# Patient Record
Sex: Female | Born: 1940 | Race: White | Hispanic: No | Marital: Married | Smoking: Former smoker
Health system: Southern US, Community
[De-identification: ages and names within clinical notes are randomized; demographics above are authoritative.]

## PROBLEM LIST (undated history)

## (undated) DIAGNOSIS — E7800 Pure hypercholesterolemia, unspecified: Secondary | ICD-10-CM

## (undated) DIAGNOSIS — N390 Urinary tract infection, site not specified: Secondary | ICD-10-CM

## (undated) DIAGNOSIS — R519 Headache, unspecified: Secondary | ICD-10-CM

## (undated) DIAGNOSIS — I447 Left bundle-branch block, unspecified: Secondary | ICD-10-CM

## (undated) DIAGNOSIS — E119 Type 2 diabetes mellitus without complications: Secondary | ICD-10-CM

## (undated) DIAGNOSIS — Z950 Presence of cardiac pacemaker: Secondary | ICD-10-CM

## (undated) DIAGNOSIS — E785 Hyperlipidemia, unspecified: Secondary | ICD-10-CM

## (undated) DIAGNOSIS — N189 Chronic kidney disease, unspecified: Secondary | ICD-10-CM

## (undated) DIAGNOSIS — K219 Gastro-esophageal reflux disease without esophagitis: Secondary | ICD-10-CM

## (undated) DIAGNOSIS — I480 Paroxysmal atrial fibrillation: Secondary | ICD-10-CM

## (undated) DIAGNOSIS — I10 Essential (primary) hypertension: Secondary | ICD-10-CM

## (undated) DIAGNOSIS — D0512 Intraductal carcinoma in situ of left breast: Secondary | ICD-10-CM

## (undated) HISTORY — DX: Intraductal carcinoma in situ of left breast: D05.12

## (undated) HISTORY — DX: Type 2 diabetes mellitus without complications: E11.9

## (undated) HISTORY — DX: Urinary tract infection, site not specified: N39.0

## (undated) HISTORY — DX: Left bundle-branch block, unspecified: I44.7

## (undated) HISTORY — DX: Pure hypercholesterolemia, unspecified: E78.00

## (undated) HISTORY — PX: CATARACT EXTRACTION: SUR2

## (undated) HISTORY — DX: Presence of cardiac pacemaker: Z95.0

## (undated) HISTORY — PX: BREAST EXCISIONAL BIOPSY: SUR124

## (undated) HISTORY — DX: Hyperlipidemia, unspecified: E78.5

## (undated) HISTORY — DX: Headache, unspecified: R51.9

## (undated) HISTORY — PX: BREAST LUMPECTOMY: SHX2

## (undated) HISTORY — DX: Chronic kidney disease, unspecified: N18.9

## (undated) HISTORY — PX: ABDOMINAL HYSTERECTOMY: SHX81

## (undated) HISTORY — DX: Essential (primary) hypertension: I10

## (undated) HISTORY — PX: OTHER SURGICAL HISTORY: SHX169

---

## 1999-11-10 ENCOUNTER — Encounter: Admission: RE | Admit: 1999-11-10 | Discharge: 1999-11-10 | Payer: Self-pay | Admitting: Family Medicine

## 1999-11-10 ENCOUNTER — Encounter: Payer: Self-pay | Admitting: Family Medicine

## 2000-11-12 ENCOUNTER — Encounter: Admission: RE | Admit: 2000-11-12 | Discharge: 2000-11-12 | Payer: Self-pay | Admitting: Family Medicine

## 2000-11-12 ENCOUNTER — Encounter: Payer: Self-pay | Admitting: Family Medicine

## 2004-08-14 ENCOUNTER — Other Ambulatory Visit: Admission: RE | Admit: 2004-08-14 | Discharge: 2004-08-14 | Payer: Self-pay | Admitting: Family Medicine

## 2005-04-06 ENCOUNTER — Encounter: Admission: RE | Admit: 2005-04-06 | Discharge: 2005-04-06 | Payer: Self-pay | Admitting: Family Medicine

## 2006-04-10 ENCOUNTER — Encounter: Admission: RE | Admit: 2006-04-10 | Discharge: 2006-04-10 | Payer: Self-pay | Admitting: Family Medicine

## 2007-04-14 ENCOUNTER — Encounter: Admission: RE | Admit: 2007-04-14 | Discharge: 2007-04-14 | Payer: Self-pay | Admitting: Family Medicine

## 2007-12-15 ENCOUNTER — Emergency Department (HOSPITAL_COMMUNITY): Admission: EM | Admit: 2007-12-15 | Discharge: 2007-12-15 | Payer: Self-pay | Admitting: Emergency Medicine

## 2008-04-14 ENCOUNTER — Encounter: Admission: RE | Admit: 2008-04-14 | Discharge: 2008-04-14 | Payer: Self-pay | Admitting: Family Medicine

## 2009-04-18 ENCOUNTER — Encounter: Admission: RE | Admit: 2009-04-18 | Discharge: 2009-04-18 | Payer: Self-pay | Admitting: Family Medicine

## 2010-04-19 ENCOUNTER — Encounter: Admission: RE | Admit: 2010-04-19 | Discharge: 2010-04-19 | Payer: Self-pay | Admitting: Family Medicine

## 2011-03-19 ENCOUNTER — Other Ambulatory Visit: Payer: Self-pay | Admitting: Family Medicine

## 2011-03-19 DIAGNOSIS — Z1231 Encounter for screening mammogram for malignant neoplasm of breast: Secondary | ICD-10-CM

## 2011-04-30 ENCOUNTER — Ambulatory Visit
Admission: RE | Admit: 2011-04-30 | Discharge: 2011-04-30 | Disposition: A | Discharge status: Home / Self Care | Payer: Medicare Other | Source: Ambulatory Visit | Attending: Family Medicine | Admitting: Family Medicine

## 2011-04-30 DIAGNOSIS — Z1231 Encounter for screening mammogram for malignant neoplasm of breast: Secondary | ICD-10-CM

## 2012-04-01 ENCOUNTER — Other Ambulatory Visit: Payer: Self-pay | Admitting: Family Medicine

## 2012-04-01 DIAGNOSIS — Z1231 Encounter for screening mammogram for malignant neoplasm of breast: Secondary | ICD-10-CM

## 2012-04-01 DIAGNOSIS — Z853 Personal history of malignant neoplasm of breast: Secondary | ICD-10-CM

## 2012-04-30 ENCOUNTER — Ambulatory Visit
Admission: RE | Admit: 2012-04-30 | Discharge: 2012-04-30 | Disposition: A | Discharge status: Home / Self Care | Payer: Medicare Other | Source: Ambulatory Visit | Attending: Family Medicine | Admitting: Family Medicine

## 2012-04-30 DIAGNOSIS — Z853 Personal history of malignant neoplasm of breast: Secondary | ICD-10-CM

## 2012-04-30 DIAGNOSIS — Z1231 Encounter for screening mammogram for malignant neoplasm of breast: Secondary | ICD-10-CM

## 2013-03-24 ENCOUNTER — Other Ambulatory Visit: Payer: Self-pay

## 2013-03-24 DIAGNOSIS — Z1231 Encounter for screening mammogram for malignant neoplasm of breast: Secondary | ICD-10-CM

## 2013-05-01 ENCOUNTER — Ambulatory Visit
Admission: RE | Admit: 2013-05-01 | Discharge: 2013-05-01 | Disposition: A | Discharge status: Home / Self Care | Payer: Medicare Other | Source: Ambulatory Visit

## 2013-05-01 DIAGNOSIS — Z1231 Encounter for screening mammogram for malignant neoplasm of breast: Secondary | ICD-10-CM

## 2014-03-10 ENCOUNTER — Other Ambulatory Visit: Payer: Self-pay

## 2014-03-10 DIAGNOSIS — Z1231 Encounter for screening mammogram for malignant neoplasm of breast: Secondary | ICD-10-CM

## 2014-05-12 ENCOUNTER — Ambulatory Visit
Admission: RE | Admit: 2014-05-12 | Discharge: 2014-05-12 | Disposition: A | Discharge status: Home / Self Care | Payer: Commercial Managed Care - HMO | Source: Ambulatory Visit

## 2014-05-12 DIAGNOSIS — Z1231 Encounter for screening mammogram for malignant neoplasm of breast: Secondary | ICD-10-CM

## 2015-04-04 ENCOUNTER — Other Ambulatory Visit: Payer: Self-pay

## 2015-04-04 DIAGNOSIS — Z1231 Encounter for screening mammogram for malignant neoplasm of breast: Secondary | ICD-10-CM

## 2015-05-01 DIAGNOSIS — N342 Other urethritis: Secondary | ICD-10-CM | POA: Diagnosis not present

## 2015-05-01 DIAGNOSIS — R002 Palpitations: Secondary | ICD-10-CM | POA: Diagnosis not present

## 2015-05-01 DIAGNOSIS — R112 Nausea with vomiting, unspecified: Secondary | ICD-10-CM | POA: Diagnosis not present

## 2015-05-01 DIAGNOSIS — E785 Hyperlipidemia, unspecified: Secondary | ICD-10-CM | POA: Diagnosis not present

## 2015-05-01 DIAGNOSIS — N390 Urinary tract infection, site not specified: Secondary | ICD-10-CM | POA: Diagnosis not present

## 2015-05-01 DIAGNOSIS — I10 Essential (primary) hypertension: Secondary | ICD-10-CM | POA: Diagnosis not present

## 2015-05-16 ENCOUNTER — Ambulatory Visit
Admission: RE | Admit: 2015-05-16 | Discharge: 2015-05-16 | Disposition: A | Discharge status: Home / Self Care | Payer: Medicare Other | Source: Ambulatory Visit

## 2015-05-16 DIAGNOSIS — Z1231 Encounter for screening mammogram for malignant neoplasm of breast: Secondary | ICD-10-CM | POA: Diagnosis not present

## 2015-05-27 DIAGNOSIS — E782 Mixed hyperlipidemia: Secondary | ICD-10-CM | POA: Diagnosis not present

## 2015-05-27 DIAGNOSIS — I10 Essential (primary) hypertension: Secondary | ICD-10-CM | POA: Diagnosis not present

## 2015-05-27 DIAGNOSIS — Z23 Encounter for immunization: Secondary | ICD-10-CM | POA: Diagnosis not present

## 2015-05-27 DIAGNOSIS — M810 Age-related osteoporosis without current pathological fracture: Secondary | ICD-10-CM | POA: Diagnosis not present

## 2015-05-27 DIAGNOSIS — E559 Vitamin D deficiency, unspecified: Secondary | ICD-10-CM | POA: Diagnosis not present

## 2015-05-27 DIAGNOSIS — R7301 Impaired fasting glucose: Secondary | ICD-10-CM | POA: Diagnosis not present

## 2015-05-27 DIAGNOSIS — N390 Urinary tract infection, site not specified: Secondary | ICD-10-CM | POA: Diagnosis not present

## 2015-05-27 DIAGNOSIS — K219 Gastro-esophageal reflux disease without esophagitis: Secondary | ICD-10-CM | POA: Diagnosis not present

## 2015-06-28 DIAGNOSIS — M810 Age-related osteoporosis without current pathological fracture: Secondary | ICD-10-CM | POA: Diagnosis not present

## 2015-07-04 DIAGNOSIS — D3132 Benign neoplasm of left choroid: Secondary | ICD-10-CM | POA: Diagnosis not present

## 2015-07-04 DIAGNOSIS — H5203 Hypermetropia, bilateral: Secondary | ICD-10-CM | POA: Diagnosis not present

## 2015-07-04 DIAGNOSIS — H04121 Dry eye syndrome of right lacrimal gland: Secondary | ICD-10-CM | POA: Diagnosis not present

## 2015-07-04 DIAGNOSIS — H43813 Vitreous degeneration, bilateral: Secondary | ICD-10-CM | POA: Diagnosis not present

## 2016-04-09 ENCOUNTER — Other Ambulatory Visit: Payer: Self-pay | Admitting: Family Medicine

## 2016-04-09 DIAGNOSIS — Z1231 Encounter for screening mammogram for malignant neoplasm of breast: Secondary | ICD-10-CM

## 2016-04-17 DIAGNOSIS — R220 Localized swelling, mass and lump, head: Secondary | ICD-10-CM | POA: Diagnosis not present

## 2016-04-17 DIAGNOSIS — L7211 Pilar cyst: Secondary | ICD-10-CM | POA: Insufficient documentation

## 2016-05-21 ENCOUNTER — Ambulatory Visit
Admission: RE | Admit: 2016-05-21 | Discharge: 2016-05-21 | Disposition: A | Discharge status: Home / Self Care | Payer: Medicare Other | Source: Ambulatory Visit | Attending: Family Medicine | Admitting: Family Medicine

## 2016-05-21 DIAGNOSIS — Z1231 Encounter for screening mammogram for malignant neoplasm of breast: Secondary | ICD-10-CM

## 2016-05-22 DIAGNOSIS — L720 Epidermal cyst: Secondary | ICD-10-CM | POA: Diagnosis not present

## 2016-05-23 DIAGNOSIS — L720 Epidermal cyst: Secondary | ICD-10-CM | POA: Diagnosis not present

## 2016-05-23 DIAGNOSIS — L729 Follicular cyst of the skin and subcutaneous tissue, unspecified: Secondary | ICD-10-CM | POA: Diagnosis not present

## 2016-05-25 DIAGNOSIS — E559 Vitamin D deficiency, unspecified: Secondary | ICD-10-CM | POA: Diagnosis not present

## 2016-05-25 DIAGNOSIS — I10 Essential (primary) hypertension: Secondary | ICD-10-CM | POA: Diagnosis not present

## 2016-05-25 DIAGNOSIS — E782 Mixed hyperlipidemia: Secondary | ICD-10-CM | POA: Diagnosis not present

## 2016-05-25 DIAGNOSIS — M79671 Pain in right foot: Secondary | ICD-10-CM | POA: Diagnosis not present

## 2016-05-25 DIAGNOSIS — M810 Age-related osteoporosis without current pathological fracture: Secondary | ICD-10-CM | POA: Diagnosis not present

## 2016-05-25 DIAGNOSIS — R7301 Impaired fasting glucose: Secondary | ICD-10-CM | POA: Diagnosis not present

## 2016-05-25 DIAGNOSIS — Z23 Encounter for immunization: Secondary | ICD-10-CM | POA: Diagnosis not present

## 2016-05-25 DIAGNOSIS — K219 Gastro-esophageal reflux disease without esophagitis: Secondary | ICD-10-CM | POA: Diagnosis not present

## 2016-06-19 DIAGNOSIS — Z4802 Encounter for removal of sutures: Secondary | ICD-10-CM | POA: Diagnosis not present

## 2016-08-02 DIAGNOSIS — H524 Presbyopia: Secondary | ICD-10-CM | POA: Diagnosis not present

## 2016-08-02 DIAGNOSIS — H2513 Age-related nuclear cataract, bilateral: Secondary | ICD-10-CM | POA: Diagnosis not present

## 2016-08-02 DIAGNOSIS — D3132 Benign neoplasm of left choroid: Secondary | ICD-10-CM | POA: Diagnosis not present

## 2016-08-02 DIAGNOSIS — H5203 Hypermetropia, bilateral: Secondary | ICD-10-CM | POA: Diagnosis not present

## 2016-08-02 DIAGNOSIS — H52203 Unspecified astigmatism, bilateral: Secondary | ICD-10-CM | POA: Diagnosis not present

## 2016-10-02 DIAGNOSIS — B079 Viral wart, unspecified: Secondary | ICD-10-CM | POA: Diagnosis not present

## 2016-10-02 DIAGNOSIS — D489 Neoplasm of uncertain behavior, unspecified: Secondary | ICD-10-CM | POA: Diagnosis not present

## 2016-10-02 DIAGNOSIS — H61002 Unspecified perichondritis of left external ear: Secondary | ICD-10-CM | POA: Diagnosis not present

## 2016-10-02 DIAGNOSIS — L570 Actinic keratosis: Secondary | ICD-10-CM | POA: Diagnosis not present

## 2016-10-02 DIAGNOSIS — L821 Other seborrheic keratosis: Secondary | ICD-10-CM | POA: Diagnosis not present

## 2017-04-16 ENCOUNTER — Other Ambulatory Visit: Payer: Self-pay | Admitting: Family Medicine

## 2017-04-16 DIAGNOSIS — Z1231 Encounter for screening mammogram for malignant neoplasm of breast: Secondary | ICD-10-CM

## 2017-05-23 ENCOUNTER — Ambulatory Visit
Admission: RE | Admit: 2017-05-23 | Discharge: 2017-05-23 | Disposition: A | Discharge status: Home / Self Care | Payer: PRIVATE HEALTH INSURANCE | Source: Ambulatory Visit | Attending: Family Medicine | Admitting: Family Medicine

## 2017-05-23 DIAGNOSIS — Z1231 Encounter for screening mammogram for malignant neoplasm of breast: Secondary | ICD-10-CM | POA: Diagnosis not present

## 2017-05-29 DIAGNOSIS — E663 Overweight: Secondary | ICD-10-CM | POA: Diagnosis not present

## 2017-05-29 DIAGNOSIS — Z23 Encounter for immunization: Secondary | ICD-10-CM | POA: Diagnosis not present

## 2017-05-29 DIAGNOSIS — E782 Mixed hyperlipidemia: Secondary | ICD-10-CM | POA: Diagnosis not present

## 2017-05-29 DIAGNOSIS — K219 Gastro-esophageal reflux disease without esophagitis: Secondary | ICD-10-CM | POA: Diagnosis not present

## 2017-05-29 DIAGNOSIS — R7301 Impaired fasting glucose: Secondary | ICD-10-CM | POA: Diagnosis not present

## 2017-05-29 DIAGNOSIS — I10 Essential (primary) hypertension: Secondary | ICD-10-CM | POA: Diagnosis not present

## 2017-05-29 DIAGNOSIS — M810 Age-related osteoporosis without current pathological fracture: Secondary | ICD-10-CM | POA: Diagnosis not present

## 2017-05-29 DIAGNOSIS — E559 Vitamin D deficiency, unspecified: Secondary | ICD-10-CM | POA: Diagnosis not present

## 2017-05-29 DIAGNOSIS — Z1211 Encounter for screening for malignant neoplasm of colon: Secondary | ICD-10-CM | POA: Diagnosis not present

## 2017-07-08 DIAGNOSIS — M810 Age-related osteoporosis without current pathological fracture: Secondary | ICD-10-CM | POA: Diagnosis not present

## 2017-08-20 ENCOUNTER — Encounter (HOSPITAL_COMMUNITY): Payer: Self-pay

## 2017-08-20 ENCOUNTER — Emergency Department (HOSPITAL_COMMUNITY): Payer: Medicare Other

## 2017-08-20 ENCOUNTER — Other Ambulatory Visit: Payer: Self-pay

## 2017-08-20 ENCOUNTER — Emergency Department (HOSPITAL_COMMUNITY)
Admission: EM | Admit: 2017-08-20 | Discharge: 2017-08-20 | Disposition: A | Discharge status: Home / Self Care | Payer: Medicare Other | Attending: Emergency Medicine | Admitting: Emergency Medicine

## 2017-08-20 DIAGNOSIS — R0789 Other chest pain: Secondary | ICD-10-CM | POA: Diagnosis not present

## 2017-08-20 DIAGNOSIS — Z79899 Other long term (current) drug therapy: Secondary | ICD-10-CM | POA: Insufficient documentation

## 2017-08-20 DIAGNOSIS — Z7982 Long term (current) use of aspirin: Secondary | ICD-10-CM | POA: Diagnosis not present

## 2017-08-20 DIAGNOSIS — R079 Chest pain, unspecified: Secondary | ICD-10-CM | POA: Diagnosis not present

## 2017-08-20 HISTORY — DX: Gastro-esophageal reflux disease without esophagitis: K21.9

## 2017-08-20 HISTORY — DX: Left bundle-branch block, unspecified: I44.7

## 2017-08-20 LAB — BASIC METABOLIC PANEL
ANION GAP: 9 (ref 5–15)
BUN: 15 mg/dL (ref 6–20)
CALCIUM: 9.8 mg/dL (ref 8.9–10.3)
CHLORIDE: 104 mmol/L (ref 101–111)
CO2: 23 mmol/L (ref 22–32)
Creatinine, Ser: 0.8 mg/dL (ref 0.44–1.00)
GFR calc non Af Amer: >60 mL/min (ref >60)
Glucose, Bld: 104 mg/dL — ABNORMAL HIGH (ref 65–99)
Potassium: 3.5 mmol/L (ref 3.5–5.1)
Sodium: 136 mmol/L (ref 135–145)

## 2017-08-20 LAB — CBC
HCT: 37.7 % (ref 36.0–46.0)
HEMOGLOBIN: 12.3 g/dL (ref 12.0–15.0)
MCH: 26.1 pg (ref 26.0–34.0)
MCHC: 32.6 g/dL (ref 30.0–36.0)
MCV: 79.9 fL (ref 78.0–100.0)
Platelets: 266 10*3/uL (ref 150–400)
RBC: 4.72 MIL/uL (ref 3.87–5.11)
RDW: 14.1 % (ref 11.5–15.5)
WBC: 7.5 10*3/uL (ref 4.0–10.5)

## 2017-08-20 LAB — I-STAT TROPONIN, ED
TROPONIN I, POC: 0.01 ng/mL (ref 0.00–0.08)
TROPONIN I, POC: 0.01 ng/mL (ref 0.00–0.08)

## 2017-08-20 MED ORDER — PANTOPRAZOLE SODIUM 20 MG PO TBEC: 20.0000 mg | DELAYED_RELEASE_TABLET | Freq: Every day | ORAL | 1 refills | Status: DC

## 2017-08-20 MED ORDER — PANTOPRAZOLE SODIUM 40 MG PO TBEC
40.0000 mg | DELAYED_RELEASE_TABLET | Freq: Once | ORAL | Status: AC
  Administered 2017-08-20: 40 mg via ORAL
  Filled 2017-08-20: qty 1

## 2017-08-20 MED ORDER — FAMOTIDINE 20 MG PO TABS
20.0000 mg | ORAL_TABLET | Freq: Once | ORAL | Status: AC
  Administered 2017-08-20: 20 mg via ORAL
  Filled 2017-08-20: qty 1

## 2017-08-20 MED ORDER — FAMOTIDINE 20 MG PO TABS: 20.0000 mg | ORAL_TABLET | Freq: Two times a day (BID) | ORAL | 0 refills | Status: DC

## 2017-08-20 NOTE — ED Triage Notes (Signed)
Pt states that since Saturday she has been having central chest burning with radiation to left area, nausea and some SOB. Hx of GERD

## 2017-08-20 NOTE — Discharge Instructions (Signed)
Your lab results today were encouraging.  Your symptoms may be due to acid reflux. Pantoprazole: Take the pantoprazole daily.  Take this medication 10-20 minutes prior to your first meal of the day. Pepcid: Take this medication twice a day for the next 5 days. Blood pressure: Your blood pressure was higher than normal today.  Please follow-up with your primary care provider on this matter. Follow-up: Follow-up with the gastroenterologist on this matter.  Please also follow-up with cardiology. Return: Return to the ED for any changing or worsening symptoms.

## 2017-08-20 NOTE — ED Provider Notes (Signed)
[facility] EMERGENCY DEPARTMENT Provider Note   CSN: [id number] Arrival date & time: 08/20/17  3557     History   Chief Complaint Chief Complaint  Patient presents with  . Chest Pain    HPI Lisa [name] is a 76 y.o. female.  HPI   Lisa [name] is a 76 y.o. female, with a history of GERD and LBBB, presenting to the ED with epigastric pain and burning in the chest beginning November 24.  Patient states she awoke with a burning sensation accompanied by belching and nausea.  Symptoms last for 1-2 minutes at a time and then resolved.  Eating resolves the symptoms.  Rates the discomfort 2/10. States she has done yard work over the weekend as well as yesterday without onset of symptoms.  She states that when she gets the burning in her chest she becomes worried and that makes her heart feel like it is racing.  She states she feels anxious at that time.  All symptoms resolve completely.    Nervous about her blood pressure. Usually runs around 322 systolic. BP medication increased in Sept 2018. 40mg  losartan increased to 100mg .   Denies cough, fever, shortness of breath, vomiting/diarrhea, hematochezia/melena, or any other complaints.  Accompanied by her husband at the bedside.   Past Medical History:  Diagnosis Date  . Bundle branch block, left   . GERD (gastroesophageal reflux disease)     There are no active problems to display for this patient.   Past Surgical History:  Procedure Laterality Date  . BREAST LUMPECTOMY Left    No Visible Scar, said 30 + years ago in situ, no chemo, no radiation, or hormone replacement      OB History    No data available       Home Medications    Prior to Admission medications   Medication Sig Start Date End Date Taking? Authorizing Provider  amLODipine (NORVASC) 2.5 MG tablet Take 2.5 mg by mouth daily. 08/13/17  Yes [provider]  aspirin 81 MG chewable tablet Chew 81 mg by mouth daily.   Yes  [provider]  calcium carbonate (TUMS - DOSED IN MG ELEMENTAL CALCIUM) 500 MG chewable tablet Chew 1 tablet by mouth daily as needed for indigestion or heartburn.   Yes [provider]  Ergocalciferol (VITAMIN D2) 2000 units TABS Take 2,000 Units by mouth daily.   Yes [provider]  losartan-hydrochlorothiazide (HYZAAR) 100-12.5 MG tablet Take 1 tablet by mouth daily. 08/13/17  Yes [provider]  omeprazole (PRILOSEC) 20 MG capsule Take 20 mg by mouth daily as needed (heartburn).   Yes [provider]  simvastatin (ZOCOR) 40 MG tablet Take 40 mg by mouth daily. 08/13/17  Yes [provider]  famotidine (PEPCID) 20 MG tablet Take 1 tablet (20 mg total) by mouth 2 (two) times daily for 5 days. 08/20/17 08/25/17  Joy, Shawn C, PA-C  pantoprazole (PROTONIX) 20 MG tablet Take 1 tablet (20 mg total) by mouth daily. 08/20/17 10/19/17  Lorayne Bender, PA-C    Family History Family History  Problem Relation Age of Onset  . Breast cancer Mother     Social History Social History   Tobacco Use  . Smoking status: Never Smoker  . Smokeless tobacco: Never Used  Substance Use Topics  . Alcohol use: No    Frequency: Never  . Drug use: No     Allergies   Codeine   Review of Systems Review  of Systems  Constitutional: Negative for chills and fever.  Respiratory: Negative for cough and shortness of breath.   Cardiovascular: Negative for leg swelling.  Gastrointestinal: Positive for nausea. Negative for blood in stool.       Burning in the lower chest and epigastrium  Neurological: Negative for dizziness, syncope, weakness, light-headedness and numbness.  All other systems reviewed and are negative.    Physical Exam Updated Vital Signs BP (!) 167/71 (BP Location: Left Arm)   Pulse 62   Temp 97.6 F (36.4 C) (Oral)   Resp 16   Ht 5\' 5"  (1.651 m)   Wt 76.7 kg (169 lb)   SpO2 96%   BMI 28.12 kg/m   Physical Exam    Constitutional: She appears well-developed and well-nourished. No distress.  HENT:  Head: Normocephalic and atraumatic.  Eyes: Conjunctivae are normal.  Neck: Neck supple.  Cardiovascular: Normal rate, regular rhythm, normal heart sounds and intact distal pulses.  Pulmonary/Chest: Effort normal and breath sounds normal. No respiratory distress.  Abdominal: Soft. There is tenderness. There is no guarding.  Verbally indicates some minimal tenderness in epigastrium.  Musculoskeletal: She exhibits no edema.  Lymphadenopathy:    She has no cervical adenopathy.  Neurological: She is alert.  Skin: Skin is warm and dry. Capillary refill takes less than 2 seconds. She is not diaphoretic.  Psychiatric: She has a normal mood and affect. Her behavior is normal.  Nursing note and vitals reviewed.    ED Treatments / Results  Labs (all labs ordered are listed, but only abnormal results are displayed) Labs Reviewed  BASIC METABOLIC PANEL - Abnormal; Notable for the following components:      Result Value   Glucose, Bld 104 (*)    All other components within normal limits  CBC  I-STAT TROPONIN, ED  I-STAT TROPONIN, ED    EKG  EKG Interpretation  Date/Time:  Tuesday August 20 2017 06:22:35 EST Ventricular Rate:  69 PR Interval:  154 QRS Duration: 146 QT Interval:  430 QTC Calculation: 460 R Axis:   -37 Text Interpretation:  Sinus rhythm with Premature atrial complexes Left axis deviation Left bundle branch block Abnormal ECG No old tracing to compare Confirmed by Delora Fuel ([id number]) on 08/20/2017 6:45:43 AM       Radiology Dg Chest 2 View  Result Date: 08/20/2017 CLINICAL DATA:  Chest pain. EXAM: CHEST  2 VIEW COMPARISON:  Chest and rib radiographs 12/15/2007 FINDINGS: The cardiomediastinal contours are normal. Aortic arch atherosclerosis. Interstitial and bronchial thickening. No consolidation, pleural effusion, or pneumothorax. No acute osseous abnormalities are seen.  IMPRESSION: Interstitial and bronchial thickening which may be chronic or secondary to acute bronchitis. Pulmonary edema felt less likely. Electronically Signed   By: Jeb Levering M.D.   On: 08/20/2017 06:47    Procedures Procedures (including critical care time)  Medications Ordered in ED Medications  pantoprazole (PROTONIX) EC tablet 40 mg (40 mg Oral Given 08/20/17 1128)  famotidine (PEPCID) tablet 20 mg (20 mg Oral Given 08/20/17 1128)     Initial Impression / Assessment and Plan / ED Course  I have reviewed the triage vital signs and the nursing notes.  Pertinent labs & imaging results that were available during my care of the patient were reviewed by me and considered in my medical decision making (see chart for details).     Patient presents with complaint of lower chest and epigastric burning.  Resolves with eating.  Atypical chest pain.  Low suspicion for ACS.  HEART score of 3 (LBBB not new, noted on EKG read from 2016). Delta troponins negative.  GI and cardiology follow-up. The patient was given instructions for home care as well as strict return precautions. Patient voices understanding of these instructions, accepts the plan, and is comfortable with discharge.  Findings and plan of care discussed with Lajean Saver, MD.   Vitals:   08/20/17 1030 08/20/17 1045 08/20/17 1100 08/20/17 1115  BP: 97/83  (!) 152/65 (!) 173/80  Pulse: 76 (!) 58 (!) 58 70  Resp: 13 10 14 12   Temp:      TempSrc:      SpO2: 95% 94% 95% 95%  Weight:      Height:         Final Clinical Impressions(s) / ED Diagnoses   Final diagnoses:  Burning chest pain    ED Discharge Orders        Ordered    pantoprazole (PROTONIX) 20 MG tablet  Daily     08/20/17 1125    famotidine (PEPCID) 20 MG tablet  2 times daily     08/20/17 Dyer, [place], PA-C 08/20/17 1402    Lajean Saver, MD 08/21/17 [phone]

## 2017-09-04 ENCOUNTER — Telehealth: Payer: Self-pay | Admitting: *Deleted

## 2017-09-04 DIAGNOSIS — R079 Chest pain, unspecified: Secondary | ICD-10-CM | POA: Diagnosis not present

## 2017-09-04 DIAGNOSIS — I10 Essential (primary) hypertension: Secondary | ICD-10-CM | POA: Diagnosis not present

## 2017-09-04 DIAGNOSIS — E663 Overweight: Secondary | ICD-10-CM | POA: Diagnosis not present

## 2017-09-04 NOTE — Telephone Encounter (Signed)
NOTES SENT TO SCHEDULING.  °

## 2017-09-23 ENCOUNTER — Encounter: Payer: Self-pay | Admitting: *Deleted

## 2017-09-23 DIAGNOSIS — K219 Gastro-esophageal reflux disease without esophagitis: Secondary | ICD-10-CM | POA: Insufficient documentation

## 2017-09-23 DIAGNOSIS — I447 Left bundle-branch block, unspecified: Secondary | ICD-10-CM | POA: Insufficient documentation

## 2017-10-14 NOTE — Progress Notes (Signed)
Cardiology Office Note   Date:  10/18/2017   ID:  Lisa [name], DOB 1941-06-27, MRN [id number]  PCP:  Hulan Fess, MD  Cardiologist:   Jiah Bari [name], MD   Chief Complaint  Patient presents with  . Chest Pain  . Hypertension      History of Present Illness: Lisa [name] is a 77 y.o. female who is seen at the request of Dr. Rex Kras for evaluation of chest pain. She has a history of HTN, HLD, and LBBB. She was seen in the ED on 08/20/17 with acute chest pain. She complained of a burning mid sternal chest pain radiating to her left arm. Associated with some neck discomfort and prominent belching. Ecg showed a chronic LBBB. troponins were negative. She was hypertensive. Started on anti reflux therapy with improvement in symptoms but she is still having intermittent burning and belching. Gets more relief with taking TUMS. She is very active and denies symptoms with exertion. She was seen by me in 2006. Had LBBB then. Echo and Adenosine Myoview were normal. Recently BP medication increased with addition of amlodipine and increase in losartan. By home BP readings BP 112-144/62-78.     Past Medical History:  Diagnosis Date  . Bundle branch block, left   . Dyslipidemia   . GERD (gastroesophageal reflux disease)   . Hypercholesteremia   . Hypertension   . LBBB (left bundle branch block)   . UTI (urinary tract infection)     Past Surgical History:  Procedure Laterality Date  . bladder polyp removal    . BREAST LUMPECTOMY Left    No Visible Scar, said 30 + years ago in situ, no chemo, no radiation, or hormone replacement    . CESAREAN SECTION       Current Outpatient Medications  Medication Sig Dispense Refill  . amLODipine (NORVASC) 5 MG tablet Take 5 mg by mouth daily.    . ranitidine (ZANTAC) 150 MG tablet Take 150 mg by mouth 2 (two) times daily.    Marland Kitchen aspirin 81 MG chewable tablet Chew 81 mg by mouth daily.    . calcium carbonate (TUMS - DOSED IN MG ELEMENTAL CALCIUM)  500 MG chewable tablet Chew 1 tablet by mouth daily as needed for indigestion or heartburn.    . Ergocalciferol (VITAMIN D2) 2000 units TABS Take 2,000 Units by mouth daily.    Marland Kitchen losartan-hydrochlorothiazide (HYZAAR) 100-12.5 MG tablet Take 1 tablet by mouth daily.    . simvastatin (ZOCOR) 40 MG tablet Take 40 mg by mouth daily.     No current facility-administered medications for this visit.     Allergies:   Benicar [olmesartan]; Codeine; Fosamax [alendronate sodium]; Lisinopril; and Norvasc [amlodipine]    Social History:  The patient  reports that she quit smoking about 26 years ago. She quit after 20.00 years of use. she has never used smokeless tobacco. She reports that she drinks alcohol. She reports that she does not use drugs.   Family History:  The patient's family history includes Breast cancer in her mother; CVA in her brother and brother; Crohn's disease in her brother; Diabetes in her brother; Hip fracture in her mother; Other in her brother; Renal cancer in her brother; Ulcers in her father.    ROS:  Please see the history of present illness.   Otherwise, review of systems are positive for none.   All other systems are reviewed and negative.    PHYSICAL EXAM: VS:  BP (!) 188/92 (BP Location:  Left Arm, Patient Position: Sitting, Cuff Size: Normal)   Pulse (!) 59   Ht 5\' 5"  (1.651 m)   Wt 167 lb (75.8 kg)   BMI 27.79 kg/m  , BMI Body mass index is 27.79 kg/m. GEN: Well nourished, well developed, in no acute distress  HEENT: normal  Neck: no JVD, carotid bruits, or masses Cardiac: RRR; no murmurs, rubs, or gallops,no edema  Respiratory:  clear to auscultation bilaterally, normal work of breathing GI: soft, nontender, nondistended, + BS MS: no deformity or atrophy  Skin: warm and dry, no rash Neuro:  Strength and sensation are intact Psych: euthymic mood, full affect   EKG:  EKG is ordered today. The ekg ordered today demonstrates NSR with PACs. LBBB. Rate 59. I have  personally reviewed and interpreted this study.    Recent Labs: 08/20/2017: BUN 15; Creatinine, Ser 0.80; Hemoglobin 12.3; Platelets 266; Potassium 3.5; Sodium 136    Lipid Panel No results found for: CHOL, TRIG, HDL, CHOLHDL, VLDL, LDLCALC, LDLDIRECT    Wt Readings from Last 3 Encounters:  10/18/17 167 lb (75.8 kg)  08/20/17 169 lb (76.7 kg)      Other studies Reviewed: Additional studies/ records that were reviewed today include:  Labs dated 05/29/17: cholesterol 163, triglycerides 74, HDL 63, LDL 85. LFTs and TSH normal    ASSESSMENT AND PLAN:  1. Chest pain with atypical features. History more consistent with GERD. She is at moderate CV risk. Recommend Lexiscan Myoview study to rule out CAD as cause of her pain.  2. HTN. Elevated today but home BP recordings this past month look quite good. Will monitor for now. 3. Hypercholesterolemia. On statin 4. Chronic LBBB.   Current medicines are reviewed at length with the patient today.  The patient does not have concerns regarding medicines.  The following changes have been made:  no change  Labs/ tests ordered today include:   Orders Placed This Encounter  Procedures  . Myocardial Perfusion Imaging     Disposition:   FU with me TBA   Signed, Tharun Cappella [name], MD , [facility] 10/18/2017 8:59 AM    [facility] [address] [place], [place], [address] Phone [phone], Fax [phone]

## 2017-10-18 ENCOUNTER — Telehealth (HOSPITAL_COMMUNITY): Payer: Self-pay

## 2017-10-18 ENCOUNTER — Encounter: Payer: Self-pay | Admitting: Cardiology

## 2017-10-18 ENCOUNTER — Ambulatory Visit (INDEPENDENT_AMBULATORY_CARE_PROVIDER_SITE_OTHER): Payer: Medicare Other | Admitting: Cardiology

## 2017-10-18 ENCOUNTER — Encounter (INDEPENDENT_AMBULATORY_CARE_PROVIDER_SITE_OTHER): Payer: Self-pay

## 2017-10-18 VITALS — BP 188/92 | HR 59 | Ht 65.0 in | Wt 167.0 lb

## 2017-10-18 DIAGNOSIS — E7800 Pure hypercholesterolemia, unspecified: Secondary | ICD-10-CM | POA: Diagnosis not present

## 2017-10-18 DIAGNOSIS — I447 Left bundle-branch block, unspecified: Secondary | ICD-10-CM

## 2017-10-18 DIAGNOSIS — R079 Chest pain, unspecified: Secondary | ICD-10-CM

## 2017-10-18 DIAGNOSIS — I10 Essential (primary) hypertension: Secondary | ICD-10-CM | POA: Diagnosis not present

## 2017-10-18 NOTE — Patient Instructions (Signed)
Schedule Lexiscan Myoview. 

## 2017-10-18 NOTE — Addendum Note (Signed)
Addended by: Kathyrn Lass on: 10/18/2017 09:03 AM   Modules accepted: Orders

## 2017-10-18 NOTE — Telephone Encounter (Signed)
Encounter complete. 

## 2017-10-23 ENCOUNTER — Ambulatory Visit (HOSPITAL_COMMUNITY)
Admission: RE | Admit: 2017-10-23 | Discharge: 2017-10-23 | Disposition: A | Discharge status: Home / Self Care | Payer: Medicare Other | Source: Ambulatory Visit | Attending: Cardiovascular Disease | Admitting: Cardiovascular Disease

## 2017-10-23 DIAGNOSIS — R079 Chest pain, unspecified: Secondary | ICD-10-CM

## 2017-10-23 DIAGNOSIS — I447 Left bundle-branch block, unspecified: Secondary | ICD-10-CM

## 2017-10-23 DIAGNOSIS — I10 Essential (primary) hypertension: Secondary | ICD-10-CM | POA: Diagnosis not present

## 2017-10-23 DIAGNOSIS — E7800 Pure hypercholesterolemia, unspecified: Secondary | ICD-10-CM | POA: Diagnosis not present

## 2017-10-23 LAB — MYOCARDIAL PERFUSION IMAGING
CHL CUP NUCLEAR SDS: 0
CHL CUP NUCLEAR SRS: 3
CSEPPHR: 96 {beats}/min
LV dias vol: 108 mL (ref 46–106)
LV sys vol: 52 mL
Rest HR: 59 {beats}/min
SSS: 3
TID: 1.05

## 2017-10-23 MED ORDER — TECHNETIUM TC 99M TETROFOSMIN IV KIT
32.2000 | PACK | Freq: Once | INTRAVENOUS | Status: AC | PRN
  Administered 2017-10-23: 32.2 via INTRAVENOUS
  Filled 2017-10-23: qty 33

## 2017-10-23 MED ORDER — REGADENOSON 0.4 MG/5ML IV SOLN
0.4000 mg | Freq: Once | INTRAVENOUS | Status: AC
  Administered 2017-10-23: 0.4 mg via INTRAVENOUS

## 2017-10-23 MED ORDER — TECHNETIUM TC 99M TETROFOSMIN IV KIT
10.5000 | PACK | Freq: Once | INTRAVENOUS | Status: AC | PRN
  Administered 2017-10-23: 10.5 via INTRAVENOUS
  Filled 2017-10-23: qty 11

## 2018-04-10 ENCOUNTER — Other Ambulatory Visit: Payer: Self-pay | Admitting: Family Medicine

## 2018-04-10 DIAGNOSIS — Z1231 Encounter for screening mammogram for malignant neoplasm of breast: Secondary | ICD-10-CM

## 2018-05-27 ENCOUNTER — Ambulatory Visit: Payer: Self-pay

## 2018-05-29 DIAGNOSIS — K219 Gastro-esophageal reflux disease without esophagitis: Secondary | ICD-10-CM | POA: Diagnosis not present

## 2018-05-29 DIAGNOSIS — I10 Essential (primary) hypertension: Secondary | ICD-10-CM | POA: Diagnosis not present

## 2018-05-29 DIAGNOSIS — R0609 Other forms of dyspnea: Secondary | ICD-10-CM | POA: Diagnosis not present

## 2018-05-29 DIAGNOSIS — I4891 Unspecified atrial fibrillation: Secondary | ICD-10-CM | POA: Diagnosis not present

## 2018-05-29 DIAGNOSIS — R7301 Impaired fasting glucose: Secondary | ICD-10-CM | POA: Diagnosis not present

## 2018-05-29 DIAGNOSIS — M810 Age-related osteoporosis without current pathological fracture: Secondary | ICD-10-CM | POA: Diagnosis not present

## 2018-05-29 DIAGNOSIS — E782 Mixed hyperlipidemia: Secondary | ICD-10-CM | POA: Diagnosis not present

## 2018-06-02 NOTE — Progress Notes (Signed)
Cardiology Office Note   Date:  06/05/2018   ID:  Lisa [name], DOB [birth year], MRN [id number]  PCP:  Hulan Fess, MD  Cardiologist:   Peter [name], MD   Chief Complaint  Patient presents with  . office visit    dr.little did EKG, AFIB seen. was placed on eliquis   . Atrial Fibrillation      History of Present Illness: Lisa [name] is a 77 y.o. female who is seen at the request of Dr. Nicholes Stairs  for new onset Afib.  She has a history of HTN, HLD, and LBBB. Troponins were negative. She was hypertensive. Started on anti reflux therapy with improvement in symptoms. Subsequent Myoview in January 2019 was normal. Prior  Echo and Adenosine Myoview in 2006 were normal.   About 3 weeks ago she noted sudden onset of SOB. Noted some swelling. Did not really feel palpitations. She initially had bad indigestion but this resolved. She was seen by Dr. Rex Kras September 5 and found to be in AFib. Rate 97. Started on Eliquis, metoprolol and lasix. Since then she has felt much better with resolution of dyspnea. She notes BP has been well controlled this past year. No history of CVA or TIA. No bleeding issues.     Past Medical History:  Diagnosis Date  . Bundle branch block, left   . Dyslipidemia   . GERD (gastroesophageal reflux disease)   . Hypercholesteremia   . Hypertension   . LBBB (left bundle branch block)   . UTI (urinary tract infection)     Past Surgical History:  Procedure Laterality Date  . bladder polyp removal    . BREAST LUMPECTOMY Left    No Visible Scar, said 30 + years ago in situ, no chemo, no radiation, or hormone replacement    . CESAREAN SECTION       Current Outpatient Medications  Medication Sig Dispense Refill  . apixaban (ELIQUIS) 5 MG TABS tablet Take 5 mg by mouth 2 (two) times daily.    Marland Kitchen amLODipine (NORVASC) 5 MG tablet Take 5 mg by mouth daily.    Marland Kitchen aspirin 81 MG chewable tablet Chew 81 mg by mouth daily.    . calcium carbonate (TUMS - DOSED IN MG  ELEMENTAL CALCIUM) 500 MG chewable tablet Chew 1 tablet by mouth daily as needed for indigestion or heartburn.    . Ergocalciferol (VITAMIN D2) 2000 units TABS Take 2,000 Units by mouth daily.    . furosemide (LASIX) 20 MG tablet TAKE 1 TABLET BY MOUTH EVERY DAY IN THE MORNING  0  . losartan-hydrochlorothiazide (HYZAAR) 100-12.5 MG tablet Take 1 tablet by mouth daily.    . metoprolol tartrate (LOPRESSOR) 25 MG tablet Take 25 mg by mouth 2 (two) times daily with a meal.  1  . ranitidine (ZANTAC) 150 MG tablet Take 150 mg by mouth 2 (two) times daily.    . simvastatin (ZOCOR) 40 MG tablet Take 40 mg by mouth daily.     No current facility-administered medications for this visit.     Allergies:   Benicar [olmesartan]; Codeine; Fosamax [alendronate sodium]; Lisinopril; and Norvasc [amlodipine]    Social History:  The patient  reports that she quit smoking about 26 years ago. She quit after 20.00 years of use. She has never used smokeless tobacco. She reports that she drinks alcohol. She reports that she does not use drugs.   Family History:  The patient's family history includes Breast cancer in her mother; CVA  in her brother and brother; Crohn's disease in her brother; Diabetes in her brother; Hip fracture in her mother; Other in her brother; Renal cancer in her brother; Ulcers in her father.    ROS:  Please see the history of present illness.   Otherwise, review of systems are positive for none.   All other systems are reviewed and negative.    PHYSICAL EXAM: VS:  BP 138/82 (BP Location: Right Arm, Patient Position: Sitting)   Pulse 77   Ht 5\' 5"  (1.651 m)   Wt 160 lb 6.4 oz (72.8 kg)   BMI 26.69 kg/m  , BMI Body mass index is 26.69 kg/m. GENERAL:  Well appearing WF in NAD HEENT:  PERRL, EOMI, sclera are clear. Oropharynx is clear. NECK:  No jugular venous distention, carotid upstroke brisk and symmetric, no bruits, no thyromegaly or adenopathy LUNGS:  Clear to auscultation  bilaterally CHEST:  Unremarkable HEART:  IRRR,  PMI not displaced or sustained,S1 and S2 within normal limits, no S3, no S4: no clicks, no rubs, no murmurs ABD:  Soft, nontender. BS +, no masses or bruits. No hepatomegaly, no splenomegaly EXT:  2 + pulses throughout, no edema, no cyanosis no clubbing SKIN:  Warm and dry.  No rashes NEURO:  Alert and oriented x 3. Cranial nerves II through XII intact. PSYCH:  Cognitively intact     EKG:  EKG is ordered today. The ekg ordered today demonstrates Afib rate 77. LBBB. I have personally reviewed and interpreted this study.    Recent Labs: 08/20/2017: BUN 15; Creatinine, Ser 0.80; Hemoglobin 12.3; Platelets 266; Potassium 3.5; Sodium 136    Lipid Panel No results found for: CHOL, TRIG, HDL, CHOLHDL, VLDL, LDLCALC, LDLDIRECT    Wt Readings from Last 3 Encounters:  06/05/18 160 lb 6.4 oz (72.8 kg)  10/23/17 167 lb (75.8 kg)  10/18/17 167 lb (75.8 kg)      Other studies Reviewed: Additional studies/ records that were reviewed today include:  Labs dated 05/29/17: cholesterol 163, triglycerides 74, HDL 63, LDL 85. LFTs and TSH normal Dated 05/29/18: cholesterol 129, triglycerides 82, HDL 55, LDL 58. Normal CBC, Chemistries, TSH. BNP 143.   Myoview 10/23/17: Study Highlights    Nuclear stress EF: 51%.  The left ventricular ejection fraction is mildly decreased (45-54%).  There was no ST segment deviation noted during stress.  Defect 1: There is a small defect of moderate severity present in the mid anterior location. This is due to breast attenuation artifact.  This is a low risk study.  Septal wall hypokinesis consistent with left bundle branch block.     ASSESSMENT AND PLAN:  1. Atrial fibrillation persistent. Rate now well Controlled on metoprolol. [place] Vasc score of 3. Now on Eliquis at appropriate dose. Symptoms improved with medical therapy. Labs OK. Will check an Echocardiogram. Plan follow up in 4 weeks. If still in Afib  we will consider elective DCCV.  2. HTN. Well controlled. 3. Hypercholesterolemia. On statin- excellent control 4. Chronic LBBB.- normal Myoview in January 2019.     Signed, Peter [name], MD , [facility] 06/05/2018 3:06 PM    [facility] [address] [place], [place], [address] Phone [phone], Fax [phone]

## 2018-06-05 ENCOUNTER — Encounter: Payer: Self-pay | Admitting: Cardiology

## 2018-06-05 ENCOUNTER — Ambulatory Visit (INDEPENDENT_AMBULATORY_CARE_PROVIDER_SITE_OTHER): Payer: Medicare Other | Admitting: Cardiology

## 2018-06-05 VITALS — BP 138/82 | HR 77 | Ht 65.0 in | Wt 160.4 lb

## 2018-06-05 DIAGNOSIS — I481 Persistent atrial fibrillation: Secondary | ICD-10-CM | POA: Diagnosis not present

## 2018-06-05 DIAGNOSIS — I10 Essential (primary) hypertension: Secondary | ICD-10-CM | POA: Diagnosis not present

## 2018-06-05 DIAGNOSIS — I4819 Other persistent atrial fibrillation: Secondary | ICD-10-CM

## 2018-06-05 DIAGNOSIS — I447 Left bundle-branch block, unspecified: Secondary | ICD-10-CM | POA: Diagnosis not present

## 2018-06-05 DIAGNOSIS — E7800 Pure hypercholesterolemia, unspecified: Secondary | ICD-10-CM | POA: Diagnosis not present

## 2018-06-05 NOTE — Patient Instructions (Signed)
Continue your current therapy  We will schedule you for an Echocardiogram  I will see you in 4 weeks.

## 2018-06-09 ENCOUNTER — Other Ambulatory Visit: Payer: Self-pay

## 2018-06-09 ENCOUNTER — Ambulatory Visit (HOSPITAL_COMMUNITY): Payer: Medicare Other | Attending: Cardiology

## 2018-06-09 DIAGNOSIS — I10 Essential (primary) hypertension: Secondary | ICD-10-CM | POA: Diagnosis not present

## 2018-06-09 DIAGNOSIS — E7800 Pure hypercholesterolemia, unspecified: Secondary | ICD-10-CM | POA: Diagnosis not present

## 2018-06-09 DIAGNOSIS — H52203 Unspecified astigmatism, bilateral: Secondary | ICD-10-CM | POA: Diagnosis not present

## 2018-06-09 DIAGNOSIS — H5203 Hypermetropia, bilateral: Secondary | ICD-10-CM | POA: Diagnosis not present

## 2018-06-09 DIAGNOSIS — I071 Rheumatic tricuspid insufficiency: Secondary | ICD-10-CM | POA: Insufficient documentation

## 2018-06-09 DIAGNOSIS — D3132 Benign neoplasm of left choroid: Secondary | ICD-10-CM | POA: Diagnosis not present

## 2018-06-09 DIAGNOSIS — H2513 Age-related nuclear cataract, bilateral: Secondary | ICD-10-CM | POA: Diagnosis not present

## 2018-06-09 DIAGNOSIS — I481 Persistent atrial fibrillation: Secondary | ICD-10-CM | POA: Insufficient documentation

## 2018-06-09 DIAGNOSIS — I119 Hypertensive heart disease without heart failure: Secondary | ICD-10-CM | POA: Insufficient documentation

## 2018-06-09 DIAGNOSIS — I447 Left bundle-branch block, unspecified: Secondary | ICD-10-CM | POA: Insufficient documentation

## 2018-06-09 DIAGNOSIS — H524 Presbyopia: Secondary | ICD-10-CM | POA: Diagnosis not present

## 2018-06-09 DIAGNOSIS — I4819 Other persistent atrial fibrillation: Secondary | ICD-10-CM

## 2018-06-09 MED ORDER — PERFLUTREN LIPID MICROSPHERE
1.0000 mL | INTRAVENOUS | Status: AC | PRN
  Administered 2018-06-09: 2 mL via INTRAVENOUS

## 2018-06-23 ENCOUNTER — Ambulatory Visit
Admission: RE | Admit: 2018-06-23 | Discharge: 2018-06-23 | Disposition: A | Discharge status: Home / Self Care | Payer: Medicare Other | Source: Ambulatory Visit | Attending: Family Medicine | Admitting: Family Medicine

## 2018-06-23 ENCOUNTER — Telehealth: Payer: Self-pay | Admitting: Cardiology

## 2018-06-23 DIAGNOSIS — Z1231 Encounter for screening mammogram for malignant neoplasm of breast: Secondary | ICD-10-CM

## 2018-06-23 NOTE — Telephone Encounter (Signed)
Patient calling the office for samples of medication:   1.  What medication and dosage are you requesting samples for? Eliquis   2.  Are you currently out of this medication? A week worth.  Patient also would like to pick it up today she has appt in [place]. Patient lives in [place] and would like stop by. Her appt is at 12:15 pm [phone]

## 2018-06-23 NOTE — Telephone Encounter (Signed)
Returned call to patient left message on personal voice mail Eliquis 5 mg samples left at front desk of [facility] office.

## 2018-07-05 NOTE — H&P (View-Only) (Signed)
Cardiology Office Note   Date:  07/07/2018   ID:  Lisa [name], DOB 1941/05/25, MRN [id number]  PCP:  Lisa Fess, MD  Cardiologist:   Lisa Bazar [name], MD   Chief Complaint  Patient presents with  . Atrial Fibrillation      History of Present Illness: Lisa [name] is a 77 y.o. female who is seen for follow up  Afib.  She has a history of HTN, HLD, and LBBB. She was seen last November with chest pain. Troponins were negative. She was hypertensive. Started on anti reflux therapy with improvement in symptoms. Subsequent Myoview in January 2019 was normal. Prior  Echo and Adenosine Myoview in 2006 were normal.   In August 2019 she noted sudden onset of SOB. Noted some swelling. Did not really feel palpitations. She initially had bad indigestion but this resolved. She was seen by Dr. Rex [name]  and found to be in AFib. Rate 97. Started on Eliquis, metoprolol and lasix. Since then she has felt much better with resolution of dyspnea. She notes BP has been well controlled this past year. No history of CVA or TIA. No bleeding issues.   Over this past weekend she developed an URI with cough and sinus drainage. No fever or cough production. Otherwise feels well and is tolerating medication.    Past Medical History:  Diagnosis Date  . Bundle branch block, left   . Dyslipidemia   . GERD (gastroesophageal reflux disease)   . Hypercholesteremia   . Hypertension   . LBBB (left bundle branch block)   . UTI (urinary tract infection)     Past Surgical History:  Procedure Laterality Date  . bladder polyp removal    . BREAST LUMPECTOMY Left    No Visible Scar, said 30 + years ago in situ, no chemo, no radiation, or hormone replacement    . CESAREAN SECTION       Current Outpatient Medications  Medication Sig Dispense Refill  . apixaban (ELIQUIS) 5 MG TABS tablet Take 5 mg by mouth 2 (two) times daily.    . calcium carbonate (TUMS - DOSED IN MG ELEMENTAL CALCIUM) 500 MG chewable tablet  Chew 1 tablet by mouth daily as needed for indigestion or heartburn.    . Ergocalciferol (VITAMIN D2) 2000 units TABS Take 2,000 Units by mouth daily.    . furosemide (LASIX) 20 MG tablet TAKE 1 TABLET BY MOUTH EVERY DAY IN THE MORNING  0  . losartan-hydrochlorothiazide (HYZAAR) 100-12.5 MG tablet Take 1 tablet by mouth daily.    . metoprolol tartrate (LOPRESSOR) 25 MG tablet Take 25 mg by mouth 2 (two) times daily with a meal.  1  . simvastatin (ZOCOR) 40 MG tablet Take 40 mg by mouth daily.    Marland Kitchen amLODipine (NORVASC) 5 MG tablet Take 5 mg by mouth daily.    . ranitidine (ZANTAC) 150 MG tablet Take 150 mg by mouth 2 (two) times daily.     No current facility-administered medications for this visit.     Allergies:   Benicar [olmesartan]; Codeine; Fosamax [alendronate sodium]; Lisinopril; and Norvasc [amlodipine]    Social History:  The patient  reports that she quit smoking about 26 years ago. She quit after 20.00 years of use. She has never used smokeless tobacco. She reports that she drinks alcohol. She reports that she does not use drugs.   Family History:  The patient's family history includes Breast cancer in her mother; CVA in her brother and brother; Crohn's  disease in her brother; Diabetes in her brother; Hip fracture in her mother; Other in her brother; Renal cancer in her brother; Ulcers in her father.    ROS:  Please see the history of present illness.   Otherwise, review of systems are positive for none.   All other systems are reviewed and negative.    PHYSICAL EXAM: VS:  BP 138/90   Pulse 72   Ht 5\' 5"  (1.651 m)   Wt 165 lb 6 oz (75 kg)   BMI 27.52 kg/m  , BMI Body mass index is 27.52 kg/m. GENERAL:  Well appearing WF in NAD HEENT:  PERRL, EOMI, sclera are clear. Oropharynx is clear. NECK:  No jugular venous distention, carotid upstroke brisk and symmetric, no bruits, no thyromegaly or adenopathy LUNGS:  Clear to auscultation bilaterally CHEST:  Unremarkable HEART:   IRRR,  PMI not displaced or sustained,S1 and S2 within normal limits, no S3, no S4: no clicks, no rubs, no murmurs ABD:  Soft, nontender. BS +, no masses or bruits. No hepatomegaly, no splenomegaly EXT:  2 + pulses throughout, no edema, no cyanosis no clubbing SKIN:  Warm and dry.  No rashes NEURO:  Alert and oriented x 3. Cranial nerves II through XII intact. PSYCH:  Cognitively intact     EKG:  EKG is not ordered today.    Recent Labs: 08/20/2017: BUN 15; Creatinine, Ser 0.80; Hemoglobin 12.3; Platelets 266; Potassium 3.5; Sodium 136    Lipid Panel No results found for: CHOL, TRIG, HDL, CHOLHDL, VLDL, LDLCALC, LDLDIRECT    Wt Readings from Last 3 Encounters:  07/07/18 165 lb 6 oz (75 kg)  06/05/18 160 lb 6.4 oz (72.8 kg)  10/23/17 167 lb (75.8 kg)      Other studies Reviewed: Additional studies/ records that were reviewed today include:  Labs dated 05/29/17: cholesterol 163, triglycerides 74, HDL 63, LDL 85. LFTs and TSH normal Dated 05/29/18: cholesterol 129, triglycerides 82, HDL 55, LDL 58. Normal CBC, Chemistries, TSH. BNP 143.   Myoview 10/23/17: Study Highlights    Nuclear stress EF: 51%.  The left ventricular ejection fraction is mildly decreased (45-54%).  There was no ST segment deviation noted during stress.  Defect 1: There is a small defect of moderate severity present in the mid anterior location. This is due to breast attenuation artifact.  This is a low risk study.  Septal wall hypokinesis consistent with left bundle branch block.    Echo 06/09/18: Study Conclusions  - Left ventricle: The cavity size was normal. Systolic function was   mildly to moderately reduced. The estimated ejection fraction was   in the range of 40% to 45%. Diffuse hypokinesis. Regional wall   motion abnormalities cannot be excluded. The study is not   technically sufficient to allow evaluation of LV diastolic   function. - Ventricular septum: Septal motion showed  dyssynergy. These   changes are consistent with a left bundle branch block. - Mitral valve: Calcified annulus. There was trivial regurgitation. - Left atrium: The atrium was moderately dilated. - Right ventricle: Systolic function was mildly reduced. - Atrial septum: No defect or patent foramen ovale was identified. - Tricuspid valve: There was moderate regurgitation. - Pulmonic valve: There was mild regurgitation. - Pulmonary arteries: Systolic pressure was mildly increased.  Impressions:  - Technically difficult study, echo contrast used to opacify LV.     Appears to have diffuse mild-moderate hypokinesis, worst at the   apex and anterior/anteroseptal regions but seen across multiple  coronary distributions. Also has dyssynchrony due to LBBB. Given   distribution, favor LBBB-related but cannot completely excluded   CAD as cause.   ASSESSMENT AND PLAN:  1. Atrial fibrillation persistent. Rate now well Controlled on metoprolol. [place] Vasc score of 3. Now on Eliquis at appropriate dose. Symptoms improved with medical therapy. Echo showed mild LV dysfunction with dyssynergy due to LBBB. Moderate LAE.  I have recommended attempt at [place] since she does have some LV dysfunction. Will schedule for the first week in November.  2. HTN. Well controlled. 3. Hypercholesterolemia. On statin- excellent control 4. Chronic LBBB.- normal Myoview in January 2019.     Signed, Masson Nalepa [name], MD , [facility] 07/07/2018 8:28 AM    [facility] [address], [place], [place], [address] Phone [phone], Fax [phone]

## 2018-07-05 NOTE — Progress Notes (Signed)
Cardiology Office Note   Date:  07/07/2018   ID:  Lisa [name], DOB Jun 04, 1941, MRN [id number]  PCP:  Hulan Fess, MD  Cardiologist:   Yul Diana [name], MD   Chief Complaint  Patient presents with  . Atrial Fibrillation      History of Present Illness: Lisa [name] is a 77 y.o. female who is seen for follow up  Afib.  She has a history of HTN, HLD, and LBBB. She was seen last November with chest pain. Troponins were negative. She was hypertensive. Started on anti reflux therapy with improvement in symptoms. Subsequent Myoview in January 2019 was normal. Prior  Echo and Adenosine Myoview in 2006 were normal.   In August 2019 she noted sudden onset of SOB. Noted some swelling. Did not really feel palpitations. She initially had bad indigestion but this resolved. She was seen by Dr. Rex Kras  and found to be in AFib. Rate 97. Started on Eliquis, metoprolol and lasix. Since then she has felt much better with resolution of dyspnea. She notes BP has been well controlled this past year. No history of CVA or TIA. No bleeding issues.   Over this past weekend she developed an URI with cough and sinus drainage. No fever or cough production. Otherwise feels well and is tolerating medication.    Past Medical History:  Diagnosis Date  . Bundle branch block, left   . Dyslipidemia   . GERD (gastroesophageal reflux disease)   . Hypercholesteremia   . Hypertension   . LBBB (left bundle branch block)   . UTI (urinary tract infection)     Past Surgical History:  Procedure Laterality Date  . bladder polyp removal    . BREAST LUMPECTOMY Left    No Visible Scar, said 30 + years ago in situ, no chemo, no radiation, or hormone replacement    . CESAREAN SECTION       Current Outpatient Medications  Medication Sig Dispense Refill  . apixaban (ELIQUIS) 5 MG TABS tablet Take 5 mg by mouth 2 (two) times daily.    . calcium carbonate (TUMS - DOSED IN MG ELEMENTAL CALCIUM) 500 MG chewable tablet  Chew 1 tablet by mouth daily as needed for indigestion or heartburn.    . Ergocalciferol (VITAMIN D2) 2000 units TABS Take 2,000 Units by mouth daily.    . furosemide (LASIX) 20 MG tablet TAKE 1 TABLET BY MOUTH EVERY DAY IN THE MORNING  0  . losartan-hydrochlorothiazide (HYZAAR) 100-12.5 MG tablet Take 1 tablet by mouth daily.    . metoprolol tartrate (LOPRESSOR) 25 MG tablet Take 25 mg by mouth 2 (two) times daily with a meal.  1  . simvastatin (ZOCOR) 40 MG tablet Take 40 mg by mouth daily.    Marland Kitchen amLODipine (NORVASC) 5 MG tablet Take 5 mg by mouth daily.    . ranitidine (ZANTAC) 150 MG tablet Take 150 mg by mouth 2 (two) times daily.     No current facility-administered medications for this visit.     Allergies:   Benicar [olmesartan]; Codeine; Fosamax [alendronate sodium]; Lisinopril; and Norvasc [amlodipine]    Social History:  The patient  reports that she quit smoking about 26 years ago. She quit after 20.00 years of use. She has never used smokeless tobacco. She reports that she drinks alcohol. She reports that she does not use drugs.   Family History:  The patient's family history includes Breast cancer in her mother; CVA in her brother and brother; Crohn's  disease in her brother; Diabetes in her brother; Hip fracture in her mother; Other in her brother; Renal cancer in her brother; Ulcers in her father.    ROS:  Please see the history of present illness.   Otherwise, review of systems are positive for none.   All other systems are reviewed and negative.    PHYSICAL EXAM: VS:  BP 138/90   Pulse 72   Ht 5\' 5"  (1.651 m)   Wt 165 lb 6 oz (75 kg)   BMI 27.52 kg/m  , BMI Body mass index is 27.52 kg/m. GENERAL:  Well appearing WF in NAD HEENT:  PERRL, EOMI, sclera are clear. Oropharynx is clear. NECK:  No jugular venous distention, carotid upstroke brisk and symmetric, no bruits, no thyromegaly or adenopathy LUNGS:  Clear to auscultation bilaterally CHEST:  Unremarkable HEART:   IRRR,  PMI not displaced or sustained,S1 and S2 within normal limits, no S3, no S4: no clicks, no rubs, no murmurs ABD:  Soft, nontender. BS +, no masses or bruits. No hepatomegaly, no splenomegaly EXT:  2 + pulses throughout, no edema, no cyanosis no clubbing SKIN:  Warm and dry.  No rashes NEURO:  Alert and oriented x 3. Cranial nerves II through XII intact. PSYCH:  Cognitively intact     EKG:  EKG is not ordered today.    Recent Labs: 08/20/2017: BUN 15; Creatinine, Ser 0.80; Hemoglobin 12.3; Platelets 266; Potassium 3.5; Sodium 136    Lipid Panel No results found for: CHOL, TRIG, HDL, CHOLHDL, VLDL, LDLCALC, LDLDIRECT    Wt Readings from Last 3 Encounters:  07/07/18 165 lb 6 oz (75 kg)  06/05/18 160 lb 6.4 oz (72.8 kg)  10/23/17 167 lb (75.8 kg)      Other studies Reviewed: Additional studies/ records that were reviewed today include:  Labs dated 05/29/17: cholesterol 163, triglycerides 74, HDL 63, LDL 85. LFTs and TSH normal Dated 05/29/18: cholesterol 129, triglycerides 82, HDL 55, LDL 58. Normal CBC, Chemistries, TSH. BNP 143.   Myoview 10/23/17: Study Highlights    Nuclear stress EF: 51%.  The left ventricular ejection fraction is mildly decreased (45-54%).  There was no ST segment deviation noted during stress.  Defect 1: There is a small defect of moderate severity present in the mid anterior location. This is due to breast attenuation artifact.  This is a low risk study.  Septal wall hypokinesis consistent with left bundle branch block.    Echo 06/09/18: Study Conclusions  - Left ventricle: The cavity size was normal. Systolic function was   mildly to moderately reduced. The estimated ejection fraction was   in the range of 40% to 45%. Diffuse hypokinesis. Regional wall   motion abnormalities cannot be excluded. The study is not   technically sufficient to allow evaluation of LV diastolic   function. - Ventricular septum: Septal motion showed  dyssynergy. These   changes are consistent with a left bundle branch block. - Mitral valve: Calcified annulus. There was trivial regurgitation. - Left atrium: The atrium was moderately dilated. - Right ventricle: Systolic function was mildly reduced. - Atrial septum: No defect or patent foramen ovale was identified. - Tricuspid valve: There was moderate regurgitation. - Pulmonic valve: There was mild regurgitation. - Pulmonary arteries: Systolic pressure was mildly increased.  Impressions:  - Technically difficult study, echo contrast used to opacify LV.     Appears to have diffuse mild-moderate hypokinesis, worst at the   apex and anterior/anteroseptal regions but seen across multiple  coronary distributions. Also has dyssynchrony due to LBBB. Given   distribution, favor LBBB-related but cannot completely excluded   CAD as cause.   ASSESSMENT AND PLAN:  1. Atrial fibrillation persistent. Rate now well Controlled on metoprolol. [place] Vasc score of 3. Now on Eliquis at appropriate dose. Symptoms improved with medical therapy. Echo showed mild LV dysfunction with dyssynergy due to LBBB. Moderate LAE.  I have recommended attempt at [place] since she does have some LV dysfunction. Will schedule for the first week in November.  2. HTN. Well controlled. 3. Hypercholesterolemia. On statin- excellent control 4. Chronic LBBB.- normal Myoview in January 2019.     Signed, Lynne Takemoto [name], MD , [facility] 07/07/2018 8:28 AM    [facility] [address], [place], [place], [address] Phone [phone], Fax [phone]

## 2018-07-07 ENCOUNTER — Encounter: Payer: Self-pay | Admitting: Cardiology

## 2018-07-07 ENCOUNTER — Ambulatory Visit (INDEPENDENT_AMBULATORY_CARE_PROVIDER_SITE_OTHER): Payer: Medicare Other | Admitting: Cardiology

## 2018-07-07 ENCOUNTER — Other Ambulatory Visit: Payer: Self-pay | Admitting: Cardiology

## 2018-07-07 VITALS — BP 138/90 | HR 72 | Ht 65.0 in | Wt 165.4 lb

## 2018-07-07 DIAGNOSIS — I447 Left bundle-branch block, unspecified: Secondary | ICD-10-CM

## 2018-07-07 DIAGNOSIS — I4819 Other persistent atrial fibrillation: Secondary | ICD-10-CM

## 2018-07-07 DIAGNOSIS — E7800 Pure hypercholesterolemia, unspecified: Secondary | ICD-10-CM | POA: Diagnosis not present

## 2018-07-07 DIAGNOSIS — I10 Essential (primary) hypertension: Secondary | ICD-10-CM

## 2018-07-07 DIAGNOSIS — I4891 Unspecified atrial fibrillation: Secondary | ICD-10-CM

## 2018-07-07 NOTE — Patient Instructions (Signed)
  You are scheduled for a Cardioversion on Wednesday 07/30/18 with Dr.Skains.  Please arrive at the [facility] (Main Entrance A) at [facility]: [address] [place], [place] [address] at 12:00 noon. (1 hour prior to procedure unless lab work is needed; if lab work is needed arrive 1.5 hours ahead)  DIET: Nothing to eat or drink after midnight except a sip of water with medications (see medication instructions below)  Medication Instructions:   Continue your anticoagulant: Eliquis You will need to continue your anticoagulant after your procedure until you  are told by your  Provider that it is safe to stop   Labs: To be done 07/22/18  [facility] 8:00 am to 12:00 noon No appointment needed  You must have a responsible person to drive you home and stay in the waiting area during your procedure. Failure to do so could result in cancellation.  Bring your insurance cards.  *Special Note: Every effort is made to have your procedure done on time. Occasionally there are emergencies that occur at the hospital that may cause delays. Please be patient if a delay does occur.

## 2018-07-22 DIAGNOSIS — I4891 Unspecified atrial fibrillation: Secondary | ICD-10-CM | POA: Diagnosis not present

## 2018-07-22 LAB — CBC WITH DIFFERENTIAL/PLATELET
BASOS ABS: 0.1 10*3/uL (ref 0.0–0.2)
BASOS: 1 %
EOS (ABSOLUTE): 0.2 10*3/uL (ref 0.0–0.4)
Eos: 3 %
HEMOGLOBIN: 12.7 g/dL (ref 11.1–15.9)
Hematocrit: 40.5 % (ref 34.0–46.6)
IMMATURE GRANS (ABS): 0 10*3/uL (ref 0.0–0.1)
Immature Granulocytes: 0 %
LYMPHS ABS: 2.3 10*3/uL (ref 0.7–3.1)
LYMPHS: 29 %
MCH: 25.5 pg — AB (ref 26.6–33.0)
MCHC: 31.4 g/dL — AB (ref 31.5–35.7)
MCV: 81 fL (ref 79–97)
Monocytes Absolute: 0.6 10*3/uL (ref 0.1–0.9)
Monocytes: 7 %
NEUTROS ABS: 4.8 10*3/uL (ref 1.4–7.0)
Neutrophils: 60 %
PLATELETS: 291 10*3/uL (ref 150–450)
RBC: 4.98 x10E6/uL (ref 3.77–5.28)
RDW: 13.5 % (ref 12.3–15.4)
WBC: 8 10*3/uL (ref 3.4–10.8)

## 2018-07-22 LAB — BASIC METABOLIC PANEL
BUN / CREAT RATIO: 22 (ref 12–28)
BUN: 19 mg/dL (ref 8–27)
CHLORIDE: 98 mmol/L (ref 96–106)
CO2: 24 mmol/L (ref 20–29)
Calcium: 9.9 mg/dL (ref 8.7–10.3)
Creatinine, Ser: 0.86 mg/dL (ref 0.57–1.00)
GFR, EST AFRICAN AMERICAN: 76 mL/min/{1.73_m2} (ref >59)
GFR, EST NON AFRICAN AMERICAN: 66 mL/min/{1.73_m2} (ref >59)
Glucose: 99 mg/dL (ref 65–99)
POTASSIUM: 4 mmol/L (ref 3.5–5.2)
Sodium: 137 mmol/L (ref 134–144)

## 2018-07-30 ENCOUNTER — Encounter (HOSPITAL_COMMUNITY)
Admission: RE | Disposition: A | Discharge status: Home / Self Care | Payer: Self-pay | Source: Ambulatory Visit | Attending: Cardiology

## 2018-07-30 ENCOUNTER — Other Ambulatory Visit: Payer: Self-pay

## 2018-07-30 ENCOUNTER — Encounter (HOSPITAL_COMMUNITY): Payer: Self-pay | Admitting: *Deleted

## 2018-07-30 ENCOUNTER — Ambulatory Visit (HOSPITAL_COMMUNITY): Payer: Medicare Other | Admitting: Certified Registered"

## 2018-07-30 ENCOUNTER — Ambulatory Visit (HOSPITAL_COMMUNITY)
Admission: RE | Admit: 2018-07-30 | Discharge: 2018-07-30 | Disposition: A | Discharge status: Home / Self Care | Payer: Medicare Other | Source: Ambulatory Visit | Attending: Cardiology | Admitting: Cardiology

## 2018-07-30 DIAGNOSIS — Z885 Allergy status to narcotic agent status: Secondary | ICD-10-CM | POA: Insufficient documentation

## 2018-07-30 DIAGNOSIS — K219 Gastro-esophageal reflux disease without esophagitis: Secondary | ICD-10-CM | POA: Diagnosis not present

## 2018-07-30 DIAGNOSIS — Z823 Family history of stroke: Secondary | ICD-10-CM | POA: Insufficient documentation

## 2018-07-30 DIAGNOSIS — Z87440 Personal history of urinary (tract) infections: Secondary | ICD-10-CM | POA: Diagnosis not present

## 2018-07-30 DIAGNOSIS — Z888 Allergy status to other drugs, medicaments and biological substances status: Secondary | ICD-10-CM | POA: Diagnosis not present

## 2018-07-30 DIAGNOSIS — Z87891 Personal history of nicotine dependence: Secondary | ICD-10-CM | POA: Diagnosis not present

## 2018-07-30 DIAGNOSIS — I10 Essential (primary) hypertension: Secondary | ICD-10-CM | POA: Insufficient documentation

## 2018-07-30 DIAGNOSIS — Z9889 Other specified postprocedural states: Secondary | ICD-10-CM | POA: Insufficient documentation

## 2018-07-30 DIAGNOSIS — E7800 Pure hypercholesterolemia, unspecified: Secondary | ICD-10-CM | POA: Insufficient documentation

## 2018-07-30 DIAGNOSIS — Z7901 Long term (current) use of anticoagulants: Secondary | ICD-10-CM | POA: Diagnosis not present

## 2018-07-30 DIAGNOSIS — I480 Paroxysmal atrial fibrillation: Secondary | ICD-10-CM | POA: Diagnosis not present

## 2018-07-30 DIAGNOSIS — I447 Left bundle-branch block, unspecified: Secondary | ICD-10-CM | POA: Diagnosis not present

## 2018-07-30 DIAGNOSIS — Z79899 Other long term (current) drug therapy: Secondary | ICD-10-CM | POA: Diagnosis not present

## 2018-07-30 DIAGNOSIS — I4891 Unspecified atrial fibrillation: Secondary | ICD-10-CM | POA: Insufficient documentation

## 2018-07-30 HISTORY — PX: CARDIOVERSION: SHX1299

## 2018-07-30 SURGERY — CARDIOVERSION
Anesthesia: General

## 2018-07-30 MED ORDER — SODIUM CHLORIDE 0.9 % IV SOLN
INTRAVENOUS | Status: DC
  Administered 2018-07-30: 12:00:00 via INTRAVENOUS

## 2018-07-30 MED ORDER — PROPOFOL 10 MG/ML IV BOLUS
INTRAVENOUS | Status: DC | PRN
  Administered 2018-07-30: 60 mg via INTRAVENOUS

## 2018-07-30 MED ORDER — LIDOCAINE 2% (20 MG/ML) 5 ML SYRINGE
INTRAMUSCULAR | Status: DC | PRN
  Administered 2018-07-30: 60 mg via INTRAVENOUS

## 2018-07-30 NOTE — Anesthesia Procedure Notes (Signed)
Procedure Name: General with mask airway Performed by: Valda Favia, CRNA Pre-anesthesia Checklist: Patient identified, Emergency Drugs available, Suction available, Patient being monitored and Timeout performed Patient Re-evaluated:Patient Re-evaluated prior to induction Oxygen Delivery Method: Ambu bag Preoxygenation: Pre-oxygenation with 100% oxygen Induction Type: IV induction Placement Confirmation: positive ETCO2 Dental Injury: Teeth and Oropharynx as per pre-operative assessment

## 2018-07-30 NOTE — Anesthesia Preprocedure Evaluation (Addendum)
Anesthesia Evaluation  Patient identified by MRN, date of birth, ID band Patient awake    Reviewed: Allergy & Precautions, H&P , NPO status , Patient's Chart, lab work & pertinent test results, reviewed documented beta blocker date and time   Airway Mallampati: II  TM Distance: >3 FB Neck ROM: Full    Dental no notable dental hx. (+) Partial Lower, Partial Upper, Dental Advisory Given   Pulmonary neg pulmonary ROS, former smoker,    Pulmonary exam normal breath sounds clear to auscultation       Cardiovascular hypertension, Pt. on medications and Pt. on home beta blockers + dysrhythmias Atrial Fibrillation  Rhythm:Irregular Rate:Normal     Neuro/Psych negative neurological ROS  negative psych ROS   GI/Hepatic Neg liver ROS, GERD  Medicated and Controlled,  Endo/Other  negative endocrine ROS  Renal/GU negative Renal ROS  negative genitourinary   Musculoskeletal   Abdominal   Peds  Hematology negative hematology ROS (+)   Anesthesia Other Findings   Reproductive/Obstetrics negative OB ROS                            Anesthesia Physical Anesthesia Plan  ASA: III  Anesthesia Plan: General   Post-op Pain Management:    Induction: Intravenous  PONV Risk Score and Plan: 3 and Treatment may vary due to age or medical condition  Airway Management Planned: Mask  Additional Equipment:   Intra-op Plan:   Post-operative Plan:   Informed Consent: I have reviewed the patients History and Physical, chart, labs and discussed the procedure including the risks, benefits and alternatives for the proposed anesthesia with the patient or authorized representative who has indicated his/her understanding and acceptance.   Dental advisory given  Plan Discussed with: CRNA  Anesthesia Plan Comments:         Anesthesia Quick Evaluation

## 2018-07-30 NOTE — Transfer of Care (Signed)
Immediate Anesthesia Transfer of Care Note  Patient: Lisa [name]  Procedure(s) Performed: CARDIOVERSION (N/A )  Patient Location: Endoscopy Unit  Anesthesia Type:General  Level of Consciousness: awake and alert   Airway & Oxygen Therapy: Patient Spontanous Breathing  Post-op Assessment: Report given to RN and Post -op Vital signs reviewed and stable  Post vital signs: Reviewed and stable  Last Vitals:  Vitals Value Taken Time  BP    Temp    Pulse    Resp    SpO2      Last Pain:  Vitals:   07/30/18 1205  TempSrc: Oral  PainSc: 0-No pain         Complications: No apparent anesthesia complications

## 2018-07-30 NOTE — Interval H&P Note (Signed)
History and Physical Interval Note:  07/30/2018 11:37 AM  Lisa [name]  has presented today for surgery, with the diagnosis of A-FIB  The various methods of treatment have been discussed with the patient and family. After consideration of risks, benefits and other options for treatment, the patient has consented to  Procedure(s): CARDIOVERSION (N/A) as a surgical intervention .  The patient's history has been reviewed, patient examined, no change in status, stable for surgery.  I have reviewed the patient's chart and labs.  Questions were answered to the patient's satisfaction.     [employer]

## 2018-07-30 NOTE — Discharge Instructions (Signed)
Electrical Cardioversion, Care After °This sheet gives you information about how to care for yourself after your procedure. Your health care provider may also give you more specific instructions. If you have problems or questions, contact your health care provider. °What can I expect after the procedure? °After the procedure, it is common to have: °· Some redness on the skin where the shocks were given. ° °Follow these instructions at home: °· Do not drive for 24 hours if you were given a medicine to help you relax (sedative). °· Take over-the-counter and prescription medicines only as told by your health care provider. °· Ask your health care provider how to check your pulse. Check it often. °· Rest for 48 hours after the procedure or as told by your health care provider. °· Avoid or limit your caffeine use as told by your health care provider. °Contact a health care provider if: °· You feel like your heart is beating too quickly or your pulse is not regular. °· You have a serious muscle cramp that does not go away. °Get help right away if: °· You have discomfort in your chest. °· You are dizzy or you feel faint. °· You have trouble breathing or you are short of breath. °· Your speech is slurred. °· You have trouble moving an arm or leg on one side of your body. °· Your fingers or toes turn cold or blue. °This information is not intended to replace advice given to you by your health care provider. Make sure you discuss any questions you have with your health care provider. °Document Released: 07/01/2013 Document Revised: 04/13/2016 Document Reviewed: 03/16/2016 °Elsevier Interactive Patient Education © 2018 [employer] ° °

## 2018-07-30 NOTE — CV Procedure (Signed)
    Electrical Cardioversion Procedure Note Lisa [name] [id number] 01-29-1941  Procedure: Electrical Cardioversion Indications:  Atrial Fibrillation  Time Out: Verified patient identification, verified procedure,medications/allergies/relevent history reviewed, required imaging and test results available.  Performed  Procedure Details  The patient was NPO after midnight. Anesthesia was administered at the beside  by Dr.Fitzgerald with 60mg  of propofol.  Cardioversion was performed with synchronized biphasic defibrillation via AP pads with 120 joules.  1 attempt(s) were performed.  The patient converted to normal sinus rhythm. The patient tolerated the procedure well   IMPRESSION:  Successful cardioversion of atrial fibrillation    Lisa [name] 07/30/2018, 12:51 PM

## 2018-07-30 NOTE — Anesthesia Postprocedure Evaluation (Signed)
Anesthesia Post Note  Patient: Lisa [name]  Procedure(s) Performed: CARDIOVERSION (N/A )     Patient location during evaluation: Endoscopy Anesthesia Type: General Level of consciousness: awake and alert Pain management: pain level controlled Vital Signs Assessment: post-procedure vital signs reviewed and stable Respiratory status: spontaneous breathing, nonlabored ventilation and respiratory function stable Cardiovascular status: blood pressure returned to baseline and stable Postop Assessment: no apparent nausea or vomiting Anesthetic complications: no    Last Vitals:  Vitals:   07/30/18 1235 07/30/18 1240  BP: 128/64 (!) 148/93  Pulse: (!) 57 (!) 55  Resp: 14 16  Temp:    SpO2: 96% 97%    Last Pain:  Vitals:   07/30/18 1240  TempSrc:   PainSc: 0-No pain                 Quanta Robertshaw,W. EDMOND

## 2018-08-02 ENCOUNTER — Encounter (HOSPITAL_COMMUNITY): Payer: Self-pay | Admitting: Cardiology

## 2018-08-08 NOTE — Progress Notes (Signed)
Cardiology Office Note   Date:  08/11/2018   ID:  Lisa [name], DOB 1940/10/29, MRN [id number]  PCP:  Hulan Fess, MD  Cardiologist:    [name], MD   Chief Complaint  Patient presents with  . Follow-up  . Atrial Fibrillation      History of Present Illness: Lisa [name] is a 77 y.o. female who is seen for follow up  Afib.  She has a history of HTN, HLD, and LBBB. She was seen last November with chest pain. Troponins were negative. She was hypertensive. Started on anti reflux therapy with improvement in symptoms. Subsequent Myoview in January 2019 was normal. Prior  Echo and Adenosine Myoview in 2006 were normal.   In August 2019 she noted sudden onset of SOB. Noted some swelling. Did not really feel palpitations. She initially had bad indigestion but this resolved. She was seen by Dr. Rex Kras  and found to be in AFib. Rate 97. Eliquis, metoprolol and lasix. On 07/30/18 she underwent successful DCCV.   Since then she notes her HR is less. She does still note some SOB on exertion. BP at home ranges 130-140s. No edema, dizziness, chest pain.      Past Medical History:  Diagnosis Date  . Bundle branch block, left   . Dyslipidemia   . GERD (gastroesophageal reflux disease)   . Hypercholesteremia   . Hypertension   . LBBB (left bundle branch block)   . UTI (urinary tract infection)     Past Surgical History:  Procedure Laterality Date  . bladder polyp removal    . BREAST LUMPECTOMY Left    No Visible Scar, said 30 + years ago in situ, no chemo, no radiation, or hormone replacement    . CARDIOVERSION N/A 07/30/2018   Procedure: CARDIOVERSION;  Surgeon: Jerline Pain, MD;  Location: [facility] ENDOSCOPY;  Service: Cardiovascular;  Laterality: N/A;  . CESAREAN SECTION       Current Outpatient Medications  Medication Sig Dispense Refill  . apixaban (ELIQUIS) 5 MG TABS tablet Take 5 mg by mouth 2 (two) times daily.    . calcium carbonate (TUMS - DOSED IN MG ELEMENTAL  CALCIUM) 500 MG chewable tablet Chew 1 tablet by mouth daily as needed for indigestion or heartburn.    . Ergocalciferol (VITAMIN D2) 2000 units TABS Take 2,000 Units by mouth daily.    . furosemide (LASIX) 20 MG tablet Take 20 mg by mouth daily.   0  . metoprolol tartrate (LOPRESSOR) 25 MG tablet Take 25 mg by mouth 2 (two) times daily with a meal.  1  . pantoprazole (PROTONIX) 40 MG tablet Take 40 mg by mouth daily as needed for heartburn.  4  . simvastatin (ZOCOR) 40 MG tablet Take 40 mg by mouth daily.    Marland Kitchen losartan-hydrochlorothiazide (HYZAAR) 100-25 MG tablet Take 1 tablet by mouth daily. 90 tablet 3   No current facility-administered medications for this visit.     Allergies:   Benicar [olmesartan]; Codeine; Fosamax [alendronate sodium]; Lisinopril; and Norvasc [amlodipine]    Social History:  The patient  reports that she quit smoking about 26 years ago. She quit after 20.00 years of use. She has never used smokeless tobacco. She reports that she drinks alcohol. She reports that she does not use drugs.   Family History:  The patient's family history includes Breast cancer in her mother; CVA in her brother and brother; Crohn's disease in her brother; Diabetes in her brother; Hip fracture in  her mother; Other in her brother; Renal cancer in her brother; Ulcers in her father.    ROS:  Please see the history of present illness.   Otherwise, review of systems are positive for none.   All other systems are reviewed and negative.    PHYSICAL EXAM: VS:  BP (!) 158/82   Pulse (!) 54   Ht 5\' 5"  (1.651 m)   Wt 164 lb 12.8 oz (74.8 kg)   BMI 27.42 kg/m  , BMI Body mass index is 27.42 kg/m. GENERAL:  Well appearing WF in NAD HEENT:  PERRL, EOMI, sclera are clear. Oropharynx is clear. NECK:  No jugular venous distention, carotid upstroke brisk and symmetric, no bruits, no thyromegaly or adenopathy LUNGS:  Clear to auscultation bilaterally CHEST:  Unremarkable HEART:  RRR,  PMI not  displaced or sustained,S1 and S2 within normal limits, no S3, no S4: no clicks, no rubs, no murmurs ABD:  Soft, nontender. BS +, no masses or bruits. No hepatomegaly, no splenomegaly EXT:  2 + pulses throughout, no edema, no cyanosis no clubbing SKIN:  Warm and dry.  No rashes NEURO:  Alert and oriented x 3. Cranial nerves II through XII intact. PSYCH:  Cognitively intact     EKG:  EKG is  ordered today. NSR with PACs. Rate 54. LBBB. I have personally reviewed and interpreted this study.     Recent Labs: 07/22/2018: BUN 19; Creatinine, Ser 0.86; Hemoglobin 12.7; Platelets 291; Potassium 4.0; Sodium 137    Lipid Panel No results found for: CHOL, TRIG, HDL, CHOLHDL, VLDL, LDLCALC, LDLDIRECT    Wt Readings from Last 3 Encounters:  08/11/18 164 lb 12.8 oz (74.8 kg)  07/30/18 165 lb 5.5 oz (75 kg)  07/07/18 165 lb 6 oz (75 kg)      Other studies Reviewed: Additional studies/ records that were reviewed today include:  Labs dated 05/29/17: cholesterol 163, triglycerides 74, HDL 63, LDL 85. LFTs and TSH normal Dated 05/29/18: cholesterol 129, triglycerides 82, HDL 55, LDL 58. Normal CBC, Chemistries, TSH. BNP 143.   Myoview 10/23/17: Study Highlights    Nuclear stress EF: 51%.  The left ventricular ejection fraction is mildly decreased (45-54%).  There was no ST segment deviation noted during stress.  Defect 1: There is a small defect of moderate severity present in the mid anterior location. This is due to breast attenuation artifact.  This is a low risk study.  Septal wall hypokinesis consistent with left bundle branch block.    Echo 06/09/18: Study Conclusions  - Left ventricle: The cavity size was normal. Systolic function was   mildly to moderately reduced. The estimated ejection fraction was   in the range of 40% to 45%. Diffuse hypokinesis. Regional wall   motion abnormalities cannot be excluded. The study is not   technically sufficient to allow evaluation of LV  diastolic   function. - Ventricular septum: Septal motion showed dyssynergy. These   changes are consistent with a left bundle branch block. - Mitral valve: Calcified annulus. There was trivial regurgitation. - Left atrium: The atrium was moderately dilated. - Right ventricle: Systolic function was mildly reduced. - Atrial septum: No defect or patent foramen ovale was identified. - Tricuspid valve: There was moderate regurgitation. - Pulmonic valve: There was mild regurgitation. - Pulmonary arteries: Systolic pressure was mildly increased.  Impressions:  - Technically difficult study, echo contrast used to opacify LV.     Appears to have diffuse mild-moderate hypokinesis, worst at the   apex  and anterior/anteroseptal regions but seen across multiple   coronary distributions. Also has dyssynchrony due to LBBB. Given   distribution, favor LBBB-related but cannot completely excluded   CAD as cause.   ASSESSMENT AND PLAN:  1. Atrial fibrillation persistent. Rate controlled on metoprolol. [place] Vasc score of 3. On Eliquis at appropriate dose.  Echo showed mild LV dysfunction with dyssynergy due to LBBB. Moderate LAE.  S/p successful DCCV on 07/30/18. Will continue current therapy and follow up in 3 months. 2. HTN. Elevated. Was lower when she was in Afib. Will increase losartan HCT to 100/25 mg daily 3. Hypercholesterolemia. On statin- excellent control 4. Chronic LBBB.- normal Myoview in January 2019.     Signed,  [name], MD , [facility] 08/11/2018 11:54 AM    [facility] [address] [place], [place], [address] Phone [phone], Fax [phone]

## 2018-08-11 ENCOUNTER — Encounter: Payer: Self-pay | Admitting: Cardiology

## 2018-08-11 ENCOUNTER — Ambulatory Visit (INDEPENDENT_AMBULATORY_CARE_PROVIDER_SITE_OTHER): Payer: Medicare Other | Admitting: Cardiology

## 2018-08-11 VITALS — BP 158/82 | HR 54 | Ht 65.0 in | Wt 164.8 lb

## 2018-08-11 DIAGNOSIS — I4819 Other persistent atrial fibrillation: Secondary | ICD-10-CM

## 2018-08-11 DIAGNOSIS — I10 Essential (primary) hypertension: Secondary | ICD-10-CM | POA: Diagnosis not present

## 2018-08-11 DIAGNOSIS — I480 Paroxysmal atrial fibrillation: Secondary | ICD-10-CM

## 2018-08-11 DIAGNOSIS — E7800 Pure hypercholesterolemia, unspecified: Secondary | ICD-10-CM

## 2018-08-11 DIAGNOSIS — I447 Left bundle-branch block, unspecified: Secondary | ICD-10-CM | POA: Diagnosis not present

## 2018-08-11 MED ORDER — LOSARTAN POTASSIUM-HCTZ 100-25 MG PO TABS: 1.0000 | ORAL_TABLET | Freq: Every day | ORAL | 3 refills | Status: DC

## 2018-08-11 NOTE — Patient Instructions (Signed)
We will switch losartan HCT to 100/25 mg daily  Continue  Your other therapy  Follow up in 3 months

## 2018-10-01 ENCOUNTER — Telehealth: Payer: Self-pay | Admitting: Cardiology

## 2018-10-01 MED ORDER — APIXABAN 5 MG PO TABS: 5.0000 mg | ORAL_TABLET | Freq: Two times a day (BID) | ORAL | 3 refills | Status: DC

## 2018-10-01 NOTE — Telephone Encounter (Signed)
Spoke with pt, refill sent electronically to patient preferred pharmacy and samples placed at the front desk for patient pick up.

## 2018-10-01 NOTE — Telephone Encounter (Signed)
New Message    Pt c/o medication issue:  1. Name of Medication: apixaban (ELIQUIS) 5 MG TABS tablet    2. How are you currently taking this medication (dosage and times per day)?   3. Are you having a reaction (difficulty breathing--STAT)?   4. What is your medication issue? Patient is calling because she thought Dr. [name] was going to send an Rx for Eliquis. Please call. Marland Kitchen

## 2018-11-04 NOTE — Progress Notes (Signed)
Cardiology Office Note   Date:  11/10/2018   ID:  Lisa [name], DOB 27-Aug-1941, MRN [id number]  PCP:  Hulan Fess, MD  Cardiologist:   Alexzia Kasler [name], MD   Chief Complaint  Patient presents with  . Atrial Fibrillation      History of Present Illness: Lisa [name] is a 78 y.o. female who is seen for follow up  Afib.  She has a history of HTN, HLD, and LBBB. She was seen November 2018 with chest pain. Troponins were negative. She was hypertensive. Started on anti reflux therapy with improvement in symptoms. Subsequent Myoview in January 2019 was normal. Prior  Echo and Adenosine Myoview in 2006 were normal.   In August 2019 she noted sudden onset of SOB. Noted some swelling. Did not really feel palpitations. She initially had bad indigestion but this resolved. She was seen by Dr. Rex Kras  and found to be in AFib. Rate 97. Treated with  Eliquis, metoprolol and lasix. On 07/30/18 she underwent successful DCCV.   On follow up today she is doing very well. Thinks she may have been out of rhythm once lasting less than 30 minutes. Her breathing is better. Stays active. Goes to the beach once a month and enjoys walking the beach.      Past Medical History:  Diagnosis Date  . Bundle branch block, left   . Dyslipidemia   . GERD (gastroesophageal reflux disease)   . Hypercholesteremia   . Hypertension   . LBBB (left bundle branch block)   . UTI (urinary tract infection)     Past Surgical History:  Procedure Laterality Date  . bladder polyp removal    . BREAST LUMPECTOMY Left    No Visible Scar, said 30 + years ago in situ, no chemo, no radiation, or hormone replacement    . CARDIOVERSION N/A 07/30/2018   Procedure: CARDIOVERSION;  Surgeon: Jerline Pain, MD;  Location[facility] ENDOSCOPY;  Service: Cardiovascular;  Laterality: N/A;  . CESAREAN SECTION       Current Outpatient Medications  Medication Sig Dispense Refill  . apixaban (ELIQUIS) 5 MG TABS tablet Take 1 tablet (5 mg  total) by mouth 2 (two) times daily. 60 tablet 3  . Ergocalciferol (VITAMIN D2) 2000 units TABS Take 2,000 Units by mouth daily.    . furosemide (LASIX) 20 MG tablet Take 20 mg by mouth daily.   0  . hydrochlorothiazide (HYDRODIURIL) 25 MG tablet     . losartan (COZAAR) 100 MG tablet     . metoprolol tartrate (LOPRESSOR) 25 MG tablet Take 25 mg by mouth 2 (two) times daily with a meal.  1  . simvastatin (ZOCOR) 40 MG tablet Take 40 mg by mouth daily.    . calcium carbonate (TUMS - DOSED IN MG ELEMENTAL CALCIUM) 500 MG chewable tablet Chew 1 tablet by mouth daily as needed for indigestion or heartburn.    . pantoprazole (PROTONIX) 40 MG tablet Take 40 mg by mouth daily as needed for heartburn.  4   No current facility-administered medications for this visit.     Allergies:   Benicar [olmesartan]; Codeine; Fosamax [alendronate sodium]; Lisinopril; and Norvasc [amlodipine]    Social History:  The patient  reports that she quit smoking about 27 years ago. She quit after 20.00 years of use. She has never used smokeless tobacco. She reports current alcohol use. She reports that she does not use drugs.   Family History:  The patient's family history includes Breast  cancer in her mother; CVA in her brother and brother; Crohn's disease in her brother; Diabetes in her brother; Hip fracture in her mother; Other in her brother; Renal cancer in her brother; Ulcers in her father.    ROS:  Please see the history of present illness.   Otherwise, review of systems are positive for none.   All other systems are reviewed and negative.    PHYSICAL EXAM: VS:  BP 130/60   Pulse (!) 58   Ht 5\' 5"  (1.651 m)   Wt 170 lb (77.1 kg)   SpO2 95%   BMI 28.29 kg/m  , BMI Body mass index is 28.29 kg/m. GENERAL:  Well appearing WF in NAD HEENT:  PERRL, EOMI, sclera are clear. Oropharynx is clear. NECK:  No jugular venous distention, carotid upstroke brisk and symmetric, no bruits, no thyromegaly or  adenopathy LUNGS:  Clear to auscultation bilaterally CHEST:  Unremarkable HEART:  RRR,  PMI not displaced or sustained,S1 and S2 within normal limits, no S3, no S4: no clicks, no rubs, no murmurs ABD:  Soft, nontender. BS +, no masses or bruits. No hepatomegaly, no splenomegaly EXT:  2 + pulses throughout, no edema, no cyanosis no clubbing SKIN:  Warm and dry.  No rashes NEURO:  Alert and oriented x 3. Cranial nerves II through XII intact. PSYCH:  Cognitively intact   Recent Labs: 07/22/2018: BUN 19; Creatinine, Ser 0.86; Hemoglobin 12.7; Platelets 291; Potassium 4.0; Sodium 137    Lipid Panel No results found for: CHOL, TRIG, HDL, CHOLHDL, VLDL, LDLCALC, LDLDIRECT    Wt Readings from Last 3 Encounters:  11/10/18 170 lb (77.1 kg)  08/11/18 164 lb 12.8 oz (74.8 kg)  07/30/18 165 lb 5.5 oz (75 kg)      Other studies Reviewed: Additional studies/ records that were reviewed today include:  Labs dated 05/29/17: cholesterol 163, triglycerides 74, HDL 63, LDL 85. LFTs and TSH normal Dated 05/29/18: cholesterol 129, triglycerides 82, HDL 55, LDL 58. Normal CBC, Chemistries, TSH. BNP 143.  Dated 07/22/18: Normal CBC and BMET  Myoview 10/23/17: Study Highlights    Nuclear stress EF: 51%.  The left ventricular ejection fraction is mildly decreased (45-54%).  There was no ST segment deviation noted during stress.  Defect 1: There is a small defect of moderate severity present in the mid anterior location. This is due to breast attenuation artifact.  This is a low risk study.  Septal wall hypokinesis consistent with left bundle branch block.    Echo 06/09/18: Study Conclusions  - Left ventricle: The cavity size was normal. Systolic function was   mildly to moderately reduced. The estimated ejection fraction was   in the range of 40% to 45%. Diffuse hypokinesis. Regional wall   motion abnormalities cannot be excluded. The study is not   technically sufficient to allow evaluation of  LV diastolic   function. - Ventricular septum: Septal motion showed dyssynergy. These   changes are consistent with a left bundle branch block. - Mitral valve: Calcified annulus. There was trivial regurgitation. - Left atrium: The atrium was moderately dilated. - Right ventricle: Systolic function was mildly reduced. - Atrial septum: No defect or patent foramen ovale was identified. - Tricuspid valve: There was moderate regurgitation. - Pulmonic valve: There was mild regurgitation. - Pulmonary arteries: Systolic pressure was mildly increased.  Impressions:  - Technically difficult study, echo contrast used to opacify LV.     Appears to have diffuse mild-moderate hypokinesis, worst at the   apex  and anterior/anteroseptal regions but seen across multiple   coronary distributions. Also has dyssynchrony due to LBBB. Given   distribution, favor LBBB-related but cannot completely excluded   CAD as cause.   ASSESSMENT AND PLAN:  1. Atrial fibrillation s/p DCCV on July 30, 2018. On metoprolol. [place] Vasc score of 3. On Eliquis at appropriate dose.  Echo showed mild LV dysfunction with dyssynergy due to LBBB. Moderate LAE.  Maintaining NSR.   2. HTN. Now well controlled.  3. Hypercholesterolemia. On statin- excellent control  4. Chronic LBBB.- normal Myoview in January 2019.     Signed, Izek Corvino [name], MD , [facility] 11/10/2018 10:42 AM    [facility] [address], [place], [place], [address] Phone [phone], Fax [phone]

## 2018-11-10 ENCOUNTER — Encounter: Payer: Self-pay | Admitting: Cardiology

## 2018-11-10 ENCOUNTER — Ambulatory Visit (INDEPENDENT_AMBULATORY_CARE_PROVIDER_SITE_OTHER): Payer: Medicare Other | Admitting: Cardiology

## 2018-11-10 VITALS — BP 130/60 | HR 58 | Ht 65.0 in | Wt 170.0 lb

## 2018-11-10 DIAGNOSIS — I10 Essential (primary) hypertension: Secondary | ICD-10-CM

## 2018-11-10 DIAGNOSIS — E7800 Pure hypercholesterolemia, unspecified: Secondary | ICD-10-CM

## 2018-11-10 DIAGNOSIS — I447 Left bundle-branch block, unspecified: Secondary | ICD-10-CM | POA: Diagnosis not present

## 2018-11-10 DIAGNOSIS — I480 Paroxysmal atrial fibrillation: Secondary | ICD-10-CM

## 2019-01-12 ENCOUNTER — Telehealth: Payer: Self-pay | Admitting: Cardiology

## 2019-01-12 ENCOUNTER — Telehealth: Payer: Self-pay | Admitting: Plastic Surgery

## 2019-01-12 ENCOUNTER — Encounter: Payer: Self-pay | Admitting: Plastic Surgery

## 2019-01-12 ENCOUNTER — Ambulatory Visit (INDEPENDENT_AMBULATORY_CARE_PROVIDER_SITE_OTHER): Payer: Medicare Other | Admitting: Plastic Surgery

## 2019-01-12 ENCOUNTER — Other Ambulatory Visit: Payer: Self-pay

## 2019-01-12 DIAGNOSIS — L989 Disorder of the skin and subcutaneous tissue, unspecified: Secondary | ICD-10-CM | POA: Insufficient documentation

## 2019-01-12 NOTE — Telephone Encounter (Signed)
  Patient will need to have a skin biopsy done and she would like to know if she can stop her Eliquis for one day prior to the procedure?

## 2019-01-12 NOTE — Progress Notes (Signed)
Patient ID: Lisa [name], female    DOB: May 19, 1941, 78 y.o.   MRN: [id number]   Chief Complaint  Patient presents with  . Skin Problem    The patient is a 78 year old female here with her husband for evaluation of a changing skin lesion on her right cheek.  I have seen her several years ago.  She is overall doing well.  She has noticed a red scaly lesion on the right cheek.  It is 2 cm in size and abuts another similar lesion that is 1 cm in size.  She has multiple hyperpigmented lesions on her face that appear to be seborrheic keratosis.  This does appear different.  It has a dry scaly appearance but is red and irritated.  This is concerning for a skin cancer.  She had atrial fibrillation recently and was cardioverted.  She is now on Eliquis and rate controlled.  Nothing makes the lesion better.  She is concerned because it is getting much larger and darker with time.   Review of Systems  Constitutional: Negative for activity change and appetite change.  HENT: Negative.   Eyes: Negative.   Respiratory: Negative.  Negative for chest tightness and shortness of breath.   Gastrointestinal: Negative.  Negative for abdominal pain.  Genitourinary: Negative.   Musculoskeletal: Negative.  Negative for back pain.  Skin: Positive for color change. Negative for wound.  Hematological: Negative.   Psychiatric/Behavioral: Negative.     Past Medical History:  Diagnosis Date  . Bundle branch block, left   . Dyslipidemia   . GERD (gastroesophageal reflux disease)   . Hypercholesteremia   . Hypertension   . LBBB (left bundle branch block)   . UTI (urinary tract infection)     Past Surgical History:  Procedure Laterality Date  . bladder polyp removal    . BREAST LUMPECTOMY Left    No Visible Scar, said 30 + years ago in situ, no chemo, no radiation, or hormone replacement    . CARDIOVERSION N/A 07/30/2018   Procedure: CARDIOVERSION;  Surgeon: Jerline Pain, MD;  Location: [facility] ENDOSCOPY;   Service: Cardiovascular;  Laterality: N/A;  . CESAREAN SECTION        Current Outpatient Medications:  .  apixaban (ELIQUIS) 5 MG TABS tablet, Take 1 tablet (5 mg total) by mouth 2 (two) times daily., Disp: 60 tablet, Rfl: 3 .  calcium carbonate (TUMS - DOSED IN MG ELEMENTAL CALCIUM) 500 MG chewable tablet, Chew 1 tablet by mouth daily as needed for indigestion or heartburn., Disp: , Rfl:  .  Ergocalciferol (VITAMIN D2) 2000 units TABS, Take 2,000 Units by mouth daily., Disp: , Rfl:  .  furosemide (LASIX) 20 MG tablet, Take 20 mg by mouth daily. , Disp: , Rfl: 0 .  hydrochlorothiazide (HYDRODIURIL) 25 MG tablet, , Disp: , Rfl:  .  losartan (COZAAR) 100 MG tablet, , Disp: , Rfl:  .  metoprolol tartrate (LOPRESSOR) 25 MG tablet, Take 25 mg by mouth 2 (two) times daily with a meal., Disp: , Rfl: 1 .  pantoprazole (PROTONIX) 40 MG tablet, Take 40 mg by mouth daily as needed for heartburn., Disp: , Rfl: 4 .  simvastatin (ZOCOR) 40 MG tablet, Take 40 mg by mouth daily., Disp: , Rfl:    Objective:   Vitals:   01/12/19 1410  BP: (!) 161/78  Pulse: 60  Temp: 98.1 F (36.7 C)  SpO2: 98%    Physical Exam Vitals signs reviewed.  Constitutional:  Appearance: Normal appearance.  HENT:     Head: Normocephalic and atraumatic.      Mouth/Throat:     Mouth: Mucous membranes are moist.  Eyes:     Extraocular Movements: Extraocular movements intact.  Cardiovascular:     Rate and Rhythm: Normal rate.  Pulmonary:     Effort: Pulmonary effort is normal. No respiratory distress.  Abdominal:     General: Abdomen is flat. There is no distension.  Skin:    General: Skin is warm.  Neurological:     Mental Status: She is alert and oriented to person, place, and time.  Psychiatric:        Mood and Affect: Mood normal.        Behavior: Behavior normal.        Thought Content: Thought content normal.     Assessment & Plan:  Changing skin lesion Recommend biopsy of right cheek changing  skin lesion.  Once we know what it is then possible further excision if needed.  The patient agrees with this plan.  Pictures were taken and placed in the patient chart with her permission.  [place], DO

## 2019-01-12 NOTE — Telephone Encounter (Signed)
Called patient to confirm appointment scheduled for today. Patient answered the following questions: 1.Has the patient traveled outside of the state of [place] at all within the past 6 weeks? No 2.Does the patient have a fever or cough at all? No 3.Has the patient been tested for COVID? Had a positive COVID test? No 4. Has the patient been in contact with anyone who has tested positive? No

## 2019-01-12 NOTE — Telephone Encounter (Signed)
Attempted to contact patient regarding the type of procedure., LVM Left call back number.

## 2019-01-13 ENCOUNTER — Telehealth: Payer: Self-pay | Admitting: Plastic Surgery

## 2019-01-13 ENCOUNTER — Telehealth: Payer: Self-pay | Admitting: Cardiology

## 2019-01-13 NOTE — Telephone Encounter (Signed)
Lisa [name] is calling as the patient told her to give you a call. Patient told her you had questions about he procedure she was having done.

## 2019-01-13 NOTE — Telephone Encounter (Signed)
Spoke with pt, aware will need to have surgical clearance sent to the office.

## 2019-01-13 NOTE — Telephone Encounter (Signed)
Spoke with pt, aware of dr [name] recommendations.

## 2019-01-13 NOTE — Telephone Encounter (Signed)
Call from pt re: Dr. Doug Sou - ? D/C Eliquis prior to procedure in our office Per pt- Hilda Blades, RN at Dr Doug Sou office- is requesting call from me regarding the procedure  I called Debra- & left message for call back from her  [place]

## 2019-01-13 NOTE — Telephone Encounter (Signed)
Received call from patient today. She is requesting call back regarding discontinuing Eliquis prior to skin biopsy procedure.

## 2019-01-13 NOTE — Telephone Encounter (Signed)
Spoke with pt, her medication is in the mail but she is concerned about running out before it gets to her. Aware no samples available but she can get a partial script at the local pharmacy if needed. She is also concerned because she is due to see Dr [name] in August and his schedule is full and she is scheduled in September. She wants to make sure waiting that long after DCCV is alright. She feels she is in rhythm and feels fine. Will forward to Dr [name] to review and advise.

## 2019-01-13 NOTE — Telephone Encounter (Signed)
She can wait unless symptoms progress.   Fran Neiswonger [name] MD, [facility]

## 2019-01-13 NOTE — Telephone Encounter (Signed)
Patient calling the office for samples of medication:   1.  What medication and dosage are you requesting samples for? Eliquis  2.  Are you currently out of this medication? yes    

## 2019-01-13 NOTE — Telephone Encounter (Signed)
Called Lisa [name] back, advised we needed surgical clearance- she is faxing it now.  Will check, and place surgical clearance into computer.

## 2019-01-14 ENCOUNTER — Telehealth: Payer: Self-pay | Admitting: *Deleted

## 2019-01-14 NOTE — Telephone Encounter (Signed)
   [facility] Pre-operative Risk Assessment    Request for surgical clearance:  1. What type of surgery is being performed? Biopsy of right cheek skin lesion   2. When is this surgery scheduled? TBD   3. What type of clearance is required (medical clearance vs. Pharmacy clearance to hold med vs. Both)? both  4. Are there any medications that need to be held prior to surgery and how long?eliquis- 1 day prior   5. Practice name and name of physician performing surgery? Dr Marla Roe   6. What is your office phone number [phone]    7.   What is your office fax number [phone]  8.   Anesthesia type (None, local, MAC, general) ? local   Fredia Beets 01/14/2019, 5:11 PM  _________________________________________________________________   (provider comments below)

## 2019-01-15 ENCOUNTER — Telehealth: Payer: Self-pay

## 2019-01-15 NOTE — Telephone Encounter (Signed)
   Primary Cardiologist: Peter [name], MD  Chart reviewed as part of pre-operative protocol coverage. Patient was contacted 01/15/2019 in reference to pre-operative risk assessment for pending surgery as outlined below.  Lisa [name] was last seen on 11/10/18 by Dr. [name].  Since that day, Lisa [name] has done well. She denies new cardiac symptoms, her mobility is not limited.  Per our pharmacy staff: Procedure: Biopsy of right cheek skin lesion Date of procedure: TBD  CHADS2-VASc score of  4 (CHF, HTN, AGE, DM2, stroke/tia x 2, CAD, AGE, female)  Crcl 59ml/min  Per office protocol, patient can hold Eliquis for 1 day prior to procedure.    Therefore, based on ACC/AHA guidelines, the patient would be at acceptable risk for the planned procedure without further cardiovascular testing.   This clearance and recommendation is good for 2 months from today's date and based on her current clinical picture.  I will route this recommendation to the requesting party via Epic fax function and remove from pre-op pool.  Please call with questions.  Tami Lin [name], PA 01/15/2019, 11:00 AM

## 2019-01-15 NOTE — Telephone Encounter (Signed)
Please comment on Eliquis 

## 2019-01-15 NOTE — Telephone Encounter (Signed)
Cardiac/medical clearance received from Dr. [name] for pt to d/c Eliquis for one day prior to surgery  [facility]

## 2019-01-15 NOTE — Telephone Encounter (Signed)
Patient with diagnosis of afib on Eliquis for anticoagulation.    Procedure: Biopsy of right cheek skin lesion  Date of procedure: TBD  CHADS2-VASc score of  4 (CHF, HTN, AGE, DM2, stroke/tia x 2, CAD, AGE, female)  Crcl 20ml/min  Per office protocol, patient can hold Eliquis for 1 day prior to procedure.

## 2019-02-10 ENCOUNTER — Ambulatory Visit: Payer: Medicare Other | Admitting: Plastic Surgery

## 2019-02-13 ENCOUNTER — Telehealth: Payer: Self-pay | Admitting: Plastic Surgery

## 2019-02-13 NOTE — Telephone Encounter (Signed)
Called patient to confirm appointment scheduled for Tuesday. Patient answered the following questions: 1. To the best of your knowledge, have you been in close contact with any one with a confirmed diagnosis of COVID-19? No 2. Have you had any one or more of the following; fever, chills, cough, shortness of breath, or any flu-like symptoms? No 3. Have you been diagnosed with or have a previous diagnosis of COVID 19? No 4. I am going to go over a few other symptoms with you. Please let me know if you are experiencing any of the following: None of the below a. Ear, nose, or throat discomfort b. A sore throat c. Headache d. Muscle pain e. Diarrhea f. Loss of taste or smell

## 2019-02-17 ENCOUNTER — Other Ambulatory Visit: Payer: Self-pay

## 2019-02-17 ENCOUNTER — Encounter: Payer: Self-pay | Admitting: Plastic Surgery

## 2019-02-17 ENCOUNTER — Other Ambulatory Visit (HOSPITAL_COMMUNITY)
Admission: RE | Admit: 2019-02-17 | Discharge: 2019-02-17 | Disposition: A | Discharge status: Home / Self Care | Payer: Medicare Other | Source: Ambulatory Visit | Attending: Plastic Surgery | Admitting: Plastic Surgery

## 2019-02-17 ENCOUNTER — Ambulatory Visit (INDEPENDENT_AMBULATORY_CARE_PROVIDER_SITE_OTHER): Payer: Medicare Other | Admitting: Plastic Surgery

## 2019-02-17 VITALS — BP 147/63 | HR 60 | Temp 98.5°F

## 2019-02-17 DIAGNOSIS — L570 Actinic keratosis: Secondary | ICD-10-CM | POA: Insufficient documentation

## 2019-02-17 DIAGNOSIS — L989 Disorder of the skin and subcutaneous tissue, unspecified: Secondary | ICD-10-CM

## 2019-02-17 NOTE — Progress Notes (Signed)
Preoperative Dx: changing skin lesion face  Postoperative Dx: Same  Procedure:  Skin biopsy of changing skin lesion left cheek 3 mm Skin biopsy of changing skin lesion right cheek 4 mm  Surgeon: Dr. Lyndee Leo Dillingham  Anesthesia: Lidocaine 1% with 1:100,000 epinepherine  Indication for Procedure: changing skin lesion  Description of Procedure: Risks and complications were explained to the patient.  Consent was confirmed.  Time out was called and all information was confirmed to be correct.  The area was prepped with betadine and drapped.  Lidocaine 1% with epinepherine was injected in the subcutaneous area.    Left cheek:  After waiting several minutes for the lidocaine to take affect a #15 blade was used to excise the 3 mm area in an eliptical pattern.  A 6-0 Monocryl was used to close the skin edges.  Steri strips were applied.    Right cheek:   After waiting several minutes for the lidocaine to take affect a #15 blade was used to excise the 4 mm area in an eliptical pattern.  A 6-0 Monocryl was used to close the skin edges.  Steri strips were applied.  The patient is to follow up in one week.  She tolerated the procedure well and there were no complications. The specimen were sent to pathology.

## 2019-02-24 ENCOUNTER — Other Ambulatory Visit: Payer: Self-pay

## 2019-02-24 ENCOUNTER — Encounter: Payer: Medicare Other | Admitting: Plastic Surgery

## 2019-02-24 ENCOUNTER — Ambulatory Visit (INDEPENDENT_AMBULATORY_CARE_PROVIDER_SITE_OTHER): Payer: Medicare Other | Admitting: Plastic Surgery

## 2019-02-24 ENCOUNTER — Encounter: Payer: Self-pay | Admitting: Plastic Surgery

## 2019-02-24 DIAGNOSIS — L570 Actinic keratosis: Secondary | ICD-10-CM

## 2019-02-24 MED ORDER — FLUOROURACIL 1 % EX CREA: TOPICAL_CREAM | Freq: Every day | CUTANEOUS | 0 refills | Status: DC

## 2019-02-24 NOTE — Progress Notes (Signed)
The patient is a 78 year old female here for follow-up on excision of biopsy of 2 changing skin lesions.  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  The patient said this is what she expected.  I removed the sutures today and the areas that are healing very nicely.  I would like her to give 2 weeks for healing.  Then she should start the 5-FU.  I have sent it to pharmacy.  I would like to see her back in 3 months.  We may be able to do some laser for some of the dark spots once she finishes with the cream.

## 2019-02-26 MED ORDER — FLUOROURACIL 5 % EX CREA: TOPICAL_CREAM | Freq: Every day | CUTANEOUS | 0 refills | Status: DC

## 2019-02-26 NOTE — Addendum Note (Signed)
Addended by: Wallace Going on: 02/26/2019 08:46 PM   Modules accepted: Orders

## 2019-04-23 ENCOUNTER — Telehealth (INDEPENDENT_AMBULATORY_CARE_PROVIDER_SITE_OTHER): Payer: Medicare Other | Admitting: Plastic Surgery

## 2019-04-23 ENCOUNTER — Other Ambulatory Visit: Payer: Self-pay

## 2019-04-23 ENCOUNTER — Telehealth: Payer: Self-pay

## 2019-04-23 DIAGNOSIS — L570 Actinic keratosis: Secondary | ICD-10-CM

## 2019-04-23 MED ORDER — AMOXICILLIN-POT CLAVULANATE 500-125 MG PO TABS: 1.0000 | ORAL_TABLET | Freq: Three times a day (TID) | ORAL | 0 refills | Status: DC

## 2019-04-23 NOTE — Telephone Encounter (Signed)
Received another call from patient. Patient states face is burning more than this morning when she called initially. Spoke with Dr. Marla Roe and was able to set patient up on virtual visit for 11 am this morning. Patient checked in over the phone.

## 2019-04-23 NOTE — Telephone Encounter (Signed)
Received call from patient who was seen by Dr. Marla Roe in May/June. Patient states place on her face is swollen and red. Patient is worried that it could possibly be infected. Patient requesting call back and possible antibiotic.

## 2019-04-23 NOTE — Progress Notes (Signed)
The patient called with concerns of redness and tenderness on her face.  She had some actinic keratoses that we were treating with 5-FU.  It appears that the treatment was actually working well.  She may have a slight superinfection.  I was able to see the lesion on her right cheek via the face time.  I will go ahead and call in some antibiotics for her.  She seemed to be relieved with this plan and will let us know how she is doing. The patient gave consent to have this visit done by telemedicine / virtual visit.  This is also consent for access the chart and treat the patient via this visit. The patient is located at home.  I, the provider, am at the office.  We spent 5 minutes together for the visit.

## 2019-04-28 ENCOUNTER — Telehealth: Payer: Self-pay

## 2019-04-28 NOTE — Telephone Encounter (Signed)
Received call from patient in regards to televisit from last week. Patient received 5 days of antibiotics, and is requesting more at this time. Patient states that the place on her face is better but it is still red. Patient requesting call back from clinical staff at earliest convenience.

## 2019-04-28 NOTE — Telephone Encounter (Signed)
Patient called back requesting update on antibiotic prescription. I informed her that Dr. Marla Roe is in clinic today and that we would call her with an update as soon as possible.

## 2019-04-29 MED ORDER — AMOXICILLIN-POT CLAVULANATE 500-125 MG PO TABS: 1.0000 | ORAL_TABLET | Freq: Three times a day (TID) | ORAL | 0 refills | Status: AC

## 2019-04-29 NOTE — Telephone Encounter (Signed)
Called and spoke with the patient on (04/28/19) regarding the message below.  Informed the patient that I spoke with Dr. Marla Roe regarding the message and she stated that  the redness on her face will be there for a couple of weeks.  But if she feels she needs more of the antibiotics then she can refill it.  Patient verbalized understanding and stated that the swelling has gone down, but she still has a little of the swelling left.  She feels taking more of the antibiotics will make sure it's completely cleared up.  Informed Dr. Marla Roe that the patient would like more of the antibiotics.  She stated that 5 more days can be called in.  Informed the patient that Dr. Marla Roe will send in 5 more days of the antibiotics.  Patient verbalized understanding and agreed.  Called in 5 more days of the Augmentin 500-125mg  to the patient's pharmacy.//AB/CMA

## 2019-05-13 ENCOUNTER — Other Ambulatory Visit: Payer: Self-pay | Admitting: Family Medicine

## 2019-05-13 DIAGNOSIS — Z1231 Encounter for screening mammogram for malignant neoplasm of breast: Secondary | ICD-10-CM

## 2019-05-28 ENCOUNTER — Telehealth: Payer: Self-pay

## 2019-05-28 NOTE — Progress Notes (Signed)
Cardiology Office Note   Date:  06/04/2019   ID:  [name], DOB [birth year], MRN [id number]  PCP:  Lisa Fess, MD  Cardiologist:   Marieann Zipp [name], MD   Chief Complaint  Patient presents with  . Atrial Fibrillation      History of Present Illness: Lisa [name] is a 78 y.o. female who is seen for follow up  Afib.  She has a history of HTN, HLD, and LBBB. She was seen November 2018 with chest pain. Troponins were negative. She was hypertensive. Started on anti reflux therapy with improvement in symptoms. Subsequent Myoview in January 2019 was normal. Prior  Echo and Adenosine Myoview in 2006 were normal.   In August 2019 she noted sudden onset of SOB. Noted some swelling. Did not really feel palpitations. She initially had bad indigestion but this resolved. She was seen by Dr. Rex [name]  and found to be in AFib. Rate 97. Treated with  Eliquis, metoprolol and lasix. On 07/30/18 she underwent successful DCCV.   On follow up today she is doing very well. She denies any palpitations.  Her breathing is good. Stays active. She does note some SOB going up stairs but not with pushing a lawn mower. Pulse at home typically mid 50s.      Past Medical History:  Diagnosis Date  . Bundle branch block, left   . Dyslipidemia   . GERD (gastroesophageal reflux disease)   . Hypercholesteremia   . Hypertension   . LBBB (left bundle branch block)   . UTI (urinary tract infection)     Past Surgical History:  Procedure Laterality Date  . bladder polyp removal    . BREAST LUMPECTOMY Left    No Visible Scar, said 30 + years ago in situ, no chemo, no radiation, or hormone replacement    . CARDIOVERSION N/A 07/30/2018   Procedure: CARDIOVERSION;  Surgeon: Jerline Pain, MD;  Location: [facility] ENDOSCOPY;  Service: Cardiovascular;  Laterality: N/A;  . CESAREAN SECTION       Current Outpatient Medications  Medication Sig Dispense Refill  . apixaban (ELIQUIS) 5 MG TABS tablet Take 1 tablet (5 mg  total) by mouth 2 (two) times daily. 60 tablet 3  . calcium carbonate (TUMS - DOSED IN MG ELEMENTAL CALCIUM) 500 MG chewable tablet Chew 1 tablet by mouth daily as needed for indigestion or heartburn.    . Ergocalciferol (VITAMIN D2) 2000 units TABS Take 2,000 Units by mouth daily.    . furosemide (LASIX) 20 MG tablet Take 20 mg by mouth daily.   0  . hydrochlorothiazide (HYDRODIURIL) 25 MG tablet     . losartan (COZAAR) 100 MG tablet     . metoprolol tartrate (LOPRESSOR) 25 MG tablet Take 12.5 mg by mouth 2 (two) times daily with a meal.  1  . pantoprazole (PROTONIX) 40 MG tablet Take 40 mg by mouth daily as needed for heartburn.  4  . simvastatin (ZOCOR) 40 MG tablet Take 40 mg by mouth daily.    . fluorouracil (EFUDEX) 5 % cream Apply topically daily. (Patient not taking: Reported on 06/04/2019) 40 g 0   No current facility-administered medications for this visit.     Allergies:   Benicar [olmesartan], Codeine, Fosamax [alendronate sodium], Lisinopril, and Norvasc [amlodipine]    Social History:  The patient  reports that she quit smoking about 27 years ago. She quit after 20.00 years of use. She has never used smokeless tobacco. She reports current alcohol use.  She reports that she does not use drugs.   Family History:  The patient's family history includes Breast cancer in her mother; CVA in her brother and brother; Crohn's disease in her brother; Diabetes in her brother; Hip fracture in her mother; Other in her brother; Renal cancer in her brother; Ulcers in her father.    ROS:  Please see the history of present illness.   Otherwise, review of systems are positive for none.   All other systems are reviewed and negative.    PHYSICAL EXAM: VS:  BP (!) 142/72   Pulse (!) 46   Ht 5\' 5"  (1.651 m)   Wt 173 lb 2 oz (78.5 kg)   BMI 28.81 kg/m  , BMI Body mass index is 28.81 kg/m. GENERAL:  Well appearing WF in NAD HEENT:  PERRL, EOMI, sclera are clear. Oropharynx is clear. NECK:  No  jugular venous distention, carotid upstroke brisk and symmetric, no bruits, no thyromegaly or adenopathy LUNGS:  Clear to auscultation bilaterally CHEST:  Unremarkable HEART:  RRR,  PMI not displaced or sustained,S1 and S2 within normal limits, no S3, no S4: no clicks, no rubs, no murmurs ABD:  Soft, nontender. BS +, no masses or bruits. No hepatomegaly, no splenomegaly EXT:  2 + pulses throughout, no edema, no cyanosis no clubbing SKIN:  Warm and dry.  No rashes NEURO:  Alert and oriented x 3. Cranial nerves II through XII intact. PSYCH:  Cognitively intact   Recent Labs: 07/22/2018: BUN 19; Creatinine, Ser 0.86; Hemoglobin 12.7; Platelets 291; Potassium 4.0; Sodium 137    Lipid Panel No results found for: CHOL, TRIG, HDL, CHOLHDL, VLDL, LDLCALC, LDLDIRECT    Wt Readings from Last 3 Encounters:  06/04/19 173 lb 2 oz (78.5 kg)  05/29/19 170 lb (77.1 kg)  02/24/19 169 lb 3.2 oz (76.7 kg)      Other studies Reviewed: Additional studies/ records that were reviewed today include:  Labs dated 05/29/17: cholesterol 163, triglycerides 74, HDL 63, LDL 85. LFTs and TSH normal Dated 05/29/18: cholesterol 129, triglycerides 82, HDL 55, LDL 58. Normal CBC, Chemistries, TSH. BNP 143.  Dated 07/22/18: Normal CBC and BMET Dated 05/29/19: A1c 6.3%. cholesterol 170, triglycerides 115, HDL 58, LDL 90. CMET, CBC nad TSH normal  Ecg today shows sinus brady 46 bpm, LBBB. I have personally reviewed and interpreted this study.   Myoview 10/23/17: Study Highlights    Nuclear stress EF: 51%.  The left ventricular ejection fraction is mildly decreased (45-54%).  There was no ST segment deviation noted during stress.  Defect 1: There is a small defect of moderate severity present in the mid anterior location. This is due to breast attenuation artifact.  This is a low risk study.  Septal wall hypokinesis consistent with left bundle branch block.    Echo 06/09/18: Study Conclusions  - Left  ventricle: The cavity size was normal. Systolic function was   mildly to moderately reduced. The estimated ejection fraction was   in the range of 40% to 45%. Diffuse hypokinesis. Regional wall   motion abnormalities cannot be excluded. The study is not   technically sufficient to allow evaluation of LV diastolic   function. - Ventricular septum: Septal motion showed dyssynergy. These   changes are consistent with a left bundle branch block. - Mitral valve: Calcified annulus. There was trivial regurgitation. - Left atrium: The atrium was moderately dilated. - Right ventricle: Systolic function was mildly reduced. - Atrial septum: No defect or patent foramen ovale  was identified. - Tricuspid valve: There was moderate regurgitation. - Pulmonic valve: There was mild regurgitation. - Pulmonary arteries: Systolic pressure was mildly increased.  Impressions:  - Technically difficult study, echo contrast used to opacify LV.     Appears to have diffuse mild-moderate hypokinesis, worst at the   apex and anterior/anteroseptal regions but seen across multiple   coronary distributions. Also has dyssynchrony due to LBBB. Given   distribution, favor LBBB-related but cannot completely excluded   CAD as cause.   ASSESSMENT AND PLAN:  1. Atrial fibrillation s/p DCCV on July 30, 2018. On metoprolol. [place] Vasc score of 3. On Eliquis at appropriate dose.  Echo showed mild LV dysfunction with dyssynergy due to LBBB. Moderate LAE.  Maintaining NSR. Now with marked sinus brady. Will reduce metoprolol to 12.5 mg bid.   2. HTN. Now well controlled.  3. Hypercholesterolemia. On statin- excellent control  4. Chronic LBBB.- normal Myoview in January 2019.   Follow up 6 months    Signed, Lin Glazier [name], MD , [facility] 06/04/2019 8:40 AM    [facility] [address] [place], [place], [address] Phone [phone], Fax [phone]

## 2019-05-28 NOTE — Telephone Encounter (Signed)

## 2019-05-29 ENCOUNTER — Other Ambulatory Visit: Payer: Self-pay

## 2019-05-29 ENCOUNTER — Encounter: Payer: Self-pay | Admitting: Plastic Surgery

## 2019-05-29 ENCOUNTER — Ambulatory Visit (INDEPENDENT_AMBULATORY_CARE_PROVIDER_SITE_OTHER): Payer: Medicare Other | Admitting: Plastic Surgery

## 2019-05-29 VITALS — BP 161/79 | Temp 98.8°F | Resp 16 | Ht 65.0 in | Wt 170.0 lb

## 2019-05-29 DIAGNOSIS — I4891 Unspecified atrial fibrillation: Secondary | ICD-10-CM | POA: Diagnosis not present

## 2019-05-29 DIAGNOSIS — H6120 Impacted cerumen, unspecified ear: Secondary | ICD-10-CM | POA: Diagnosis not present

## 2019-05-29 DIAGNOSIS — Z853 Personal history of malignant neoplasm of breast: Secondary | ICD-10-CM | POA: Diagnosis not present

## 2019-05-29 DIAGNOSIS — R899 Unspecified abnormal finding in specimens from other organs, systems and tissues: Secondary | ICD-10-CM | POA: Diagnosis not present

## 2019-05-29 DIAGNOSIS — Z23 Encounter for immunization: Secondary | ICD-10-CM | POA: Diagnosis not present

## 2019-05-29 DIAGNOSIS — Z87442 Personal history of urinary calculi: Secondary | ICD-10-CM | POA: Diagnosis not present

## 2019-05-29 DIAGNOSIS — M810 Age-related osteoporosis without current pathological fracture: Secondary | ICD-10-CM | POA: Diagnosis not present

## 2019-05-29 DIAGNOSIS — R7301 Impaired fasting glucose: Secondary | ICD-10-CM | POA: Diagnosis not present

## 2019-05-29 DIAGNOSIS — L570 Actinic keratosis: Secondary | ICD-10-CM | POA: Diagnosis not present

## 2019-05-29 DIAGNOSIS — K219 Gastro-esophageal reflux disease without esophagitis: Secondary | ICD-10-CM | POA: Diagnosis not present

## 2019-05-29 DIAGNOSIS — I10 Essential (primary) hypertension: Secondary | ICD-10-CM | POA: Diagnosis not present

## 2019-05-29 DIAGNOSIS — E782 Mixed hyperlipidemia: Secondary | ICD-10-CM | POA: Diagnosis not present

## 2019-05-29 NOTE — Progress Notes (Signed)
   Subjective:    Patient ID: Lisa [name], female    DOB: 11-09-1940, 78 y.o.   MRN: [id number]  The patient is a 78 year old female here with her husband for follow-up on an actinic keratosis of her right cheek.  She used the Efudex and had a very strong response.  She then used some Neosporin to calm it down and this created a very bad reaction.  We put her on some antibiotics and it completely cleared up.  She is very pleased with the results.  She does have some scarring there.  This will likely take some time to fade.  There is no sign of infection at this time.  There are no other concerning skin lesions on her face.   Review of Systems  Constitutional: Negative.   HENT: Negative.   Eyes: Negative.   Respiratory: Negative.   Cardiovascular: Negative.   Gastrointestinal: Negative.   Endocrine: Negative.   Genitourinary: Negative.   Hematological: Negative.   Psychiatric/Behavioral: Negative.        Objective:   Physical Exam Vitals signs and nursing note reviewed.  Constitutional:      Appearance: Normal appearance.  HENT:     Head: Normocephalic.  Neck:     Musculoskeletal: Normal range of motion.  Cardiovascular:     Rate and Rhythm: Normal rate.  Pulmonary:     Effort: Pulmonary effort is normal.  Neurological:     General: No focal deficit present.     Mental Status: She is alert.  Psychiatric:        Mood and Affect: Mood normal.        Thought Content: Thought content normal.        Judgment: Judgment normal.       Assessment & Plan:     ICD-10-CM   1. 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  Recommend avoiding the sun.  Can also do some laser in the winter to see if we can improve the coloring of her cheeks.  She is going to make an appointment before she leaves to have that done in the next couple of months.

## 2019-06-04 ENCOUNTER — Encounter: Payer: Self-pay | Admitting: Cardiology

## 2019-06-04 ENCOUNTER — Other Ambulatory Visit: Payer: Self-pay

## 2019-06-04 ENCOUNTER — Ambulatory Visit (INDEPENDENT_AMBULATORY_CARE_PROVIDER_SITE_OTHER): Payer: Medicare Other | Admitting: Cardiology

## 2019-06-04 ENCOUNTER — Telehealth: Payer: Self-pay | Admitting: Cardiology

## 2019-06-04 VITALS — BP 142/72 | HR 46 | Ht 65.0 in | Wt 173.1 lb

## 2019-06-04 DIAGNOSIS — I10 Essential (primary) hypertension: Secondary | ICD-10-CM | POA: Diagnosis not present

## 2019-06-04 DIAGNOSIS — I480 Paroxysmal atrial fibrillation: Secondary | ICD-10-CM | POA: Diagnosis not present

## 2019-06-04 DIAGNOSIS — I447 Left bundle-branch block, unspecified: Secondary | ICD-10-CM | POA: Diagnosis not present

## 2019-06-04 MED ORDER — METOPROLOL TARTRATE 25 MG PO TABS: ORAL_TABLET | ORAL | 3 refills | Status: DC

## 2019-06-04 NOTE — Patient Instructions (Signed)
Reduce metoprolol to 12.5 mg twice a day

## 2019-06-04 NOTE — Addendum Note (Signed)
Addended by: Kathyrn Lass on: 06/04/2019 09:20 AM   Modules accepted: Orders

## 2019-06-04 NOTE — Telephone Encounter (Signed)
Spoke to patient advised to take Metoprolol 25 mg 1/2 tablet twice a day.

## 2019-06-04 NOTE — Telephone Encounter (Signed)
New Message:    Please call,concerning her Metoprolol 12.5 mg. She said they were not at the pharmacy any pharmacist said Metoprolol did not come in 12.5 mg.Marland Kitchen

## 2019-06-17 ENCOUNTER — Telehealth: Payer: Self-pay

## 2019-06-17 NOTE — Telephone Encounter (Signed)
Left message on personal voice mail Eliquis 5 mg samples left at [facility] office front desk. 

## 2019-06-26 ENCOUNTER — Other Ambulatory Visit: Payer: Self-pay

## 2019-06-26 ENCOUNTER — Ambulatory Visit
Admission: RE | Admit: 2019-06-26 | Discharge: 2019-06-26 | Disposition: A | Discharge status: Home / Self Care | Payer: Medicare Other | Source: Ambulatory Visit | Attending: Family Medicine | Admitting: Family Medicine

## 2019-06-26 DIAGNOSIS — Z1231 Encounter for screening mammogram for malignant neoplasm of breast: Secondary | ICD-10-CM

## 2019-07-10 ENCOUNTER — Other Ambulatory Visit: Payer: Self-pay

## 2019-07-10 ENCOUNTER — Ambulatory Visit (INDEPENDENT_AMBULATORY_CARE_PROVIDER_SITE_OTHER): Payer: Self-pay | Admitting: Plastic Surgery

## 2019-07-10 ENCOUNTER — Encounter: Payer: Self-pay | Admitting: Plastic Surgery

## 2019-07-10 DIAGNOSIS — Z719 Counseling, unspecified: Secondary | ICD-10-CM | POA: Insufficient documentation

## 2019-07-10 NOTE — Progress Notes (Signed)
Preoperative Dx: Hyperpigmentation  Postoperative Dx:  same  Procedure: laser to face  Anesthesia: none  Description of Procedure:  Risks and complications were explained to the patient. Consent was confirmed and signed. Time out was called and all information was confirmed to be correct. The area  area was prepped with alcohol and wiped dry. The IPL laser was set at 7 J/cm2. The face was lasered. The patient tolerated the procedure well and there were no complications. The patient is to follow up in 4 weeks.

## 2019-08-06 ENCOUNTER — Telehealth: Payer: Self-pay | Admitting: Cardiology

## 2019-08-06 ENCOUNTER — Other Ambulatory Visit: Payer: Self-pay

## 2019-08-06 MED ORDER — APIXABAN 5 MG PO TABS: 5.0000 mg | ORAL_TABLET | Freq: Two times a day (BID) | ORAL | 3 refills | Status: DC

## 2019-08-06 NOTE — Telephone Encounter (Signed)
41f 77.1kg Scr 0.86 Lovw/jordan 06/04/19

## 2019-08-06 NOTE — Telephone Encounter (Signed)
New message   Patient states that a representative from Eliquis faxed information to office. Please call to discuss.

## 2019-08-07 NOTE — Telephone Encounter (Signed)
Eliquis 5 mg samples left at [employer] office front desk.Patient was notified via my chart.

## 2019-08-17 DIAGNOSIS — H52203 Unspecified astigmatism, bilateral: Secondary | ICD-10-CM | POA: Diagnosis not present

## 2019-08-17 DIAGNOSIS — H524 Presbyopia: Secondary | ICD-10-CM | POA: Diagnosis not present

## 2019-08-17 DIAGNOSIS — H2513 Age-related nuclear cataract, bilateral: Secondary | ICD-10-CM | POA: Diagnosis not present

## 2019-08-17 DIAGNOSIS — H5203 Hypermetropia, bilateral: Secondary | ICD-10-CM | POA: Diagnosis not present

## 2019-08-31 ENCOUNTER — Other Ambulatory Visit: Payer: Self-pay

## 2019-08-31 MED ORDER — LOSARTAN POTASSIUM 100 MG PO TABS: 100.0000 mg | ORAL_TABLET | Freq: Every day | ORAL | 2 refills | Status: DC

## 2019-08-31 MED ORDER — HYDROCHLOROTHIAZIDE 25 MG PO TABS: 25.0000 mg | ORAL_TABLET | Freq: Every day | ORAL | 3 refills | Status: DC

## 2019-10-22 ENCOUNTER — Telehealth: Payer: Self-pay | Admitting: Cardiology

## 2019-10-22 NOTE — Telephone Encounter (Signed)
New message  We are recommending the COVID-19 vaccine to all of our patients. Cardiac medications (including blood thinners) should not deter anyone from being vaccinated and there is no need to hold any of those medications prior to vaccine administration.     Currently, there is a hotline to call (active 10/02/19) to schedule vaccination appointments as no walk-ins will be accepted.   Number: [phone].    If an appointment is not available please go to [email or web address] to sign up for notification when additional vaccine appointments are available.   If you have further questions or concerns about the vaccine process, please visit [email or web address] or contact your primary care physician.

## 2019-12-02 ENCOUNTER — Telehealth: Payer: Self-pay

## 2019-12-02 NOTE — Progress Notes (Signed)
Virtual Visit via Telephone Note   This visit type was conducted due to national recommendations for restrictions regarding the COVID-19 Pandemic (e.g. social distancing) in an effort to limit this patient's exposure and mitigate transmission in our community.  Due to her co-morbid illnesses, this patient is at least at moderate risk for complications without adequate follow up.  This format is felt to be most appropriate for this patient at this time.  The patient did not have access to video technology/had technical difficulties with video requiring transitioning to audio format only (telephone).  All issues noted in this document were discussed and addressed.  No physical exam could be performed with this format.  Please refer to the patient's chart for her  consent to telehealth for [facility].   The patient was identified using 2 identifiers.  Date:  12/03/2019   ID:  Lisa [name], DOB 26-Aug-1941, MRN [id number]  Patient Location: Home Provider Location: Home  PCP:  Hulan Fess, MD  Cardiologist:  Shanita Kanan [name], MD  Electrophysiologist:  None   Evaluation Performed:  Follow-Up Visit  Chief Complaint:  afib  History of Present Illness:    Lisa [name] is a 79 y.o. female with She has a history of Afib,  HTN, HLD, and LBBB. She was seen November 2018 with chest pain. Troponins were negative. She was hypertensive. Started on anti reflux therapy with improvement in symptoms. Subsequent Myoview in January 2019 was normal. Prior  Echo and Adenosine Myoview in 2006 were normal.   In August 2019 she noted sudden onset of SOB. Noted some swelling. Did not really feel palpitations. She initially had bad indigestion but this resolved. She was seen by Dr. Rex Kras  and found to be in AFib. Rate 97. Treated with  Eliquis, metoprolol and lasix. On 07/30/18 she underwent successful DCCV.   On follow up today she is doing very well. No palpitations. BP controlled. She is staying active. We  previously had reduced metoprolol due to bradycardia. She noted much more palpitations so went back up to 25 mg in the am and 12.5 mg in the pm with resolutions of her palpitations.  The patient does not have symptoms concerning for COVID-19 infection (fever, chills, cough, or new shortness of breath).    Past Medical History:  Diagnosis Date  . Bundle branch block, left   . Dyslipidemia   . GERD (gastroesophageal reflux disease)   . Hypercholesteremia   . Hypertension   . LBBB (left bundle branch block)   . UTI (urinary tract infection)    Past Surgical History:  Procedure Laterality Date  . bladder polyp removal    . BREAST LUMPECTOMY Left    No Visible Scar, said 30 + years ago in situ, no chemo, no radiation, or hormone replacement    . CARDIOVERSION N/A 07/30/2018   Procedure: CARDIOVERSION;  Surgeon: Jerline Pain, MD;  Location: [facility] ENDOSCOPY;  Service: Cardiovascular;  Laterality: N/A;  . CESAREAN SECTION       Current Meds  Medication Sig  . apixaban (ELIQUIS) 5 MG TABS tablet Take 1 tablet (5 mg total) by mouth 2 (two) times daily.  . calcium carbonate (TUMS - DOSED IN MG ELEMENTAL CALCIUM) 500 MG chewable tablet Chew 1 tablet by mouth daily as needed for indigestion or heartburn.  . Ergocalciferol (VITAMIN D2) 2000 units TABS Take 2,000 Units by mouth daily.  . furosemide (LASIX) 20 MG tablet Take 20 mg by mouth daily.   . hydrochlorothiazide (HYDRODIURIL)  25 MG tablet Take 1 tablet (25 mg total) by mouth daily.  Marland Kitchen losartan (COZAAR) 100 MG tablet Take 1 tablet (100 mg total) by mouth daily.  . metoprolol tartrate (LOPRESSOR) 25 MG tablet Take 25 mg 1 tablet in am and 12.5 mg 1/2 tablet in pm  . pantoprazole (PROTONIX) 40 MG tablet Take 40 mg by mouth daily as needed for heartburn.  . simvastatin (ZOCOR) 40 MG tablet Take 40 mg by mouth daily.  . [DISCONTINUED] metoprolol tartrate (LOPRESSOR) 25 MG tablet Take 1/2 tablet ( 12.5 mg ) twice a day     Allergies:   Benicar  [olmesartan], Codeine, Fosamax [alendronate sodium], Lisinopril, and Norvasc [amlodipine]   Social History   Tobacco Use  . Smoking status: Former Smoker    Years: 20.00    Quit date: 1993    Years since quitting: 28.2  . Smokeless tobacco: Never Used  Substance Use Topics  . Alcohol use: Yes  . Drug use: No     Family Hx: The patient's family history includes Breast cancer in her mother; CVA in her brother and brother; Crohn's disease in her brother; Diabetes in her brother; Hip fracture in her mother; Other in her brother; Renal cancer in her brother; Ulcers in her father.  ROS:   Please see the history of present illness.    All other systems reviewed and are negative.   Prior CV studies:   The following studies were reviewed today:  Myoview 10/23/17: Study Highlights    Nuclear stress EF: 51%.  The left ventricular ejection fraction is mildly decreased (45-54%).  There was no ST segment deviation noted during stress.  Defect 1: There is a small defect of moderate severity present in the mid anterior location. This is due to breast attenuation artifact.  This is a low risk study.  Septal wall hypokinesis consistent with left bundle branch block.    Echo 06/09/18: Study Conclusions  - Left ventricle: The cavity size was normal. Systolic function was mildly to moderately reduced. The estimated ejection fraction was in the range of 40% to 45%. Diffuse hypokinesis. Regional wall motion abnormalities cannot be excluded. The study is not technically sufficient to allow evaluation of LV diastolic function. - Ventricular septum: Septal motion showed dyssynergy. These changes are consistent with a left bundle branch block. - Mitral valve: Calcified annulus. There was trivial regurgitation. - Left atrium: The atrium was moderately dilated. - Right ventricle: Systolic function was mildly reduced. - Atrial septum: No defect or patent foramen ovale was  identified. - Tricuspid valve: There was moderate regurgitation. - Pulmonic valve: There was mild regurgitation. - Pulmonary arteries: Systolic pressure was mildly increased.  Impressions:  - Technically difficult study, echo contrast used to opacify LV.  Appears to have diffuse mild-moderate hypokinesis, worst at the apex and anterior/anteroseptal regions but seen across multiple coronary distributions. Also has dyssynchrony due to LBBB. Given distribution, favor LBBB-related but cannot completely excluded CAD as cause.   Labs/Other Tests and Data Reviewed:    EKG:  No ECG reviewed.  Recent Labs: No results found for requested labs within last 8760 hours.   Recent Lipid Panel No results found for: CHOL, TRIG, HDL, CHOLHDL, LDLCALC, LDLDIRECT  Wt Readings from Last 3 Encounters:  12/03/19 168 lb (76.2 kg)  07/10/19 170 lb (77.1 kg)  06/04/19 173 lb 2 oz (78.5 kg)    Labs dated 05/29/17: cholesterol 163, triglycerides 74, HDL 63, LDL 85. LFTs and TSH normal Dated 05/29/18: cholesterol 129,  triglycerides 82, HDL 55, LDL 58. Normal CBC, Chemistries, TSH. BNP 143.  Dated 07/22/18: Normal CBC and BMET Dated 05/29/19: A1c 6.3%. cholesterol 170, triglycerides 115, HDL 58, LDL 90. CMET, CBC nad TSH normal   Objective:    Vital Signs:  BP 129/75   Pulse 69   Ht 5\' 5"  (1.651 m)   Wt 168 lb (76.2 kg)   BMI 27.96 kg/m    VITAL SIGNS:  reviewed  ASSESSMENT & PLAN:    1. Atrial fibrillation s/p DCCV on July 30, 2018. On metoprolol. [place] Vasc score of 3. On Eliquis at appropriate dose.  Echo showed mild LV dysfunction with dyssynergy due to LBBB. Moderate LAE.  Maintaining NSR. Continue current dose of metoprolol    2. HTN. Now well controlled.  3. Hypercholesterolemia. On statin- good control  4. Chronic LBBB.- normal Myoview in January 2019.   COVID-19 Education: The signs and symptoms of COVID-19 were discussed with the patient and how to seek care for  testing (follow up with PCP or arrange E-visit).  The importance of social distancing was discussed today.  Time:   Today, I have spent 10 minutes with the patient with telehealth technology discussing the above problems.     Medication Adjustments/Labs and Tests Ordered: Current medicines are reviewed at length with the patient today.  Concerns regarding medicines are outlined above.   Tests Ordered: No orders of the defined types were placed in this encounter.   Medication Changes: No orders of the defined types were placed in this encounter.   Follow Up:  In Person in 6 month(s)  Signed, Timur Nibert [name], MD  12/03/2019 9:08 AM    [place]

## 2019-12-02 NOTE — Telephone Encounter (Signed)
Spoke to patient Eliquis 5 mg samples left at [facility] office front desk. 

## 2019-12-03 ENCOUNTER — Encounter: Payer: Self-pay | Admitting: Cardiology

## 2019-12-03 ENCOUNTER — Ambulatory Visit: Payer: Medicare Other | Admitting: Cardiology

## 2019-12-03 ENCOUNTER — Telehealth (INDEPENDENT_AMBULATORY_CARE_PROVIDER_SITE_OTHER): Payer: Medicare Other | Admitting: Cardiology

## 2019-12-03 VITALS — BP 129/75 | HR 69 | Ht 65.0 in | Wt 168.0 lb

## 2019-12-03 DIAGNOSIS — I10 Essential (primary) hypertension: Secondary | ICD-10-CM

## 2019-12-03 DIAGNOSIS — E7800 Pure hypercholesterolemia, unspecified: Secondary | ICD-10-CM

## 2019-12-03 DIAGNOSIS — I480 Paroxysmal atrial fibrillation: Secondary | ICD-10-CM | POA: Diagnosis not present

## 2019-12-03 DIAGNOSIS — I447 Left bundle-branch block, unspecified: Secondary | ICD-10-CM

## 2019-12-03 NOTE — Patient Instructions (Signed)
Medication Instructions:  Continue same medications *If you need a refill on your cardiac medications before your next appointment, please call your pharmacy*   Lab Work: None ordered   Testing/Procedures:    Follow-Up: At [facility], you and your health needs are our priority.  As part of our continuing mission to provide you with exceptional heart care, we have created designated Provider Care Teams.  These Care Teams include your primary Cardiologist (physician) and Advanced Practice Providers (APPs -  Physician Assistants and Nurse Practitioners) who all work together to provide you with the care you need, when you need it.  We recommend signing up for the patient portal called "MyChart".  Sign up information is provided on this After Visit Summary.  MyChart is used to connect with patients for Virtual Visits (Telemedicine).  Patients are able to view lab/test results, encounter notes, upcoming appointments, etc.  Non-urgent messages can be sent to your provider as well.   To learn more about what you can do with MyChart, go to [email or web address].      Your next appointment:  6 months    Call in July to schedule Sept appointment    The format for your next appointment: Office     Provider:  Dr.Jordan

## 2019-12-16 ENCOUNTER — Other Ambulatory Visit: Payer: Self-pay | Admitting: Cardiology

## 2020-01-20 ENCOUNTER — Telehealth: Payer: Self-pay

## 2020-01-20 NOTE — Telephone Encounter (Signed)
Spoke to patient's husband Eliquis 5 mg samples left at [facility] office front desk.

## 2020-02-25 ENCOUNTER — Other Ambulatory Visit: Payer: Self-pay | Admitting: Cardiology

## 2020-03-11 ENCOUNTER — Telehealth: Payer: Self-pay | Admitting: Cardiology

## 2020-03-11 ENCOUNTER — Other Ambulatory Visit: Payer: Self-pay | Admitting: Cardiology

## 2020-03-11 MED ORDER — APIXABAN 5 MG PO TABS: 5.0000 mg | ORAL_TABLET | Freq: Two times a day (BID) | ORAL | 3 refills | Status: DC

## 2020-03-11 NOTE — Telephone Encounter (Signed)
New message   *STAT* If patient is at the pharmacy, call can be transferred to refill team.   1. Which medications need to be refilled? (please list name of each medication and dose if known) ELIQUIS 5 MG TABS tablet  2. Which pharmacy/location (including street and city if local pharmacy) is medication to be sent to? [facility] [place], [place] AT Portal to Registered [employer] Sites  3. Do they need a 30 day or 90 day supply? 90 day

## 2020-03-11 NOTE — Telephone Encounter (Signed)
Spoke with Lisa [name] at [place]. Answered questions about HCTZ and Furosemide. Furosemide was self-reported, historical provider.   No recent lab work to determine potassium need but patient is due to office visit in September.   Patient requesting update on patient assistance for Eliquis. Will route to primary nurse.

## 2020-03-11 NOTE — Telephone Encounter (Signed)
New Message  Pt c/o medication issue:  1. Name of Medication: furosemide (LASIX) 20 MG tablet hydrochlorothiazide (HYDRODIURIL) 25 MG tablet  2. How are you currently taking this medication (dosage and times per day)? As directed  3. Are you having a reaction (difficulty breathing--STAT)? n/a  4. What is your medication issue? Nurse from [facility] is calling in on the behalf of their pharmacist to discussion medications. Should patient be taking both, should patient be on potassium supplement. Please give Wells Guiles a call back.

## 2020-03-14 MED ORDER — APIXABAN 5 MG PO TABS: 5.0000 mg | ORAL_TABLET | Freq: Two times a day (BID) | ORAL | 3 refills | Status: DC

## 2020-03-16 NOTE — Telephone Encounter (Signed)
Spoke to patient advised I never heard back from patient assistance for Eliquis, we will fill out a new patient assistance for Eliquis.I will check for eliquis samples and call Lisa [name] back tomorrow.

## 2020-03-21 MED ORDER — APIXABAN 5 MG PO TABS: 5.0000 mg | ORAL_TABLET | Freq: Two times a day (BID) | ORAL | 3 refills | Status: DC

## 2020-03-21 NOTE — Addendum Note (Signed)
Addended by: Kathyrn Lass on: 03/21/2020 03:54 PM   Modules accepted: Orders

## 2020-03-21 NOTE — Telephone Encounter (Signed)
Spoke to patient Eliquis 2.5 mg samples left at [employer] [facility] [employer] office front desk.Advised to take 2 tablets to equal 5 mg.Also patient assistance form for Eliquis enclosed.Stated she will complete and bring back to office tomorrow along with proof of her income.

## 2020-03-21 NOTE — Telephone Encounter (Signed)
Follow up   Pt is calling back, she said she filled up her paper work for her pt assistance for eliquis and she has a question about it. She would like for [facility] to call her back

## 2020-03-21 NOTE — Telephone Encounter (Signed)
Spoke to patient Eliquis patient assistance paperwork completed.Faxed to fax # [phone].

## 2020-04-06 ENCOUNTER — Telehealth: Payer: Self-pay

## 2020-04-06 NOTE — Telephone Encounter (Signed)
Spoke to patient advised we received a fax from [place] patient assistance advising you are not eligible for assistance with Eliquis.She will discuss with Dr.Jordan at next appointment about coumadin.

## 2020-04-21 IMAGING — MG DIGITAL SCREENING BILATERAL MAMMOGRAM WITH TOMO AND CAD
8 series · 8 of 24 positions shown · non-contrast
Comparison: Previous exam(s).

CLINICAL DATA: Screening.

EXAM:
DIGITAL SCREENING BILATERAL MAMMOGRAM WITH TOMO AND CAD

[L MLO synth-2D]
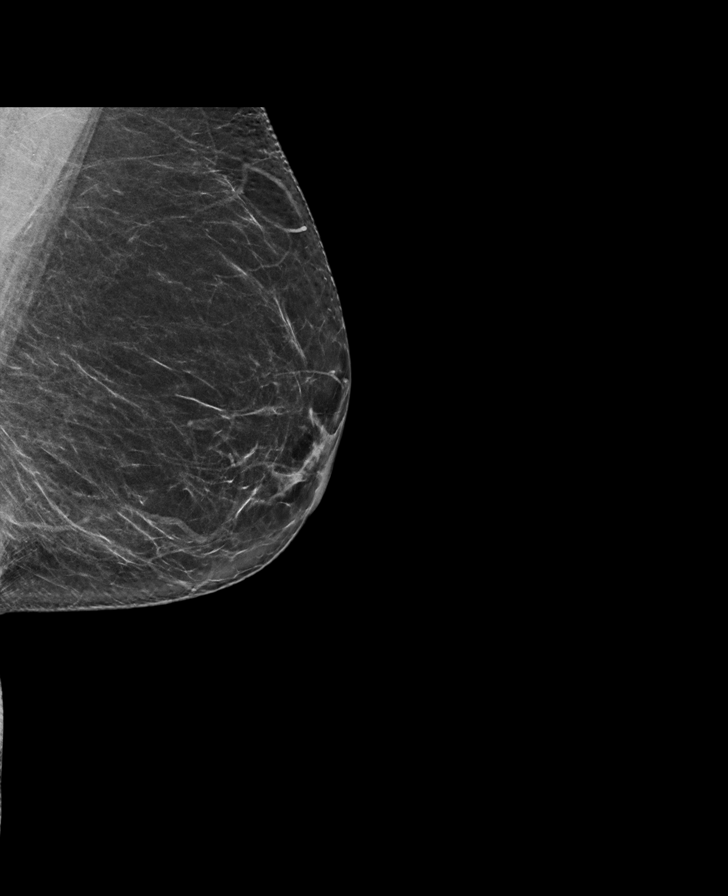

[R CC synth-2D]
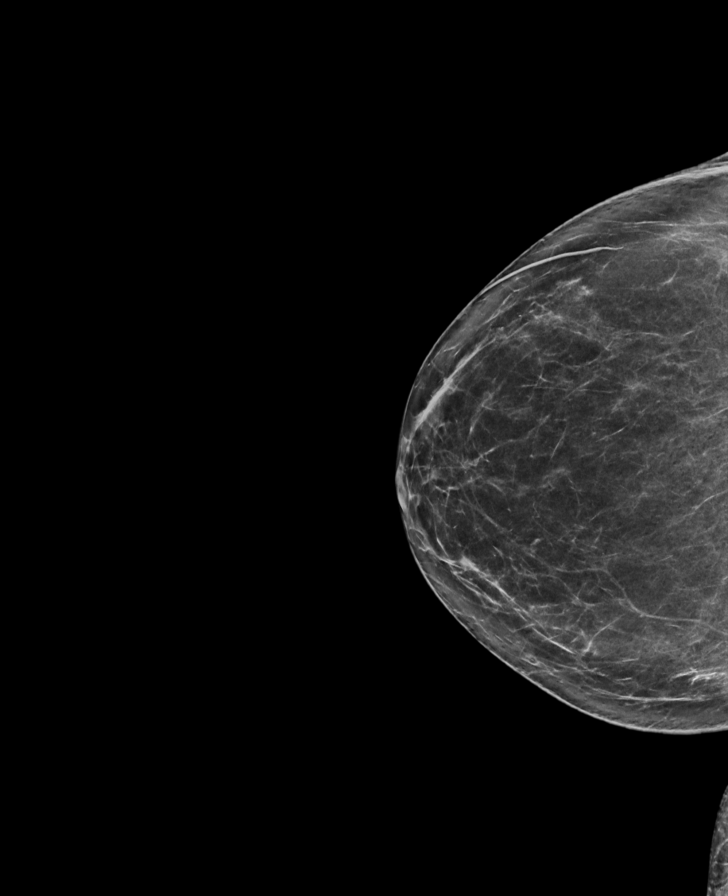

[L CC synth-2D]
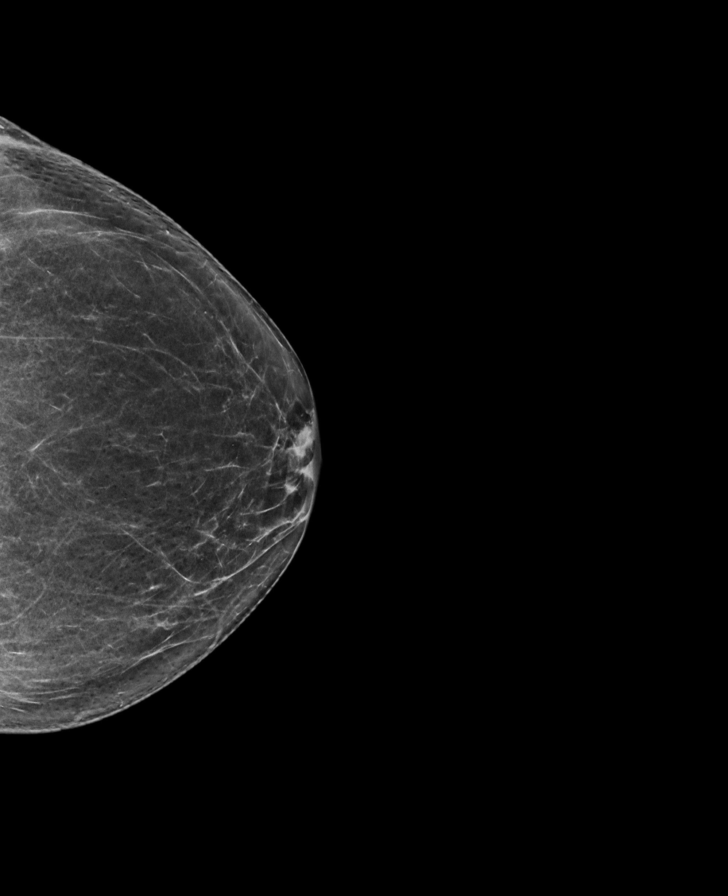

[R MLO synth-2D]
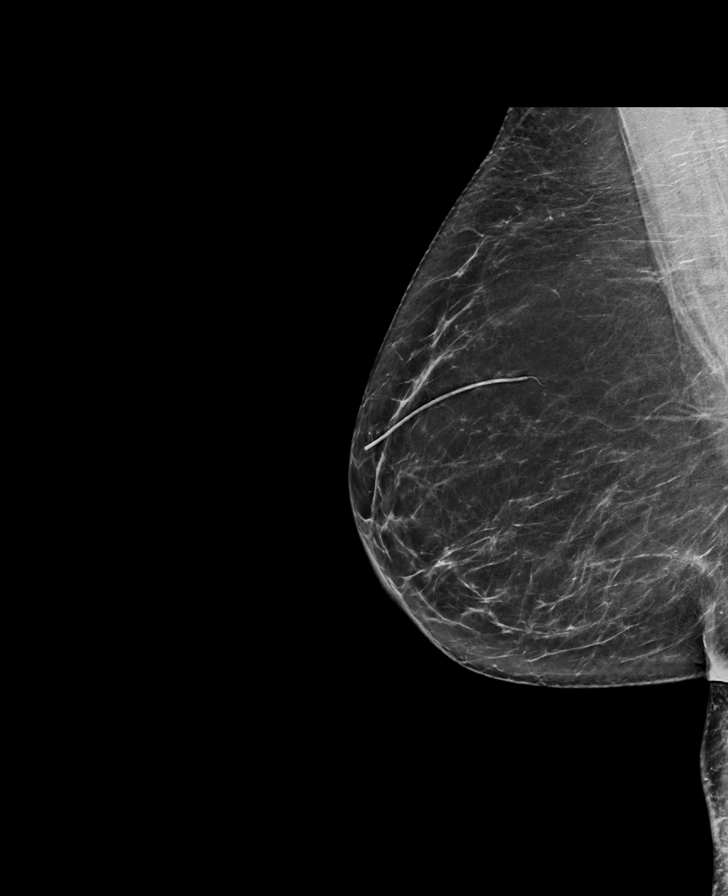

[L MLO tomo · tomo slice 35/70.0]
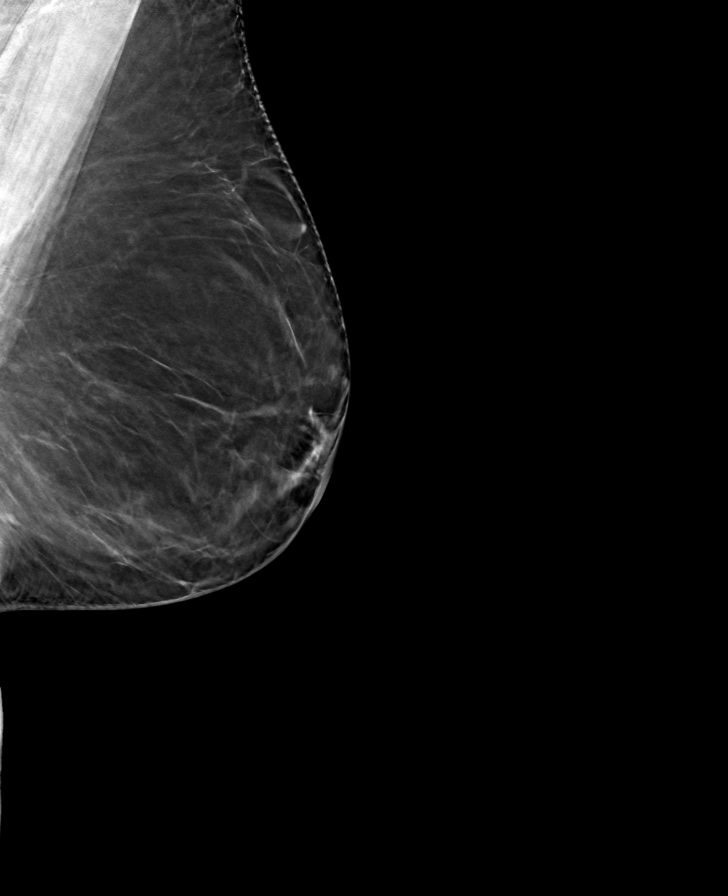

[L CC tomo · tomo slice 33/66.0]
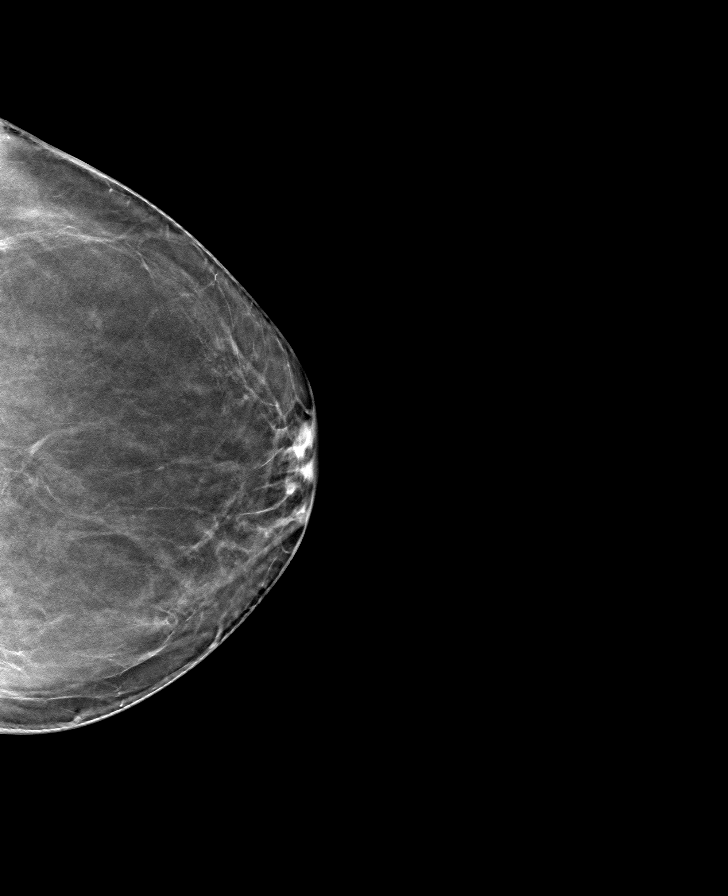

[R CC tomo · tomo slice 36/71.0]
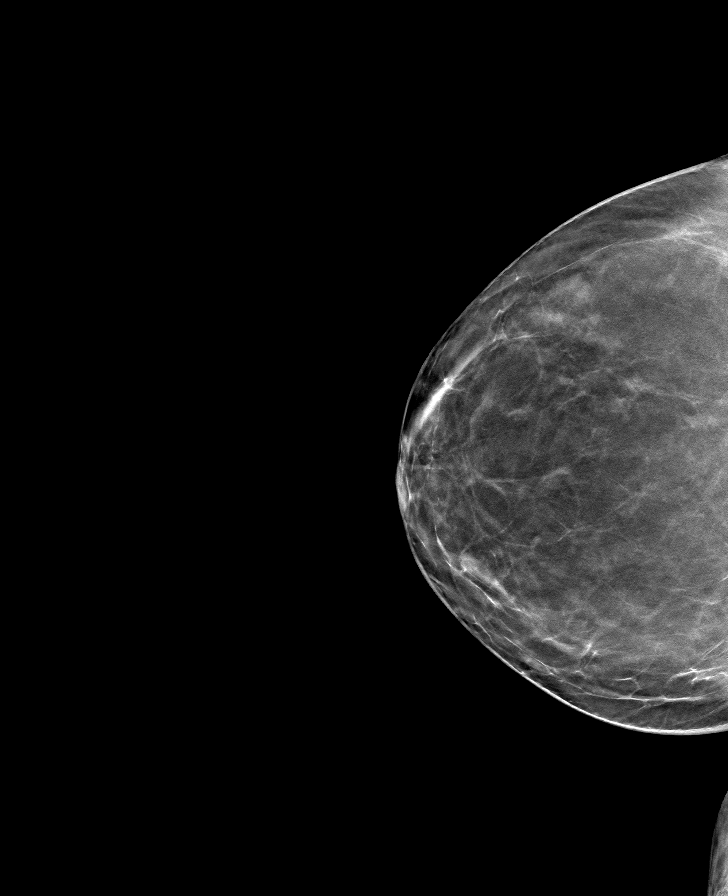

[R MLO tomo · tomo slice 38/75.0]
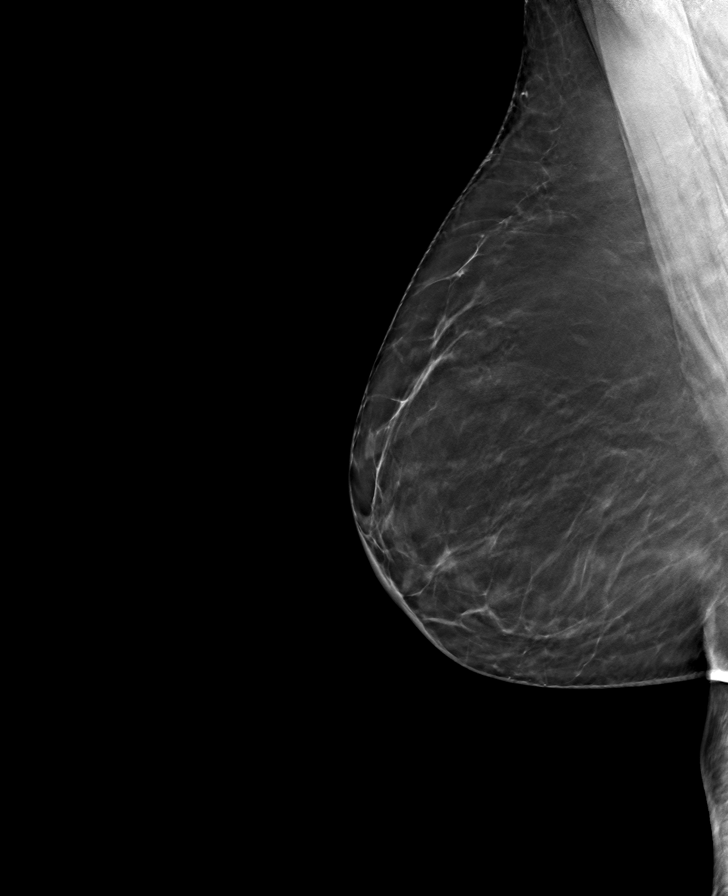

[8 of 24 positions shown; findings below may reference images not displayed]

ACR Breast Density Category b: There are scattered areas of
fibroglandular density.
FINDINGS: There are no findings suspicious for malignancy. Images were
processed with CAD.
IMPRESSION: No mammographic evidence of malignancy. A result letter of this
screening mammogram will be mailed directly to the patient.

RECOMMENDATION:
Screening mammogram in one year. (Code:[id number]-775)

BI-RADS CATEGORY  1: Negative.

## 2020-04-22 DIAGNOSIS — I4891 Unspecified atrial fibrillation: Secondary | ICD-10-CM | POA: Diagnosis not present

## 2020-04-22 DIAGNOSIS — M810 Age-related osteoporosis without current pathological fracture: Secondary | ICD-10-CM | POA: Diagnosis not present

## 2020-04-22 DIAGNOSIS — Z853 Personal history of malignant neoplasm of breast: Secondary | ICD-10-CM | POA: Diagnosis not present

## 2020-04-22 DIAGNOSIS — I10 Essential (primary) hypertension: Secondary | ICD-10-CM | POA: Diagnosis not present

## 2020-04-22 DIAGNOSIS — E782 Mixed hyperlipidemia: Secondary | ICD-10-CM | POA: Diagnosis not present

## 2020-05-23 ENCOUNTER — Other Ambulatory Visit: Payer: Self-pay | Admitting: Family Medicine

## 2020-05-23 DIAGNOSIS — Z0000 Encounter for general adult medical examination without abnormal findings: Secondary | ICD-10-CM

## 2020-05-31 NOTE — Progress Notes (Signed)
Cardiology Office Note   Date:  06/01/2020   ID:  ZEIDY TAYAG, DOB 1941-01-17, MRN [id number]  PCP:  Hulan Fess, MD  Cardiologist:   Dwain Huhn [name], MD   Chief Complaint  Patient presents with  . Palpitations  . Atrial Fibrillation      History of Present Illness: Lisa [name] is a 79 y.o. female who is seen for follow up  Afib.  She has a history of HTN, HLD, and LBBB. She was seen November 2018 with chest pain. Troponins were negative. She was hypertensive. Started on anti reflux therapy with improvement in symptoms. Subsequent Myoview in January 2019 was normal. Prior  Echo and Adenosine Myoview in 2006 were normal.   In August 2019 she noted sudden onset of SOB. Noted some swelling. Did not really feel palpitations. She initially had bad indigestion but this resolved. She was seen by Dr. Rex Kras  and found to be in AFib. Rate 97. Treated with  Eliquis, metoprolol and lasix. On 07/30/18 she underwent successful DCCV.   On follow up today she is doing well. No chest pain. Only gets out of breath walking up stairs. Does yard work without difficulty. Has only noted 2 episodes of palpitations in the last 6 months. BP at home 130/70.       Past Medical History:  Diagnosis Date  . Bundle branch block, left   . Dyslipidemia   . GERD (gastroesophageal reflux disease)   . Hypercholesteremia   . Hypertension   . LBBB (left bundle branch block)   . UTI (urinary tract infection)     Past Surgical History:  Procedure Laterality Date  . bladder polyp removal    . BREAST LUMPECTOMY Left    No Visible Scar, said 30 + years ago in situ, no chemo, no radiation, or hormone replacement    . CARDIOVERSION N/A 07/30/2018   Procedure: CARDIOVERSION;  Surgeon: Jerline Pain, MD;  Location: [facility] ENDOSCOPY;  Service: Cardiovascular;  Laterality: N/A;  . CESAREAN SECTION       Current Outpatient Medications  Medication Sig Dispense Refill  . apixaban (ELIQUIS) 5 MG TABS tablet Take 1  tablet (5 mg total) by mouth 2 (two) times daily. 180 tablet 3  . calcium carbonate (TUMS - DOSED IN MG ELEMENTAL CALCIUM) 500 MG chewable tablet Chew 1 tablet by mouth daily as needed for indigestion or heartburn.    . Ergocalciferol (VITAMIN D2) 2000 units TABS Take 2,000 Units by mouth daily.    . furosemide (LASIX) 20 MG tablet Take 20 mg by mouth daily.   0  . hydrochlorothiazide (HYDRODIURIL) 25 MG tablet Take 1 tablet (25 mg total) by mouth daily. 90 tablet 3  . losartan (COZAAR) 100 MG tablet TAKE 1 TABLET BY MOUTH EVERY DAY 90 tablet 2  . metoprolol tartrate (LOPRESSOR) 25 MG tablet Take 25 mg 1 tablet in am and 12.5 mg 1/2 tablet in pm 180 tablet 3  . pantoprazole (PROTONIX) 40 MG tablet Take 40 mg by mouth daily as needed for heartburn.  4  . simvastatin (ZOCOR) 40 MG tablet Take 40 mg by mouth daily.     No current facility-administered medications for this visit.    Allergies:   Benicar [olmesartan], Codeine, Fosamax [alendronate sodium], Lisinopril, and Norvasc [amlodipine]    Social History:  The patient  reports that she quit smoking about 28 years ago. She quit after 20.00 years of use. She has never used smokeless tobacco. She reports current  alcohol use. She reports that she does not use drugs.   Family History:  The patient's family history includes Breast cancer in her mother; CVA in her brother and brother; Crohn's disease in her brother; Diabetes in her brother; Hip fracture in her mother; Other in her brother; Renal cancer in her brother; Ulcers in her father.    ROS:  Please see the history of present illness.   Otherwise, review of systems are positive for none.   All other systems are reviewed and negative.    PHYSICAL EXAM: VS:  BP (!) 154/80   Pulse (!) 52   Ht 5\' 5"  (1.651 m)   Wt 165 lb (74.8 kg)   BMI 27.46 kg/m  , BMI Body mass index is 27.46 kg/m. GENERAL:  Well appearing WF in NAD HEENT:  PERRL, EOMI, sclera are clear. Oropharynx is clear. NECK:   No jugular venous distention, carotid upstroke brisk and symmetric, no bruits, no thyromegaly or adenopathy LUNGS:  Clear to auscultation bilaterally CHEST:  Unremarkable HEART:  RRR,  PMI not displaced or sustained,S1 and S2 within normal limits, no S3, no S4: no clicks, no rubs, no murmurs ABD:  Soft, nontender. BS +, no masses or bruits. No hepatomegaly, no splenomegaly EXT:  2 + pulses throughout, no edema, no cyanosis no clubbing SKIN:  Warm and dry.  No rashes NEURO:  Alert and oriented x 3. Cranial nerves II through XII intact. PSYCH:  Cognitively intact   Recent Labs: No results found for requested labs within last 8760 hours.    Lipid Panel No results found for: CHOL, TRIG, HDL, CHOLHDL, VLDL, LDLCALC, LDLDIRECT    Wt Readings from Last 3 Encounters:  06/01/20 165 lb (74.8 kg)  12/03/19 168 lb (76.2 kg)  07/10/19 170 lb (77.1 kg)      Other studies Reviewed: Additional studies/ records that were reviewed today include:  Labs dated 05/29/17: cholesterol 163, triglycerides 74, HDL 63, LDL 85. LFTs and TSH normal Dated 05/29/18: cholesterol 129, triglycerides 82, HDL 55, LDL 58. Normal CBC, Chemistries, TSH. BNP 143.  Dated 07/22/18: Normal CBC and BMET Dated 05/29/19: A1c 6.3%. cholesterol 170, triglycerides 115, HDL 58, LDL 90. CMET, CBC nad TSH normal  Ecg today shows sinus brady 52 bpm, LBBB. I have personally reviewed and interpreted this study.   Myoview 10/23/17: Study Highlights    Nuclear stress EF: 51%.  The left ventricular ejection fraction is mildly decreased (45-54%).  There was no ST segment deviation noted during stress.  Defect 1: There is a small defect of moderate severity present in the mid anterior location. This is due to breast attenuation artifact.  This is a low risk study.  Septal wall hypokinesis consistent with left bundle branch block.    Echo 06/09/18: Study Conclusions  - Left ventricle: The cavity size was normal. Systolic function  was   mildly to moderately reduced. The estimated ejection fraction was   in the range of 40% to 45%. Diffuse hypokinesis. Regional wall   motion abnormalities cannot be excluded. The study is not   technically sufficient to allow evaluation of LV diastolic   function. - Ventricular septum: Septal motion showed dyssynergy. These   changes are consistent with a left bundle branch block. - Mitral valve: Calcified annulus. There was trivial regurgitation. - Left atrium: The atrium was moderately dilated. - Right ventricle: Systolic function was mildly reduced. - Atrial septum: No defect or patent foramen ovale was identified. - Tricuspid valve: There was moderate  regurgitation. - Pulmonic valve: There was mild regurgitation. - Pulmonary arteries: Systolic pressure was mildly increased.  Impressions:  - Technically difficult study, echo contrast used to opacify LV.     Appears to have diffuse mild-moderate hypokinesis, worst at the   apex and anterior/anteroseptal regions but seen across multiple   coronary distributions. Also has dyssynchrony due to LBBB. Given   distribution, favor LBBB-related but cannot completely excluded   CAD as cause.   ASSESSMENT AND PLAN:  1. Atrial fibrillation s/p DCCV on July 30, 2018. On metoprolol. Maintaining NSR. [place] Vasc score of 3. On Eliquis at appropriate dose.  Echo showed mild LV dysfunction with dyssynergy due to LBBB. Moderate LAE.    2. HTN.  well controlled.  3. Hypercholesterolemia. On statin- excellent control. Due for follow up labs with Dr Rex Kras.   4. Chronic LBBB.- normal Myoview in January 2019.   Follow up one year    Signed, Jeevan Kalla [name], MD , [facility] 06/01/2020 8:59 AM    [address], [place], [place], [address] Phone [phone], Fax [phone]

## 2020-06-01 ENCOUNTER — Encounter: Payer: Self-pay | Admitting: Cardiology

## 2020-06-01 ENCOUNTER — Other Ambulatory Visit: Payer: Self-pay

## 2020-06-01 ENCOUNTER — Ambulatory Visit (INDEPENDENT_AMBULATORY_CARE_PROVIDER_SITE_OTHER): Payer: Medicare Other | Admitting: Cardiology

## 2020-06-01 VITALS — BP 154/80 | HR 52 | Ht 65.0 in | Wt 165.0 lb

## 2020-06-01 DIAGNOSIS — I10 Essential (primary) hypertension: Secondary | ICD-10-CM | POA: Diagnosis not present

## 2020-06-01 DIAGNOSIS — I480 Paroxysmal atrial fibrillation: Secondary | ICD-10-CM

## 2020-06-01 DIAGNOSIS — E7800 Pure hypercholesterolemia, unspecified: Secondary | ICD-10-CM

## 2020-06-01 DIAGNOSIS — I447 Left bundle-branch block, unspecified: Secondary | ICD-10-CM | POA: Diagnosis not present

## 2020-06-03 DIAGNOSIS — K219 Gastro-esophageal reflux disease without esophagitis: Secondary | ICD-10-CM | POA: Diagnosis not present

## 2020-06-03 DIAGNOSIS — Z87442 Personal history of urinary calculi: Secondary | ICD-10-CM | POA: Diagnosis not present

## 2020-06-03 DIAGNOSIS — E782 Mixed hyperlipidemia: Secondary | ICD-10-CM | POA: Diagnosis not present

## 2020-06-03 DIAGNOSIS — Z853 Personal history of malignant neoplasm of breast: Secondary | ICD-10-CM | POA: Diagnosis not present

## 2020-06-03 DIAGNOSIS — R7301 Impaired fasting glucose: Secondary | ICD-10-CM | POA: Diagnosis not present

## 2020-06-03 DIAGNOSIS — M810 Age-related osteoporosis without current pathological fracture: Secondary | ICD-10-CM | POA: Diagnosis not present

## 2020-06-03 DIAGNOSIS — I4891 Unspecified atrial fibrillation: Secondary | ICD-10-CM | POA: Diagnosis not present

## 2020-06-03 DIAGNOSIS — I10 Essential (primary) hypertension: Secondary | ICD-10-CM | POA: Diagnosis not present

## 2020-06-03 DIAGNOSIS — Z23 Encounter for immunization: Secondary | ICD-10-CM | POA: Diagnosis not present

## 2020-06-08 ENCOUNTER — Telehealth: Payer: Self-pay | Admitting: Cardiology

## 2020-06-08 NOTE — Telephone Encounter (Signed)
Patient is requesting to speak with Lisa [name]. She states her PCP, Dr. Rex Kras, expressed concern with her taking 2 diuretics- per patient, Dr. Rex Kras was to contact Dr. [name] to discuss altering patient's medications. Patient is calling to follow up, to discuss whether or not any medication changes have been made.

## 2020-06-08 NOTE — Telephone Encounter (Signed)
Pt called to report that she recently saw Dr. Rex Kras her PCP and she reported to him that she has occasional light headedness and is up all night urinating. She does drink enough fluids during the day. He was concerned that the Lasix and HCTZ was too much for her. He did bloodwork on her and she is waiting for the results.   I advised her that if Dr. Rex Kras wanted to make any med changes based on her labwork and BP she just needs to let us know the changes and we can update her med list... that it is very appropriate for her PCP to make these changes. Pt agreed and thanked me for the call.

## 2020-06-28 ENCOUNTER — Other Ambulatory Visit: Payer: Self-pay

## 2020-06-28 ENCOUNTER — Ambulatory Visit
Admission: RE | Admit: 2020-06-28 | Discharge: 2020-06-28 | Disposition: A | Discharge status: Home / Self Care | Payer: Medicare Other | Source: Ambulatory Visit | Attending: Family Medicine | Admitting: Family Medicine

## 2020-06-28 DIAGNOSIS — Z1231 Encounter for screening mammogram for malignant neoplasm of breast: Secondary | ICD-10-CM | POA: Diagnosis not present

## 2020-06-28 DIAGNOSIS — Z0000 Encounter for general adult medical examination without abnormal findings: Secondary | ICD-10-CM

## 2020-06-29 DIAGNOSIS — N289 Disorder of kidney and ureter, unspecified: Secondary | ICD-10-CM | POA: Diagnosis not present

## 2020-06-29 DIAGNOSIS — E1169 Type 2 diabetes mellitus with other specified complication: Secondary | ICD-10-CM | POA: Diagnosis not present

## 2020-07-06 DIAGNOSIS — Z23 Encounter for immunization: Secondary | ICD-10-CM | POA: Diagnosis not present

## 2020-07-15 ENCOUNTER — Other Ambulatory Visit: Payer: Self-pay | Admitting: Cardiology

## 2020-08-22 DIAGNOSIS — H25813 Combined forms of age-related cataract, bilateral: Secondary | ICD-10-CM | POA: Diagnosis not present

## 2020-09-12 ENCOUNTER — Telehealth: Payer: Self-pay | Admitting: Cardiology

## 2020-09-12 NOTE — Telephone Encounter (Signed)
° ° ° °  Pt c/o medication issue:  1. Name of Medication: antibiotics  2. How are you currently taking this medication (dosage and times per day)?   3. Are you having a reaction (difficulty breathing--STAT)?    4. What is your medication issue? Pt said she is having soar throat but she doesn't know what she can take with her heart medications. She would like to ask Dr. [name] if he can prescribe her antibiotics

## 2020-09-12 NOTE — Telephone Encounter (Signed)
Returned call to pt, informed pt that she should call PCP to discuss antibiotic rx she will CB and see what PCP rx's

## 2020-09-15 DIAGNOSIS — R059 Cough, unspecified: Secondary | ICD-10-CM | POA: Diagnosis not present

## 2020-09-15 DIAGNOSIS — J029 Acute pharyngitis, unspecified: Secondary | ICD-10-CM | POA: Diagnosis not present

## 2020-09-15 DIAGNOSIS — Z20822 Contact with and (suspected) exposure to covid-19: Secondary | ICD-10-CM | POA: Diagnosis not present

## 2020-09-30 DIAGNOSIS — J029 Acute pharyngitis, unspecified: Secondary | ICD-10-CM | POA: Diagnosis not present

## 2020-09-30 DIAGNOSIS — R059 Cough, unspecified: Secondary | ICD-10-CM | POA: Diagnosis not present

## 2020-09-30 DIAGNOSIS — E1169 Type 2 diabetes mellitus with other specified complication: Secondary | ICD-10-CM | POA: Diagnosis not present

## 2020-09-30 DIAGNOSIS — N289 Disorder of kidney and ureter, unspecified: Secondary | ICD-10-CM | POA: Diagnosis not present

## 2020-12-08 DIAGNOSIS — H25813 Combined forms of age-related cataract, bilateral: Secondary | ICD-10-CM | POA: Diagnosis not present

## 2020-12-22 ENCOUNTER — Other Ambulatory Visit: Payer: Self-pay | Admitting: Cardiology

## 2020-12-22 NOTE — Telephone Encounter (Signed)
5f, 74.8kg, scr 1.15(06/03/20), lovw/jordan(06/01/20)

## 2021-01-06 ENCOUNTER — Other Ambulatory Visit: Payer: Self-pay | Admitting: Cardiology

## 2021-02-22 DIAGNOSIS — H52202 Unspecified astigmatism, left eye: Secondary | ICD-10-CM | POA: Diagnosis not present

## 2021-02-22 DIAGNOSIS — H25812 Combined forms of age-related cataract, left eye: Secondary | ICD-10-CM | POA: Diagnosis not present

## 2021-02-22 DIAGNOSIS — I10 Essential (primary) hypertension: Secondary | ICD-10-CM | POA: Diagnosis not present

## 2021-03-22 DIAGNOSIS — H52201 Unspecified astigmatism, right eye: Secondary | ICD-10-CM | POA: Diagnosis not present

## 2021-03-22 DIAGNOSIS — I10 Essential (primary) hypertension: Secondary | ICD-10-CM | POA: Diagnosis not present

## 2021-03-22 DIAGNOSIS — H25811 Combined forms of age-related cataract, right eye: Secondary | ICD-10-CM | POA: Diagnosis not present

## 2021-05-12 ENCOUNTER — Other Ambulatory Visit: Payer: Self-pay

## 2021-05-12 ENCOUNTER — Encounter: Payer: Self-pay | Admitting: Plastic Surgery

## 2021-05-12 ENCOUNTER — Ambulatory Visit (INDEPENDENT_AMBULATORY_CARE_PROVIDER_SITE_OTHER): Payer: Medicare Other | Admitting: Plastic Surgery

## 2021-05-12 DIAGNOSIS — L989 Disorder of the skin and subcutaneous tissue, unspecified: Secondary | ICD-10-CM

## 2021-05-12 NOTE — Progress Notes (Signed)
   Subjective:    Patient ID: Lisa [name], female    DOB: Jun 04, 1941, 80 y.o.   MRN: [id number]  The patient is a lovely 80 year old female here with her husband for evaluation of her face.  She has some areas of concern.  She had some fluorouracil that I had given her previously so she started using that on her nose.  She said it got really irritated and then seem to clear up.  She did it for 6 weeks.  It may have been an actinic keratosis.  It is difficult to know because now it looks good.  No other concerning areas were noted.     Review of Systems  Constitutional: Negative.   HENT: Negative.    Eyes: Negative.   Respiratory: Negative.    Cardiovascular: Negative.   Gastrointestinal: Negative.   Genitourinary: Negative.   Skin:  Positive for color change.  Psychiatric/Behavioral: Negative.        Objective:   Physical Exam Vitals and nursing note reviewed.  Constitutional:      Appearance: Normal appearance.  HENT:     Head: Normocephalic.  Cardiovascular:     Rate and Rhythm: Normal rate.     Pulses: Normal pulses.  Pulmonary:     Effort: Pulmonary effort is normal.  Skin:    Capillary Refill: Capillary refill takes less than 2 seconds.     Coloration: Skin is not jaundiced.     Findings: Erythema present. No bruising.  Neurological:     Mental Status: She is alert and oriented to person, place, and time.       Assessment & Plan:     ICD-10-CM   1. Changing skin lesion  L98.9        I would like the patient to see dermatology to get a baseline review.  She is in agreement.  We will make a referral today.  I will plan to see her back in 6 months.   Pictures were obtained of the patient and placed in the chart with the patient's or guardian's permission.

## 2021-05-24 ENCOUNTER — Other Ambulatory Visit: Payer: Self-pay | Admitting: Family Medicine

## 2021-05-24 DIAGNOSIS — Z1231 Encounter for screening mammogram for malignant neoplasm of breast: Secondary | ICD-10-CM

## 2021-05-25 ENCOUNTER — Other Ambulatory Visit: Payer: Self-pay | Admitting: Cardiology

## 2021-05-26 NOTE — Telephone Encounter (Signed)
Prescription refill request for Eliquis received. Indication:afib Last office visit:jordan 06/01/20 Scr:1.15 06/03/20 Age: 36fWeight:74.8kg

## 2021-06-12 DIAGNOSIS — B078 Other viral warts: Secondary | ICD-10-CM | POA: Diagnosis not present

## 2021-06-12 DIAGNOSIS — Z85828 Personal history of other malignant neoplasm of skin: Secondary | ICD-10-CM | POA: Diagnosis not present

## 2021-06-12 DIAGNOSIS — L578 Other skin changes due to chronic exposure to nonionizing radiation: Secondary | ICD-10-CM | POA: Diagnosis not present

## 2021-06-12 DIAGNOSIS — L814 Other melanin hyperpigmentation: Secondary | ICD-10-CM | POA: Diagnosis not present

## 2021-06-12 DIAGNOSIS — D225 Melanocytic nevi of trunk: Secondary | ICD-10-CM | POA: Diagnosis not present

## 2021-06-12 DIAGNOSIS — L570 Actinic keratosis: Secondary | ICD-10-CM | POA: Diagnosis not present

## 2021-06-12 DIAGNOSIS — L821 Other seborrheic keratosis: Secondary | ICD-10-CM | POA: Diagnosis not present

## 2021-06-14 DIAGNOSIS — Z23 Encounter for immunization: Secondary | ICD-10-CM | POA: Diagnosis not present

## 2021-06-14 DIAGNOSIS — M810 Age-related osteoporosis without current pathological fracture: Secondary | ICD-10-CM | POA: Diagnosis not present

## 2021-06-14 DIAGNOSIS — D6869 Other thrombophilia: Secondary | ICD-10-CM | POA: Diagnosis not present

## 2021-06-14 DIAGNOSIS — I4891 Unspecified atrial fibrillation: Secondary | ICD-10-CM | POA: Diagnosis not present

## 2021-06-14 DIAGNOSIS — E782 Mixed hyperlipidemia: Secondary | ICD-10-CM | POA: Diagnosis not present

## 2021-06-14 DIAGNOSIS — Z0000 Encounter for general adult medical examination without abnormal findings: Secondary | ICD-10-CM | POA: Diagnosis not present

## 2021-06-14 DIAGNOSIS — N1831 Chronic kidney disease, stage 3a: Secondary | ICD-10-CM | POA: Diagnosis not present

## 2021-06-14 DIAGNOSIS — E1169 Type 2 diabetes mellitus with other specified complication: Secondary | ICD-10-CM | POA: Diagnosis not present

## 2021-06-14 DIAGNOSIS — E559 Vitamin D deficiency, unspecified: Secondary | ICD-10-CM | POA: Diagnosis not present

## 2021-06-14 DIAGNOSIS — K219 Gastro-esophageal reflux disease without esophagitis: Secondary | ICD-10-CM | POA: Diagnosis not present

## 2021-06-14 DIAGNOSIS — I10 Essential (primary) hypertension: Secondary | ICD-10-CM | POA: Diagnosis not present

## 2021-06-15 DIAGNOSIS — E559 Vitamin D deficiency, unspecified: Secondary | ICD-10-CM | POA: Diagnosis not present

## 2021-06-15 DIAGNOSIS — I4891 Unspecified atrial fibrillation: Secondary | ICD-10-CM | POA: Diagnosis not present

## 2021-06-15 DIAGNOSIS — M810 Age-related osteoporosis without current pathological fracture: Secondary | ICD-10-CM | POA: Diagnosis not present

## 2021-06-15 DIAGNOSIS — I10 Essential (primary) hypertension: Secondary | ICD-10-CM | POA: Diagnosis not present

## 2021-06-15 DIAGNOSIS — E1169 Type 2 diabetes mellitus with other specified complication: Secondary | ICD-10-CM | POA: Diagnosis not present

## 2021-06-15 DIAGNOSIS — N1831 Chronic kidney disease, stage 3a: Secondary | ICD-10-CM | POA: Diagnosis not present

## 2021-06-16 NOTE — Progress Notes (Signed)
Cardiology Office Note   Date:  06/19/2021   ID:  Lisa [name], DOB 09-17-1941, MRN [id number]  PCP:  Kristen Loader, FNP  Cardiologist:   Drayk Humbarger [name], MD   Chief Complaint  Patient presents with   Atrial Fibrillation       History of Present Illness: Lisa [name] is a 80 y.o. female who is seen for follow up  Afib.  She has a history of HTN, HLD, and LBBB. She was seen November 2018 with chest pain. Troponins were negative. She was hypertensive. Started on anti reflux therapy with improvement in symptoms. Subsequent Myoview in January 2019 was normal. Prior  Echo and Adenosine Myoview in 2006 were normal.   In August 2019 she noted sudden onset of SOB. Noted some swelling. Did not really feel palpitations. She initially had bad indigestion but this resolved. She was seen by Dr. Rex Kras  and found to be in AFib. Rate 97. Treated with  Eliquis, metoprolol and lasix. On 07/30/18 she underwent successful DCCV.   On follow up today she is doing well. No chest pain. No significant dyspnea or palpitations. In general BP in the 756 systolic range. Will go up in the afternoon at times.  Does yard work without difficulty.     Past Medical History:  Diagnosis Date   Bundle branch block, left    Dyslipidemia    GERD (gastroesophageal reflux disease)    Hypercholesteremia    Hypertension    LBBB (left bundle branch block)    UTI (urinary tract infection)     Past Surgical History:  Procedure Laterality Date   bladder polyp removal     BREAST EXCISIONAL BIOPSY Right    60 yrs ago- Benign   BREAST LUMPECTOMY Left    No Visible Scar, said 30 + years ago in situ, no chemo, no radiation, or hormone replacement     CARDIOVERSION N/A 07/30/2018   Procedure: CARDIOVERSION;  Surgeon: Jerline Pain, MD;  Location: [facility];  Service: Cardiovascular;  Laterality: N/A;   CESAREAN SECTION       Current Outpatient Medications  Medication Sig Dispense Refill   calcium carbonate  (TUMS - DOSED IN MG ELEMENTAL CALCIUM) 500 MG chewable tablet Chew 1 tablet by mouth daily as needed for indigestion or heartburn.     ELIQUIS 5 MG TABS tablet TAKE 1 TABLET BY MOUTH TWICE A DAY 180 tablet 1   Ergocalciferol (VITAMIN D2) 2000 units TABS Take 2,000 Units by mouth daily.     hydrochlorothiazide (HYDRODIURIL) 25 MG tablet TAKE 1 TABLET BY MOUTH EVERY DAY 90 tablet 3   losartan (COZAAR) 100 MG tablet TAKE 1 TABLET BY MOUTH EVERY DAY 90 tablet 2   metoprolol tartrate (LOPRESSOR) 25 MG tablet Take 25 mg 1 tablet in am and 12.5 mg 1/2 tablet in pm 180 tablet 3   pantoprazole (PROTONIX) 40 MG tablet Take 40 mg by mouth daily as needed for heartburn.  4   simvastatin (ZOCOR) 40 MG tablet Take 40 mg by mouth daily.     No current facility-administered medications for this visit.    Allergies:   Benicar [olmesartan], Codeine, Fosamax [alendronate sodium], Lisinopril, and Norvasc [amlodipine]    Social History:  The patient  reports that she quit smoking about 29 years ago. Her smoking use included cigarettes. She has never used smokeless tobacco. She reports current alcohol use. She reports that she does not use drugs.   Family History:  The patient's  family history includes Breast cancer in her mother; CVA in her brother and brother; Crohn's disease in her brother; Diabetes in her brother; Hip fracture in her mother; Other in her brother; Renal cancer in her brother; Ulcers in her father.    ROS:  Please see the history of present illness.   Otherwise, review of systems are positive for none.   All other systems are reviewed and negative.    PHYSICAL EXAM: VS:  BP (!) 158/80 (BP Location: Right Arm, Patient Position: Sitting, Cuff Size: Normal)   Pulse (!) 56   Ht 5\' 5"  (1.651 m)   Wt 165 lb 12.8 oz (75.2 kg)   SpO2 97%   BMI 27.59 kg/m  , BMI Body mass index is 27.59 kg/m. GENERAL:  Well appearing WF in NAD HEENT:  PERRL, EOMI, sclera are clear. Oropharynx is clear. NECK:   No jugular venous distention, carotid upstroke brisk and symmetric, no bruits, no thyromegaly or adenopathy LUNGS:  Clear to auscultation bilaterally CHEST:  Unremarkable HEART:  RRR,  PMI not displaced or sustained,S1 and S2 within normal limits, no S3, no S4: no clicks, no rubs, no murmurs ABD:  Soft, nontender. BS +, no masses or bruits. No hepatomegaly, no splenomegaly EXT:  2 + pulses throughout, no edema, no cyanosis no clubbing SKIN:  Warm and dry.  No rashes NEURO:  Alert and oriented x 3. Cranial nerves II through XII intact. PSYCH:  Cognitively intact   Recent Labs: No results found for requested labs within last 8760 hours.    Lipid Panel No results found for: CHOL, TRIG, HDL, CHOLHDL, VLDL, LDLCALC, LDLDIRECT    Wt Readings from Last 3 Encounters:  06/19/21 165 lb 12.8 oz (75.2 kg)  05/12/21 165 lb (74.8 kg)  06/01/20 165 lb (74.8 kg)      Other studies Reviewed: Additional studies/ records that were reviewed today include:  Labs dated 05/29/17: cholesterol 163, triglycerides 74, HDL 63, LDL 85. LFTs and TSH normal Dated 05/29/18: cholesterol 129, triglycerides 82, HDL 55, LDL 58. Normal CBC, Chemistries, TSH. BNP 143.  Dated 07/22/18: Normal CBC and BMET Dated 05/29/19: A1c 6.3%. cholesterol 170, triglycerides 115, HDL 58, LDL 90. CMET, CBC nad TSH normal Dated 06/14/20:  cholesterol 159, triglycerides 94, HDL 59, LDL 83.  Dated 06/15/21: A1c 6.3%. CMET, CBC, and TSH normal   Ecg today shows sinus brady 56 bpm, LBBB. I have personally reviewed and interpreted this study.   Myoview 10/23/17: Study Highlights   Nuclear stress EF: 51%. The left ventricular ejection fraction is mildly decreased (45-54%). There was no ST segment deviation noted during stress. Defect 1: There is a small defect of moderate severity present in the mid anterior location. This is due to breast attenuation artifact. This is a low risk study. Septal wall hypokinesis consistent with left bundle  branch block.    Echo 06/09/18: Study Conclusions   - Left ventricle: The cavity size was normal. Systolic function was   mildly to moderately reduced. The estimated ejection fraction was   in the range of 40% to 45%. Diffuse hypokinesis. Regional wall   motion abnormalities cannot be excluded. The study is not   technically sufficient to allow evaluation of LV diastolic   function. - Ventricular septum: Septal motion showed dyssynergy. These   changes are consistent with a left bundle branch block. - Mitral valve: Calcified annulus. There was trivial regurgitation. - Left atrium: The atrium was moderately dilated. - Right ventricle: Systolic function was mildly  reduced. - Atrial septum: No defect or patent foramen ovale was identified. - Tricuspid valve: There was moderate regurgitation. - Pulmonic valve: There was mild regurgitation. - Pulmonary arteries: Systolic pressure was mildly increased.   Impressions:   - Technically difficult study, echo contrast used to opacify LV.     Appears to have diffuse mild-moderate hypokinesis, worst at the   apex and anterior/anteroseptal regions but seen across multiple   coronary distributions. Also has dyssynchrony due to LBBB. Given   distribution, favor LBBB-related but cannot completely excluded   CAD as cause.   ASSESSMENT AND PLAN:  1. Atrial fibrillation s/p DCCV on July 30, 2018. On metoprolol. Maintaining NSR. [place] Vasc score of 3. On Eliquis at appropriate dose.  Echo showed mild LV dysfunction with dyssynergy due to LBBB. Moderate LAE.    2. HTN.  well controlled. Will monitor and let me know if higher than 494 systolic.   3. Hypercholesterolemia. On statin. Labs followed by primary care.  4. Chronic LBBB.- normal Myoview in January 2019.   Follow up one year    Signed, Jansen Goodpasture [name], MD , [facility] 06/19/2021 9:21 AM    [facility] [address] [place], [place], [address] Phone [phone], Fax  [phone]

## 2021-06-19 ENCOUNTER — Ambulatory Visit (INDEPENDENT_AMBULATORY_CARE_PROVIDER_SITE_OTHER): Payer: Medicare Other | Admitting: Cardiology

## 2021-06-19 ENCOUNTER — Other Ambulatory Visit: Payer: Self-pay

## 2021-06-19 ENCOUNTER — Encounter: Payer: Self-pay | Admitting: Cardiology

## 2021-06-19 VITALS — BP 158/80 | HR 56 | Ht 65.0 in | Wt 165.8 lb

## 2021-06-19 DIAGNOSIS — I10 Essential (primary) hypertension: Secondary | ICD-10-CM | POA: Diagnosis not present

## 2021-06-19 DIAGNOSIS — I480 Paroxysmal atrial fibrillation: Secondary | ICD-10-CM

## 2021-06-19 DIAGNOSIS — E7800 Pure hypercholesterolemia, unspecified: Secondary | ICD-10-CM | POA: Diagnosis not present

## 2021-06-22 ENCOUNTER — Other Ambulatory Visit: Payer: Self-pay | Admitting: Cardiology

## 2021-07-05 ENCOUNTER — Ambulatory Visit: Payer: Medicare Other

## 2021-07-23 DIAGNOSIS — E7800 Pure hypercholesterolemia, unspecified: Secondary | ICD-10-CM | POA: Diagnosis not present

## 2021-07-23 DIAGNOSIS — Z79899 Other long term (current) drug therapy: Secondary | ICD-10-CM | POA: Diagnosis not present

## 2021-07-23 DIAGNOSIS — I4891 Unspecified atrial fibrillation: Secondary | ICD-10-CM | POA: Diagnosis not present

## 2021-07-23 DIAGNOSIS — I517 Cardiomegaly: Secondary | ICD-10-CM | POA: Diagnosis not present

## 2021-07-23 DIAGNOSIS — Z7901 Long term (current) use of anticoagulants: Secondary | ICD-10-CM | POA: Diagnosis not present

## 2021-07-23 DIAGNOSIS — M25511 Pain in right shoulder: Secondary | ICD-10-CM | POA: Diagnosis not present

## 2021-07-23 DIAGNOSIS — I10 Essential (primary) hypertension: Secondary | ICD-10-CM | POA: Diagnosis not present

## 2021-07-23 DIAGNOSIS — R110 Nausea: Secondary | ICD-10-CM | POA: Diagnosis not present

## 2021-07-23 DIAGNOSIS — R001 Bradycardia, unspecified: Secondary | ICD-10-CM | POA: Diagnosis not present

## 2021-07-23 DIAGNOSIS — R112 Nausea with vomiting, unspecified: Secondary | ICD-10-CM | POA: Diagnosis not present

## 2021-07-24 ENCOUNTER — Telehealth: Payer: Self-pay | Admitting: Cardiology

## 2021-07-24 DIAGNOSIS — R112 Nausea with vomiting, unspecified: Secondary | ICD-10-CM | POA: Diagnosis not present

## 2021-07-24 DIAGNOSIS — R110 Nausea: Secondary | ICD-10-CM | POA: Diagnosis not present

## 2021-07-24 DIAGNOSIS — R001 Bradycardia, unspecified: Secondary | ICD-10-CM | POA: Diagnosis not present

## 2021-07-24 DIAGNOSIS — M25511 Pain in right shoulder: Secondary | ICD-10-CM | POA: Diagnosis not present

## 2021-07-24 DIAGNOSIS — I517 Cardiomegaly: Secondary | ICD-10-CM | POA: Diagnosis not present

## 2021-07-24 NOTE — Telephone Encounter (Signed)
Spoke to patient she stated she went to ED at [facility] in [place] last night with pain in right shoulder blade.Stated all lab and ekgs normal.Stated ED Dr. wanted to refer her to one of their cardiologist.Stated she told them she would see Dr.Jordan.Advised Dr.Jordan is out of office this week.Appointment scheduled with Caron Presume PA 11/2 at 11:45 am.I will make Dr.Jordan aware.

## 2021-07-24 NOTE — Telephone Encounter (Signed)
Patient said she went to the [facility] ED last night then got a call this morning to schedule a follow- up visit with [facility].  The patient wanted to check with Dr. [name] for advice before scheduling an appointment with another cardiologist. Please advise

## 2021-07-25 NOTE — Telephone Encounter (Signed)
Agree  Nikea Settle MD, FACC   

## 2021-07-25 NOTE — Progress Notes (Signed)
Cardiology Office Note:    Date:  07/26/2021   ID:  Lisa [name], DOB 12-Dec-1940, MRN [id number]  PCP:  Kristen Loader, [place] Cardiologist: Peter [name], MD   Reason for visit: Follow-up ED visit for right shoulder pain  History of Present Illness:    Lisa [name] is a 80 y.o. female with a hx of atrial fibrillation s/p DCCV in 07/2018 (sx SOB, swelling, indigestion), hypertension, hyperlipidemia, left bundle branch block.   Myoview in January 2019 was normal.  She last saw Dr. [name] on June 19, 2021 and was doing well.  Recommended f/u in 1 year.  She went to [facility] and complained of sudden right shoulder pain along with nausea and one episode of vomiting.  She took aspirin and her symptoms improved.  Troponin unremarkable.  Chest x-ray with mild enlargement of the cardiomediastinal silhouette and borderline pulmonary venous congestion.  Today, she and her husband tell me about the episode of right shoulder pain.  They mentioned that the patient tripped over steps and fell on her right arm a couple days before the pain began.  They were concerned about the episode of nausea/vomiting and wanted to make sure that is not heart related.  Her husband mentions that she does get nauseous with significant pain.  She took 2 Bayer aspirin and the pain resolved within an hour.  She denies episodes of chest pressure or tightness.  She does have some shortness of breath on the top of stairs that is unchanged.  Her husband says she is very active and still mows the lawn.  She denies heart failure symptoms including PND, orthopnea and lower extremity edema.  No weight changes.  They show me her blood pressure log where her systolic blood pressure ranges from 120s to 150s.  They mentioned that they used to take metoprolol tartrate 25 twice daily but they reduced it to half tablet at night because of lower blood pressure.       Past Medical  History:  Diagnosis Date   Bundle branch block, left    Dyslipidemia    GERD (gastroesophageal reflux disease)    Hypercholesteremia    Hypertension    LBBB (left bundle branch block)    UTI (urinary tract infection)     Past Surgical History:  Procedure Laterality Date   bladder polyp removal     BREAST EXCISIONAL BIOPSY Right    60 yrs ago- Benign   BREAST LUMPECTOMY Left    No Visible Scar, said 30 + years ago in situ, no chemo, no radiation, or hormone replacement     CARDIOVERSION N/A 07/30/2018   Procedure: CARDIOVERSION;  Surgeon: Jerline Pain, MD;  Location: [facility];  Service: Cardiovascular;  Laterality: N/A;   CESAREAN SECTION      Current Medications: Current Meds  Medication Sig   calcium carbonate (TUMS - DOSED IN MG ELEMENTAL CALCIUM) 500 MG chewable tablet Chew 1 tablet by mouth daily as needed for indigestion or heartburn.   Ergocalciferol (VITAMIN D2) 2000 units TABS Take 2,000 Units by mouth daily.   hydrochlorothiazide (HYDRODIURIL) 25 MG tablet TAKE 1 TABLET BY MOUTH EVERY DAY   losartan (COZAAR) 100 MG tablet TAKE 1 TABLET BY MOUTH EVERY DAY   metoprolol tartrate (LOPRESSOR) 25 MG tablet Take 25 mg 1 tablet in am and 12.5 mg 1/2 tablet in pm   pantoprazole (PROTONIX) 40 MG tablet Take 40 mg by mouth daily as needed for  heartburn.   simvastatin (ZOCOR) 40 MG tablet Take 40 mg by mouth daily.   [DISCONTINUED] ELIQUIS 5 MG TABS tablet TAKE 1 TABLET BY MOUTH TWICE A DAY     Allergies:   Benicar [olmesartan], Codeine, Fosamax [alendronate sodium], Lisinopril, and Norvasc [amlodipine]   Social History   Socioeconomic History   Marital status: Married    Spouse name: Not on file   Number of children: 2   Years of education: Not on file   Highest education level: Not on file  Occupational History   Not on file  Tobacco Use   Smoking status: Former    Years: 20.00    Types: Cigarettes    Quit date: 1993    Years since quitting: 29.8   Smokeless  tobacco: Never  Vaping Use   Vaping Use: Never used  Substance and Sexual Activity   Alcohol use: Yes   Drug use: No   Sexual activity: Not on file  Other Topics Concern   Not on file  Social History Narrative   Not on file   Social Determinants of Health   Financial Resource Strain: Not on file  Food Insecurity: Not on file  Transportation Needs: Not on file  Physical Activity: Not on file  Stress: Not on file  Social Connections: Not on file     Family History: The patient's family history includes Breast cancer in her mother; CVA in her brother and brother; Crohn's disease in her brother; Diabetes in her brother; Hip fracture in her mother; Other in her brother; Renal cancer in her brother; Ulcers in her father.  ROS:   Please see the history of present illness.     EKGs/Labs/Other Studies Reviewed:    EKG:  The ekg ordered today demonstrates minus bradycardia, left axis deviation, left bundle branch block, heart rate 56, PR interval 140 ms, QRS duration of 80 ms.  Recent Labs: No results found for requested labs within last 8760 hours.   Recent Lipid Panel No results found for: CHOL, TRIG, HDL, LDLCALC, LDLDIRECT  Physical Exam:    VS:  BP (!) 152/78   Pulse (!) 56   Ht 5\' 5"  (1.651 m)   Wt 167 lb 3.2 oz (75.8 kg)   SpO2 96%   BMI 27.82 kg/m    No data found.  Wt Readings from Last 3 Encounters:  07/26/21 167 lb 3.2 oz (75.8 kg)  06/19/21 165 lb 12.8 oz (75.2 kg)  05/12/21 165 lb (74.8 kg)     GEN:  Well nourished, well developed in no acute distress HEENT: Normal NECK: No JVD; No carotid bruits CARDIAC: RRR, no murmurs, rubs, gallops RESPIRATORY:  Clear to auscultation without rales, wheezing or rhonchi  ABDOMEN: Soft, non-tender, non-distended MUSCULOSKELETAL: No edema; No deformity  SKIN: Warm and dry NEUROLOGIC:  Alert and oriented PSYCHIATRIC:  Normal affect     ASSESSMENT AND PLAN   Right shoulder pain 2/2 fall -Sudden onset, associated  with nausea and vomiting. -Occurred post fall.  Tender upon palpation and worse with position changes. -Not concerned for ischemia.  No further work-up recommended.  LBBB -Echo 2019 with EF 40 to 45%, diffuse hypokinesis, dyssynchrony secondary to left bundle branch block, mildly dilated left atria, moderate tricuspid regurg, mild pulmonic regurgitation -No CHF symptoms.  Recommend better blood pressure control.  Given dietary tips on how to lower blood pressure.  If systolic blood pressure stays over 130 at follow-up, recommend switching metoprolol tartrate to carvedilol.  Paroxysmal atrial fibrillation -  Status post DCCV in November 2019 -Echo showed mild LV dysfunction with dyssynergy due to LBBB. Moderate LAE.   -Continue Eliquis for stroke prevention and metoprolol for rate control.  Given 3 weeks of samples today as in the donut hole. -In normal sinus rhythm on EKG today.  Hypertension, variable -Continue current medications.  If systolic blood pressure stays over 130 at follow-up, recommend switching metoprolol tartrate to carvedilol. -Goal BP is <130/80.  Recommend DASH diet (high in vegetables, fruits, low-fat dairy products, whole grains, poultry, fish, and nuts and low in sweets, sugar-sweetened beverages, and red meats), salt restriction and increase physical activity.  Disposition - Follow-up in 05/2022 with Dr. [name] has planned.        Medication Adjustments/Labs and Tests Ordered: Current medicines are reviewed at length with the patient today.  Concerns regarding medicines are outlined above.  Orders Placed This Encounter  Procedures   EKG 12-Lead   Meds ordered this encounter  Medications   apixaban (ELIQUIS) 5 MG TABS tablet    Sig: Take 1 tablet (5 mg total) by mouth 2 (two) times daily.    Dispense:  21 tablet    Refill:  0    Lot [id number] EXP AUG 24   x 2    Patient Instructions  Medication Instructions:  No changes  *If you need a refill on your  cardiac medications before your next appointment, please call your pharmacy*   Lab Work: Not needed    Testing/Procedures: Not needed   Follow-Up: At [facility], you and your health needs are our priority.  As part of our continuing mission to provide you with exceptional heart care, we have created designated Provider Care Teams.  These Care Teams include your primary Cardiologist (physician) and Advanced Practice Providers (APPs -  Physician Assistants and Nurse Practitioners) who all work together to provide you with the care you need, when you need it.     Your next appointment:   10 month(s)  The format for your next appointment:   In Person  Provider:   Peter [name], MD     Signed, Warren Lacy, PA-C  07/26/2021 12:22 PM    [facility]

## 2021-07-26 ENCOUNTER — Other Ambulatory Visit: Payer: Self-pay

## 2021-07-26 ENCOUNTER — Encounter: Payer: Self-pay | Admitting: Physician Assistant

## 2021-07-26 ENCOUNTER — Ambulatory Visit (INDEPENDENT_AMBULATORY_CARE_PROVIDER_SITE_OTHER): Payer: Medicare Other | Admitting: Physician Assistant

## 2021-07-26 VITALS — BP 152/78 | HR 56 | Ht 65.0 in | Wt 167.2 lb

## 2021-07-26 DIAGNOSIS — I10 Essential (primary) hypertension: Secondary | ICD-10-CM | POA: Diagnosis not present

## 2021-07-26 DIAGNOSIS — I447 Left bundle-branch block, unspecified: Secondary | ICD-10-CM | POA: Diagnosis not present

## 2021-07-26 DIAGNOSIS — I480 Paroxysmal atrial fibrillation: Secondary | ICD-10-CM | POA: Diagnosis not present

## 2021-07-26 DIAGNOSIS — M25511 Pain in right shoulder: Secondary | ICD-10-CM | POA: Diagnosis not present

## 2021-07-26 DIAGNOSIS — E7800 Pure hypercholesterolemia, unspecified: Secondary | ICD-10-CM | POA: Diagnosis not present

## 2021-07-26 MED ORDER — APIXABAN 5 MG PO TABS: 5.0000 mg | ORAL_TABLET | Freq: Two times a day (BID) | ORAL | 0 refills | Status: DC

## 2021-07-26 NOTE — Patient Instructions (Signed)
Medication Instructions:  No changes  *If you need a refill on your cardiac medications before your next appointment, please call your pharmacy*   Lab Work: Not needed    Testing/Procedures: Not needed   Follow-Up: At [facility], you and your health needs are our priority.  As part of our continuing mission to provide you with exceptional heart care, we have created designated Provider Care Teams.  These Care Teams include your primary Cardiologist (physician) and Advanced Practice Providers (APPs -  Physician Assistants and Nurse Practitioners) who all work together to provide you with the care you need, when you need it.     Your next appointment:   10 month(s)  The format for your next appointment:   In Person  Provider:   Peter [name], MD

## 2021-08-02 ENCOUNTER — Ambulatory Visit: Payer: Medicare Other

## 2021-08-03 ENCOUNTER — Ambulatory Visit
Admission: RE | Admit: 2021-08-03 | Discharge: 2021-08-03 | Disposition: A | Discharge status: Home / Self Care | Payer: Medicare Other | Source: Ambulatory Visit | Attending: Family Medicine | Admitting: Family Medicine

## 2021-08-03 ENCOUNTER — Other Ambulatory Visit: Payer: Self-pay

## 2021-08-03 DIAGNOSIS — Z1231 Encounter for screening mammogram for malignant neoplasm of breast: Secondary | ICD-10-CM | POA: Diagnosis not present

## 2021-08-23 ENCOUNTER — Other Ambulatory Visit: Payer: Self-pay | Admitting: Cardiology

## 2021-10-03 ENCOUNTER — Telehealth: Payer: Self-pay | Admitting: Cardiology

## 2021-10-03 NOTE — Telephone Encounter (Signed)
Pt c/o medication issue:  1. Name of Medication: apixaban (ELIQUIS) 5 MG TABS tablet  2. How are you currently taking this medication (dosage and times per day)? As written  3. Are you having a reaction (difficulty breathing--STAT)? No.   4. What is your medication issue? Patient needs a new prescription 90 bill in sent to  [facility] [place], [place]

## 2021-10-13 ENCOUNTER — Telehealth: Payer: Self-pay | Admitting: Cardiology

## 2021-10-13 MED ORDER — APIXABAN 5 MG PO TABS: 5.0000 mg | ORAL_TABLET | Freq: Two times a day (BID) | ORAL | 5 refills | Status: DC

## 2021-10-13 NOTE — Telephone Encounter (Signed)
° ° °*  STAT* If patient is at the pharmacy, call can be transferred to refill team.   1. Which medications need to be refilled? (please list name of each medication and dose if known) apixaban (ELIQUIS) 5 MG TABS tablet  2. Which pharmacy/location (including street and city if local pharmacy) is medication to be sent to?[facility] [place], [place]  3. Do they need a 30 day or 90 day supply? 90 days  [facility] said [facility] been waiting refill today so they can send the pt's prescription

## 2021-10-13 NOTE — Telephone Encounter (Signed)
Prescription refill request for Eliquis received. Indication:Afib Last office visit:11/22 Scr:1.0 Age: 81 Weight:75.8 kg  Prescription refilled

## 2021-10-23 ENCOUNTER — Telehealth: Payer: Self-pay | Admitting: Cardiology

## 2021-10-23 DIAGNOSIS — R0989 Other specified symptoms and signs involving the circulatory and respiratory systems: Secondary | ICD-10-CM | POA: Diagnosis not present

## 2021-10-23 DIAGNOSIS — Z79899 Other long term (current) drug therapy: Secondary | ICD-10-CM | POA: Diagnosis not present

## 2021-10-23 DIAGNOSIS — R110 Nausea: Secondary | ICD-10-CM | POA: Diagnosis not present

## 2021-10-23 DIAGNOSIS — I4891 Unspecified atrial fibrillation: Secondary | ICD-10-CM | POA: Diagnosis not present

## 2021-10-23 DIAGNOSIS — Z7901 Long term (current) use of anticoagulants: Secondary | ICD-10-CM | POA: Diagnosis not present

## 2021-10-23 DIAGNOSIS — Z885 Allergy status to narcotic agent status: Secondary | ICD-10-CM | POA: Diagnosis not present

## 2021-10-23 DIAGNOSIS — R112 Nausea with vomiting, unspecified: Secondary | ICD-10-CM | POA: Diagnosis not present

## 2021-10-23 DIAGNOSIS — I447 Left bundle-branch block, unspecified: Secondary | ICD-10-CM | POA: Diagnosis not present

## 2021-10-23 DIAGNOSIS — I517 Cardiomegaly: Secondary | ICD-10-CM | POA: Diagnosis not present

## 2021-10-23 DIAGNOSIS — I10 Essential (primary) hypertension: Secondary | ICD-10-CM | POA: Diagnosis not present

## 2021-10-23 DIAGNOSIS — R55 Syncope and collapse: Secondary | ICD-10-CM | POA: Diagnosis not present

## 2021-10-23 DIAGNOSIS — R079 Chest pain, unspecified: Secondary | ICD-10-CM | POA: Diagnosis not present

## 2021-10-23 NOTE — Telephone Encounter (Signed)
Pt c/o BP issue: STAT if pt c/o blurred vision, one-sided weakness or slurred speech  1. What are your last 5 BP readings?  202/110 193/120   2. Are you having any other symptoms (ex. Dizziness, headache, blurred vision, passed out)? Nausea Patient had diarrhea and vomiting yesterday  3. What is your BP issue? Patient concerned about BP

## 2021-10-23 NOTE — Telephone Encounter (Signed)
Direct call from operator about elevated BP  Yesterday while cooking, she felt like she was going to pass out EMS was called - vitals OK, did not feel like she had a stroke  She reports after this, she had diarrhea, threw up, just laid around  Today she has been very nauseous Her BP is "off the charts" - SBP >200  Advised she present to nearest ED for evaluation of HTN She lives hear a [facility] facility   She then asked if she should come to [facility] since Dr. Doug Sou team is there She asked if her passing out could be from her heart - explained unsure without testing  Reiterated that she should go to an ED for evaluation and I will notify MD Message routed

## 2021-10-23 NOTE — Telephone Encounter (Signed)
[name], Lisa M, MD  Fidel Levy, RN Caller: Unspecified (Today,  9:00 AM) Agree - needs urgent evaluation in ED   Lisa [name] MD, [facility]

## 2021-10-23 NOTE — Telephone Encounter (Signed)
Spoke with pt regarding the need for an office visit with Dr. [name] after recent visit to ED. Attempted to make pt an appointment, unable to find a time sooner than later that works for the pt. Will route to Dr. Doug Sou nurse to help make appointment. Pt verbalizes understanding.

## 2021-10-23 NOTE — Telephone Encounter (Signed)
I could see her at 4:40 pm on Feb 6 or Feb 9  PJ

## 2021-10-23 NOTE — Telephone Encounter (Signed)
Patient went to the ER and they told her she needed to be seen by dr [name]. Calling looking for a appt was nothing available. Patient would like the nurse to give her a call

## 2021-10-24 NOTE — Telephone Encounter (Signed)
Spoke to patient she stated her B/P is better this morning 140/82 pulse 64. Appointment scheduled with Dr.Jordan 11/02/21 at 4:40 pm.Advised to monitor B/P daily and bring readings to appointment.

## 2021-10-29 NOTE — Progress Notes (Signed)
Cardiology Office Note:    Date:  11/02/2021   ID:  Lisa [name], DOB [birth year], MRN [id number]  PCP:  Kristen Loader, [place] Cardiologist: Kayleanna Lorman [name], MD   Reason for visit: Follow-up ED visit for HTN  History of Present Illness:    Lisa [name] is a 81 y.o. female with a hx of atrial fibrillation s/p DCCV in 07/2018 (sx SOB, swelling, indigestion), hypertension, hyperlipidemia, left bundle branch block.   Myoview in January 2019 was normal.  She last saw Dr. [name] on June 19, 2021 and was doing well.    She went to [facility] and complained of sudden right shoulder pain along with nausea and one episode of vomiting.  She took aspirin and her symptoms improved.  Troponin unremarkable.  Chest x-ray with mild enlargement of the cardiomediastinal silhouette and borderline pulmonary venous congestion. Pain felt to be musculoskeletal related to fall.   Recently she was seen in ED at [facility]. She had an episode of syncope at home. Following this she had acute N/V and diarrhea. BP was quite high. Ecg showed NSR with chronic LBBB. BP was quite high to 202/110. Labs including troponins were negative. She hasn't had any further dizziness or syncope but has persistent elevated BP.  She is currently on metoprolol, losartan and HCTZ.     Past Medical History:  Diagnosis Date   Bundle branch block, left    Dyslipidemia    GERD (gastroesophageal reflux disease)    Hypercholesteremia    Hypertension    LBBB (left bundle branch block)    UTI (urinary tract infection)     Past Surgical History:  Procedure Laterality Date   bladder polyp removal     BREAST EXCISIONAL BIOPSY Right    60 yrs ago- Benign   BREAST LUMPECTOMY Left    No Visible Scar, said 30 + years ago in situ, no chemo, no radiation, or hormone replacement     CARDIOVERSION N/A 07/30/2018   Procedure: CARDIOVERSION;  Surgeon: Jerline Pain, MD;  Location: [place] [facility];   Service: Cardiovascular;  Laterality: N/A;   CESAREAN SECTION      Current Medications: Current Meds  Medication Sig   apixaban (ELIQUIS) 5 MG TABS tablet Take 1 tablet (5 mg total) by mouth 2 (two) times daily.   calcium carbonate (TUMS - DOSED IN MG ELEMENTAL CALCIUM) 500 MG chewable tablet Chew 1 tablet by mouth daily as needed for indigestion or heartburn.   Cholecalciferol (VITAMIN D3) 50 MCG (2000 UT) TABS Take 1 tablet by mouth daily.   Ergocalciferol (VITAMIN D2) 2000 units TABS Take 2,000 Units by mouth daily.   hydrochlorothiazide (HYDRODIURIL) 25 MG tablet TAKE 1 TABLET BY MOUTH EVERY DAY   losartan (COZAAR) 100 MG tablet TAKE 1 TABLET BY MOUTH EVERY DAY   metoprolol tartrate (LOPRESSOR) 25 MG tablet Take 25 mg 1 tablet in am and 12.5 mg 1/2 tablet in pm   pantoprazole (PROTONIX) 40 MG tablet Take 40 mg by mouth daily as needed for heartburn.   simvastatin (ZOCOR) 40 MG tablet Take 40 mg by mouth daily.     Allergies:   Benicar [olmesartan], Codeine, Fosamax [alendronate sodium], Lisinopril, and Norvasc [amlodipine]   Social History   Socioeconomic History   Marital status: Married    Spouse name: Not on file   Number of children: 2   Years of education: Not on file   Highest education level: Not on file  Occupational  History   Not on file  Tobacco Use   Smoking status: Former    Years: 20.00    Types: Cigarettes    Quit date: 1993    Years since quitting: 30.1   Smokeless tobacco: Never  Vaping Use   Vaping Use: Never used  Substance and Sexual Activity   Alcohol use: Yes   Drug use: No   Sexual activity: Not on file  Other Topics Concern   Not on file  Social History Narrative   Not on file   Social Determinants of Health   Financial Resource Strain: Not on file  Food Insecurity: Not on file  Transportation Needs: Not on file  Physical Activity: Not on file  Stress: Not on file  Social Connections: Not on file     Family History: The patient's  family history includes Breast cancer in her mother; CVA in her brother and brother; Crohn's disease in her brother; Diabetes in her brother; Hip fracture in her mother; Other in her brother; Renal cancer in her brother; Ulcers in her father.  ROS:   Please see the history of present illness.     EKGs/Labs/Other Studies Reviewed:    EKG:  The ekg by EMS  demonstrates minus bradycardia, left axis deviation, left bundle branch block,   Recent Labs: No results found for requested labs within last 8760 hours.   Recent Lipid Panel No results found for: CHOL, TRIG, HDL, LDLCALC, LDLDIRECT  Dated 06/03/20: cholesterol 159, triglycerides 94, HDL 59, LDL 83. Dated 06/15/21: A1c 6.3, Hgb 12.5. chemistries and TSH normal.  Physical Exam:    VS:  BP (!) 168/90 (BP Location: Right Arm, Patient Position: Sitting, Cuff Size: Normal)    Pulse (!) 58    Ht 5\' 5"  (1.651 m)    Wt 166 lb (75.3 kg)    BMI 27.62 kg/m    No data found.  Wt Readings from Last 3 Encounters:  11/02/21 166 lb (75.3 kg)  07/26/21 167 lb 3.2 oz (75.8 kg)  06/19/21 165 lb 12.8 oz (75.2 kg)     GEN:  Well nourished, well developed in no acute distress HEENT: Normal NECK: No JVD; No carotid bruits CARDIAC: RRR, no murmurs, rubs, gallops RESPIRATORY:  Clear to auscultation without rales, wheezing or rhonchi  ABDOMEN: Soft, non-tender, non-distended MUSCULOSKELETAL: No edema; No deformity  SKIN: Warm and dry NEUROLOGIC:  Alert and oriented PSYCHIATRIC:  Normal affect     ASSESSMENT AND PLAN    HTN resistant. Likely essential HTN.  -will add Cardura 2 mg qhs. Unable to titrate beta blocker due to bradycardia. Intolerant of ACEi and amlodipine before. Will also check Renal duplex.  - Dash diet.  2. Paroxysmal atrial fibrillation -Status post DCCV in November 2019 -Echo showed mild LV dysfunction with dyssynergy due to LBBB. Moderate LAE.   -Continue Eliquis for stroke prevention and metoprolol for rate control.   -In  normal sinus rhythm   3. LBBB chronic.     Disposition - Follow-up in HTN clinic in 3 weeks. Follow up with me 3 months.        Medication Adjustments/Labs and Tests Ordered: Current medicines are reviewed at length with the patient today.  Concerns regarding medicines are outlined above.  No orders of the defined types were placed in this encounter.  No orders of the defined types were placed in this encounter.   Patient Instructions  We will check a renal duplex  We will start doxazocin 2 mg at  bedtime.   Schedule appointment with Hypertension clinic in 3 weeks  Schedule appointment with Dr.Mateya Torti in 3 months   Signed, Regana Kemple [name], MD  11/02/2021 4:33 PM    [place]

## 2021-11-02 ENCOUNTER — Encounter: Payer: Self-pay | Admitting: Cardiology

## 2021-11-02 ENCOUNTER — Ambulatory Visit (INDEPENDENT_AMBULATORY_CARE_PROVIDER_SITE_OTHER): Payer: Medicare Other | Admitting: Cardiology

## 2021-11-02 ENCOUNTER — Other Ambulatory Visit: Payer: Self-pay

## 2021-11-02 VITALS — BP 168/90 | HR 58 | Ht 65.0 in | Wt 166.0 lb

## 2021-11-02 DIAGNOSIS — I10 Essential (primary) hypertension: Secondary | ICD-10-CM

## 2021-11-02 DIAGNOSIS — I447 Left bundle-branch block, unspecified: Secondary | ICD-10-CM | POA: Diagnosis not present

## 2021-11-02 DIAGNOSIS — I480 Paroxysmal atrial fibrillation: Secondary | ICD-10-CM

## 2021-11-02 DIAGNOSIS — E7800 Pure hypercholesterolemia, unspecified: Secondary | ICD-10-CM

## 2021-11-02 MED ORDER — DOXAZOSIN MESYLATE 2 MG PO TABS: 2.0000 mg | ORAL_TABLET | Freq: Every day | ORAL | 11 refills | Status: DC

## 2021-11-02 NOTE — Patient Instructions (Addendum)
We will check a renal duplex  We will start doxazocin 2 mg at bedtime.   Schedule appointment with Hypertension clinic in 3 weeks  Schedule appointment with Dr.Jordan in 3 months

## 2021-11-02 NOTE — Addendum Note (Signed)
Addended by: Kathyrn Lass on: 11/02/2021 04:37 PM   Modules accepted: Orders

## 2021-11-10 ENCOUNTER — Telehealth: Payer: Self-pay | Admitting: Cardiology

## 2021-11-10 NOTE — Telephone Encounter (Signed)
Spoke to patient Dr.Jordan's advice given.Advised to take HCTZ 50 mg daily until appointment with HTN clinic 3/6 at 9:30 am.

## 2021-11-10 NOTE — Telephone Encounter (Signed)
For swelling she can double up on HCTZ to 50 mg daily until seen by HTN clinic  Mirah Nevins [name] MD, [facility]

## 2021-11-10 NOTE — Telephone Encounter (Signed)
Spoke to patient she stated she started taking Doxazosin 2 mg at night last Thursday.Stated B/P had improved until yesterday.B/P 150/65,today 148/67 pulse 82.She also has a lot of swelling in both ankles.Advised I will send message to [place] for advice.

## 2021-11-10 NOTE — Telephone Encounter (Signed)
Pt c/o swelling: STAT is pt has developed SOB within 24 hours  If swelling, where is the swelling located? Both ankles  How much weight have you gained and in what time span? N/A   Have you gained 3 pounds in a day or 5 pounds in a week? No   Do you have a log of your daily weights (if so, list)? No   Are you currently taking a fluid pill? Yes   Are you currently SOB? No   Have you traveled recently? No     doxazosin (CARDURA) 2 MG tablet is causing the swelling.

## 2021-11-14 ENCOUNTER — Ambulatory Visit: Payer: Medicare Other | Admitting: Plastic Surgery

## 2021-11-15 ENCOUNTER — Telehealth: Payer: Self-pay

## 2021-11-15 DIAGNOSIS — N1831 Chronic kidney disease, stage 3a: Secondary | ICD-10-CM | POA: Diagnosis not present

## 2021-11-15 DIAGNOSIS — E1169 Type 2 diabetes mellitus with other specified complication: Secondary | ICD-10-CM | POA: Diagnosis not present

## 2021-11-15 DIAGNOSIS — I10 Essential (primary) hypertension: Secondary | ICD-10-CM | POA: Diagnosis not present

## 2021-11-15 DIAGNOSIS — R519 Headache, unspecified: Secondary | ICD-10-CM | POA: Diagnosis not present

## 2021-11-15 MED ORDER — METOPROLOL TARTRATE 25 MG PO TABS: 25.0000 mg | ORAL_TABLET | Freq: Two times a day (BID) | ORAL | 3 refills | Status: DC

## 2021-11-15 NOTE — Telephone Encounter (Signed)
Received a call from patient.She stated since she started taking Doxazosin she has been dizzy,nausea,headache.Stated she did not take Doxazosin last night.B/P at present 193/110 pulse 65.Spoke to DOD Dr.Croitoru he advised to try taking Doxazosin 2 mg again.He advised to get up slowly and change positions slowly.He advised to increase Metoprolol to 25 mg 1 tablet twice a day.He advised to call office tomorrow to report B/P reading.

## 2021-11-17 NOTE — Telephone Encounter (Signed)
Spoke to patient she stated she is still having dizziness.Dr.Jordan advised take 1/2 tablet of Doxazosin.Advised B/P looks better.Advised to call back if she continues to have dizziness.

## 2021-11-17 NOTE — Telephone Encounter (Signed)
° °  Pt is calling back, she requesting to speak with Lisa [name] she said she will give her her BP readings, she wanted to give it to [facility]

## 2021-11-17 NOTE — Telephone Encounter (Signed)
Returned call to pt she still c/o headache,nausea and vomiting(vomiting woke her up@~5am). Pt states that she went to PCP saw NP they told her she had a migraine and gave her rx for prednisone. She took tylenol this morning and she states that it did help "a little". But with Pred and TYL she still has headache and nausea. Has not vomited since 5am.  She started the doxazosin 2-23 at 930pm 11-16-21 915am 134/61  HR 73 2-23      330pm  155/75 HR  56 11-17-21     9am  136/68  HR 56

## 2021-11-17 NOTE — Telephone Encounter (Signed)
BP looks much better  Zakery Normington [name] MD, [facility]

## 2021-11-17 NOTE — Addendum Note (Signed)
Addended by: Kathyrn Lass on: 11/17/2021 02:19 PM   Modules accepted: Orders

## 2021-11-20 DIAGNOSIS — K219 Gastro-esophageal reflux disease without esophagitis: Secondary | ICD-10-CM | POA: Diagnosis not present

## 2021-11-20 DIAGNOSIS — G441 Vascular headache, not elsewhere classified: Secondary | ICD-10-CM | POA: Diagnosis not present

## 2021-11-20 DIAGNOSIS — R110 Nausea: Secondary | ICD-10-CM | POA: Diagnosis not present

## 2021-11-20 DIAGNOSIS — R5383 Other fatigue: Secondary | ICD-10-CM | POA: Diagnosis not present

## 2021-11-20 DIAGNOSIS — I10 Essential (primary) hypertension: Secondary | ICD-10-CM | POA: Diagnosis not present

## 2021-11-23 ENCOUNTER — Telehealth: Payer: Self-pay | Admitting: Cardiology

## 2021-11-23 ENCOUNTER — Ambulatory Visit (INDEPENDENT_AMBULATORY_CARE_PROVIDER_SITE_OTHER): Payer: Medicare Other

## 2021-11-23 DIAGNOSIS — I480 Paroxysmal atrial fibrillation: Secondary | ICD-10-CM

## 2021-11-23 DIAGNOSIS — I447 Left bundle-branch block, unspecified: Secondary | ICD-10-CM

## 2021-11-23 NOTE — Progress Notes (Unsigned)
Enrolled for [employer] to mail a ZIO XT long term holter monitor to the patients address on file.  

## 2021-11-23 NOTE — Telephone Encounter (Signed)
STAT if HR is under 50 or over 120 ?(normal HR is 60-100 beats per minute) ? ?What is your heart rate? 97, this morning,  ,88,86, 91,82,73 ? ?Do you have a log of your heart rate readings (document readings)?  ? ?Do you have any other symptoms? 158/80 yesterday afternoon- dizzy and short of breath- not at this time ? ?

## 2021-11-23 NOTE — Telephone Encounter (Signed)
I would stop her Cardura. Place 2 week Zio patch monitor to assess rhythm. Keep track of BP. If BP runs high could take Cardura PRN.  ? ?Brighten Orndoff [name] MD, [facility] ? ?

## 2021-11-23 NOTE — Telephone Encounter (Signed)
Returned call to patient-patient reports worsening SOB/dizziness, increased HR since appointment with Dr. [name].  She reports feeling unwell for several weeks.  She states she saw Dr. [name] 2/9, cardura added for HTN, hx of PAF in NSR on Eliquis. Since OV, HCTZ was increased to 50 mg due to LE edema (this has resolved) and Cardura decreased to 1/2 tablet and metoprolol increased to 25 mg BID (see phone encounters).    ? ?Starting Tuesday 2/28, patient reports increased HR and increase SOB/dizziness.   Reports she was unable to sleep last night.  She believes she may be going in and our of atrial fib.   ? ?BP/HR readings: ?2/28 119/73 HR 94 ?3/1 84/56 HR 76, 111/70 88, 104/66 86, 119/72 91, 1132/75 82, 158/80 73, ?3/2 115/77 HR 97 ?Now: 131/78 73 ? ?No distress/SOB noted on the phone.  ? ?Advised will route to Dr. [name] to review.  ? ?Appt with htn clinic 3/6 and renal US 3/7 ?

## 2021-11-23 NOTE — Telephone Encounter (Signed)
Spoke to patient Dr.Jordan's advice given.She will stop taking Cardura.Advised to continue to monitor B/P and call back if elevated.Advised heart monitor will be mailed to her home.She is to wear for 14 days and mail back.I will call back when Dr.Jordan reviews results. ?

## 2021-11-27 ENCOUNTER — Other Ambulatory Visit: Payer: Self-pay

## 2021-11-27 ENCOUNTER — Encounter: Payer: Self-pay | Admitting: Cardiology

## 2021-11-27 ENCOUNTER — Encounter: Payer: Self-pay | Admitting: Pharmacist

## 2021-11-27 ENCOUNTER — Ambulatory Visit: Payer: Medicare Other | Admitting: Pharmacist

## 2021-11-27 ENCOUNTER — Ambulatory Visit (INDEPENDENT_AMBULATORY_CARE_PROVIDER_SITE_OTHER): Payer: Medicare Other | Admitting: Cardiology

## 2021-11-27 VITALS — BP 148/78 | HR 71 | Ht 65.0 in | Wt 166.0 lb

## 2021-11-27 VITALS — BP 148/78 | HR 65 | Resp 14 | Ht 65.0 in | Wt 166.0 lb

## 2021-11-27 DIAGNOSIS — I480 Paroxysmal atrial fibrillation: Secondary | ICD-10-CM | POA: Diagnosis not present

## 2021-11-27 DIAGNOSIS — N1831 Chronic kidney disease, stage 3a: Secondary | ICD-10-CM | POA: Insufficient documentation

## 2021-11-27 DIAGNOSIS — R7301 Impaired fasting glucose: Secondary | ICD-10-CM | POA: Insufficient documentation

## 2021-11-27 DIAGNOSIS — E782 Mixed hyperlipidemia: Secondary | ICD-10-CM | POA: Insufficient documentation

## 2021-11-27 DIAGNOSIS — Z87442 Personal history of urinary calculi: Secondary | ICD-10-CM | POA: Insufficient documentation

## 2021-11-27 DIAGNOSIS — M810 Age-related osteoporosis without current pathological fracture: Secondary | ICD-10-CM | POA: Insufficient documentation

## 2021-11-27 DIAGNOSIS — E1169 Type 2 diabetes mellitus with other specified complication: Secondary | ICD-10-CM | POA: Insufficient documentation

## 2021-11-27 DIAGNOSIS — Z853 Personal history of malignant neoplasm of breast: Secondary | ICD-10-CM | POA: Insufficient documentation

## 2021-11-27 DIAGNOSIS — E559 Vitamin D deficiency, unspecified: Secondary | ICD-10-CM | POA: Insufficient documentation

## 2021-11-27 DIAGNOSIS — I4891 Unspecified atrial fibrillation: Secondary | ICD-10-CM

## 2021-11-27 DIAGNOSIS — I10 Essential (primary) hypertension: Secondary | ICD-10-CM | POA: Insufficient documentation

## 2021-11-27 NOTE — Patient Instructions (Addendum)
No AVS printed.  Patient seen by Dr Percival Spanish. ?

## 2021-11-27 NOTE — Progress Notes (Signed)
?  ?Cardiology Office Note ? ? ?Date:  11/27/2021  ? ?ID:  Lisa [name], DOB 06-05-1941, MRN [id number] ? ?PCP:  Kristen Loader, FNP  ?Cardiologist:   Peter [name], MD ?Referring:  Kristen Loader, FNP ? ?Chief Complaint  ?Patient presents with  ? Atrial Fibrillation  ? ? ?  ?History of Present Illness: ?Lisa [name] is a 81 y.o. female who presents for evaluation of palpitations.  She is seen by Dr. [name].  She has a history of of atrial fibrillation s/p DCCV in 07/2018 .  She has a history of syncope seen at  [facility] Jan 2023.  She saw Dr. [name] and a 2-week monitor.  He delivered to her house.  She also had a hypertensive urgency and renal Dopplers have been ordered. ? ?She was seen today for a nurse visit and was noted to be in atrial fibrillation on EKG. she has had some nausea and vomiting and headache.  She is actually seeing a GI doctor.  She is however felt worse just in the last few days since the 28th.  She states she is feeling dizzy.  She is felt a fluttering in her chest.  She had to sit up.  It felt similar to her previous fibrillation.  It kind of comes and goes a little bit but she really thinks it could have been a persistent fibrillation just maybe not as fast at times.  It is rate controlled today when she was seen.   ? ?She has a little bit of chest burning.  She feels somewhat lightheaded.  She just feels weak and fatigued. ? ? ?Past Medical History:  ?Diagnosis Date  ? Bundle branch block, left   ? Dyslipidemia   ? GERD (gastroesophageal reflux disease)   ? Hypercholesteremia   ? Hypertension   ? LBBB (left bundle branch block)   ? UTI (urinary tract infection)   ? ? ?Past Surgical History:  ?Procedure Laterality Date  ? bladder polyp removal    ? BREAST EXCISIONAL BIOPSY Right   ? 60 yrs ago- Benign  ? BREAST LUMPECTOMY Left   ? No Visible Scar, said 30 + years ago in situ, no chemo, no radiation, or hormone replacement    ? CARDIOVERSION N/A 07/30/2018  ? Procedure: CARDIOVERSION;   Surgeon: Jerline Pain, MD;  Location: [facility];  Service: Cardiovascular;  Laterality: N/A;  ? CESAREAN SECTION    ? ? ? ?Current Outpatient Medications  ?Medication Sig Dispense Refill  ? apixaban (ELIQUIS) 5 MG TABS tablet Take 1 tablet (5 mg total) by mouth 2 (two) times daily. 60 tablet 5  ? calcium carbonate (TUMS - DOSED IN MG ELEMENTAL CALCIUM) 500 MG chewable tablet Chew 1 tablet by mouth daily as needed for indigestion or heartburn.    ? Cholecalciferol (VITAMIN D3) 50 MCG (2000 UT) TABS Take 1 tablet by mouth daily.    ? hydrochlorothiazide (HYDRODIURIL) 25 MG tablet daily. Take 1tablet daily 90 tablet 3  ? losartan (COZAAR) 100 MG tablet TAKE 1 TABLET BY MOUTH EVERY DAY 90 tablet 3  ? metoprolol tartrate (LOPRESSOR) 25 MG tablet Take 1 tablet (25 mg total) by mouth 2 (two) times daily. (Patient taking differently: Take 25 mg by mouth 2 (two) times daily. Take 1 full tablet in the am and 1/2 tablet at night) 180 tablet 3  ? pantoprazole (PROTONIX) 40 MG tablet Take 40 mg by mouth daily as needed for heartburn.  4  ? simvastatin (ZOCOR)  40 MG tablet Take 40 mg by mouth daily.    ? ?No current facility-administered medications for this visit.  ? ? ?Allergies:   Codeine, Amlodipine, Fosamax [alendronate sodium], Lisinopril, and Olmesartan  ? ? ?ROS:  Please see the history of present illness.   Otherwise, review of systems are positive for none.   All other systems are reviewed and negative.  ? ? ?PHYSICAL EXAM: ?VS:  BP (!) 148/78   Pulse 71   Ht '5\' 5"'$  (1.651 m)   Wt 166 lb (75.3 kg)   SpO2 98%   BMI 27.62 kg/m?  , BMI Body mass index is 27.62 kg/m?. ?GENERAL:  Well appearing ?HEENT:  Pupils equal round and reactive, fundi not visualized, oral mucosa unremarkable ?NECK:  No jugular venous distention, waveform within normal limits, carotid upstroke brisk and symmetric, no bruits, no thyromegaly ?LYMPHATICS:  No cervical, inguinal adenopathy ?LUNGS:  Clear to auscultation bilaterally ?BACK:  No CVA  tenderness ?CHEST:  Unremarkable ?HEART:  PMI not displaced or sustained,S1 and S2 within normal limits, no S3, no clicks, no rubs, no murmurs, irregular ?ABD:  Flat, positive bowel sounds normal in frequency in pitch, no bruits, no rebound, no guarding, no midline pulsatile mass, no hepatomegaly, no splenomegaly ?EXT:  2 plus pulses throughout, no edema, no cyanosis no clubbing ?SKIN:  No rashes no nodules ?NEURO:  Cranial nerves II through XII grossly intact, motor grossly intact throughout ?PSYCH:  Cognitively intact, oriented to person place and time ? ? ? ?EKG:  EKG is ordered today. ?The ekg ordered today demonstrates atrial fibrillation, left bundle branch block, left axis deviation, no acute ST-T wave changes. ? ? ?Recent Labs: ?No results found for requested labs within last 8760 hours.  ? ? ?Lipid Panel ?No results found for: CHOL, TRIG, HDL, CHOLHDL, VLDL, LDLCALC, LDLDIRECT ?  ? ?Wt Readings from Last 3 Encounters:  ?11/27/21 166 lb (75.3 kg)  ?11/27/21 166 lb (75.3 kg)  ?11/02/21 166 lb (75.3 kg)  ?  ? ? ?Other studies Reviewed: ?Additional studies/ records that were reviewed today include: [facility] records, labs. ?Review of the above records demonstrates:  Please see elsewhere in the note.   ? ? ?ASSESSMENT AND PLAN: ? ?Atrial fibrillation:   She needs another cardioversion.   Lisa [name] has a CHA2DS2 - VASc score of 4.   I think this has been persistent since the 28th.  I would like to schedule that this week because she is symptomatic.  She has not missed a dose of her Eliquis.  She will get labs today to include a TSH.  I will order an echocardiogram.  She will wear her husband's watch just to see if there is any sinus rhythm noted between now and the time of the cardioversion but I suspect again that this is persistent.  Afterwards she can talk to Dr. [name] about medications or even ablation although this is only the second episode since the 2019 cardioversion. ? ?HTN: Renal Dopplers have  been ordered.  Her blood pressure is mildly elevated but she can keep a blood pressure diary. ? ?Syncope: She has had no further syncope.  Monitor is pending. ? ?Current medicines are reviewed at length with the patient today.  The patient does not have concerns regarding medicines. ? ?The following changes have been made:  no change ? ?Labs/ tests ordered today include:  ? ?Orders Placed This Encounter  ?Procedures  ? TSH  ? CBC with Differential/Platelet  ? Basic metabolic  panel  ? ECHOCARDIOGRAM COMPLETE  ? ? ? ?Disposition:   FU with Dr. [name] after the cardioversion ? ? ?Signed, ?Minus Breeding, MD  ?11/27/2021 1:02 PM    ?[place] ? ? ? ?

## 2021-11-27 NOTE — Progress Notes (Signed)
Patient ID: Lisa [name]                 DOB: 1941-02-17                      MRN: [id number] ? ? ? ? ?HPI: ?Lisa [name] is a 81 y.o. female referred by Dr. [name] to HTN clinic. PMH is significant for LBBB, A Fib, HTN, HLD, CKD,  and T2DM.  Patient has had recent issues controlling blood pressure and pulse rate. ? ?Patient presents today with husband. Reports she feels awful. On 1/29 she fainted. EMS was called and could not find anything abnormal in her vitals. Has been having recurrent nausea over past month which she is not sure is related to her GERD. Has appointment with GI later this month. ? ?Home BP readings: ?2/27: 164/76, 76 ?3/1: 84/56, 76 ?3/2: 115/77, 97 ?3/3: 152/76, 81 ?3/4: 135/78, 79 ?3/5: 120/84, 94 ? ?Patient is worried she may have gone back into atrial fibrillation.  Had cardioversion on 07/30/2018.  Feels like her "heart is racing." ? ?Current HTN meds:  ? ?Metoprolol '25mg'$  BID ?HCTZ '25mg'$  daily ?Losartan '100mg'$  daily ? ?Previously tried:  ?Doxazosin  ?Amlodipine ? ?BP goal: <130/80  ? ?Wt Readings from Last 3 Encounters:  ?11/02/21 166 lb (75.3 kg)  ?07/26/21 167 lb 3.2 oz (75.8 kg)  ?06/19/21 165 lb 12.8 oz (75.2 kg)  ? ?BP Readings from Last 3 Encounters:  ?11/02/21 (!) 168/90  ?07/26/21 (!) 152/78  ?06/19/21 (!) 158/80  ? ?Pulse Readings from Last 3 Encounters:  ?11/02/21 (!) 58  ?07/26/21 (!) 56  ?06/19/21 (!) 56  ? ? ?Renal function: ?CrCl cannot be calculated (Patient's most recent lab result is older than the maximum 21 days allowed.). ? ?Past Medical History:  ?Diagnosis Date  ? Bundle branch block, left   ? Dyslipidemia   ? GERD (gastroesophageal reflux disease)   ? Hypercholesteremia   ? Hypertension   ? LBBB (left bundle branch block)   ? UTI (urinary tract infection)   ? ? ?Current Outpatient Medications on File Prior to Visit  ?Medication Sig Dispense Refill  ? apixaban (ELIQUIS) 5 MG TABS tablet Take 1 tablet (5 mg total) by mouth 2 (two) times daily. 60 tablet 5  ? calcium  carbonate (TUMS - DOSED IN MG ELEMENTAL CALCIUM) 500 MG chewable tablet Chew 1 tablet by mouth daily as needed for indigestion or heartburn.    ? Cholecalciferol (VITAMIN D3) 50 MCG (2000 UT) TABS Take 1 tablet by mouth daily.    ? Ergocalciferol (VITAMIN D2) 2000 units TABS Take 2,000 Units by mouth daily.    ? hydrochlorothiazide (HYDRODIURIL) 25 MG tablet Take 2 tablets ( 50 mg ) daily 90 tablet 3  ? losartan (COZAAR) 100 MG tablet TAKE 1 TABLET BY MOUTH EVERY DAY 90 tablet 3  ? metoprolol tartrate (LOPRESSOR) 25 MG tablet Take 1 tablet (25 mg total) by mouth 2 (two) times daily. 180 tablet 3  ? pantoprazole (PROTONIX) 40 MG tablet Take 40 mg by mouth daily as needed for heartburn.  4  ? simvastatin (ZOCOR) 40 MG tablet Take 40 mg by mouth daily.    ? ?No current facility-administered medications on file prior to visit.  ? ? ?Allergies  ?Allergen Reactions  ? Benicar [Olmesartan] Other (See Comments)  ?  Possible photodermatitis  ? Codeine Other (See Comments)  ?  GI upset  ? Fosamax [Alendronate Sodium] Other (See Comments)  ?  Tooth problems  ? Lisinopril Cough  ? Norvasc [Amlodipine] Swelling  ? ? ? ?Assessment/Plan: ? ?1. Hypertension -  Patient BP in room 148/78 which is above goal of <130/80. Pulse rate has been very variable in past week and patient in general feels poorly. Concern she may be in a fib.  Has been on Eliquis continuously. ? ?Ordered EKG which showed atrial flutter.  Brought EKG to Dr Percival Spanish who agreed to see patient. ? ?Continue: ?Metoprolol '25mg'$  BID ?HCTZ '25mg'$  daily ?Losartan '100mg'$  daily ?Recheck as needed ? ?Karren Cobble, PharmD, BCACP, [place], CPP ?[address], [address] ?[place], [place], [address] ?Phone: [phone], Fax: [phone]  ?

## 2021-11-27 NOTE — Patient Instructions (Addendum)
Medication Instructions: ?Your physician recommends that you continue on your current medications as directed. Please refer to the Current Medication list given to you today.  ? ?*If you need a refill on your cardiac medications before your next appointment, please call your pharmacy* ? ?Lab Work: ?CBC/BMET/TSH TODAY  ?If you have labs (blood work) drawn today and your tests are completely normal, you will receive your results only by: ?MyChart Message (if you have MyChart) OR ?A paper copy in the mail ?If you have any lab test that is abnormal or we need to change your treatment, we will call you to review the results. ? ? ?Testing/Procedures: ?CARDIOVERSION ?WILL CALL YOU WITH DATE AND TIME  ? ?Your physician has requested that you have an echocardiogram. Echocardiography is a painless test that uses sound waves to create images of your heart. It provides your doctor with information about the size and shape of your heart and how well your heart?s chambers and valves are working. This procedure takes approximately one hour. There are no restrictions for this procedure. ? ?Follow-Up: ?At [facility], you and your health needs are our priority.  As part of our continuing mission to provide you with exceptional heart care, we have created designated Provider Care Teams.  These Care Teams include your primary Cardiologist (physician) and Advanced Practice Providers (APPs -  Physician Assistants and Nurse Practitioners) who all work together to provide you with the care you need, when you need it. ? ?We recommend signing up for the patient portal called "MyChart".  Sign up information is provided on this After Visit Summary.  MyChart is used to connect with patients for Virtual Visits (Telemedicine).  Patients are able to view lab/test results, encounter notes, upcoming appointments, etc.  Non-urgent messages can be sent to your provider as well.   ?To learn more about what you can do with MyChart, go to  [email or web address].   ? ?Your next appointment:   ?DR [name] 3-4 WEEKS   ? ?Other Instructions ? ?You WILL be scheduled for a Cardioversion Please arrive at the [facility] (Main Entrance A) at [facility]: [address] [place], [place] [address]    ? ?DIET: Nothing to eat or drink after midnight except a sip of water with medications (see medication instructions below) ? ?FYI: For your safety, and to allow [place] to monitor your vital signs accurately during the surgery/procedure we request that   ?if you have artificial nails, gel coating, SNS etc. Please have those removed prior to your surgery/procedure. Not having the nail coverings /polish removed may result in cancellation or delay of your surgery/procedure. ? ?Medication Instructions: ?Hold HYDROCHLOROTHIAZIDE  ? ?Continue your anticoagulant: ELIQUIS  ?You will need to continue your anticoagulant after  your procedure until you are told by your  ?Provider that it is safe to stop ? ?Labs:  ?TODAY  ? ?You must have a responsible person to drive you home and stay in the waiting area during your procedure. Failure to do so could result in cancellation. ? ?Interior and spatial designer cards. ? ?*Special Note: Every effort is made to have your procedure done on time. Occasionally there are emergencies that occur at the hospital that may cause delays. Please be patient if a delay does occur.  ? ?

## 2021-11-28 ENCOUNTER — Encounter (HOSPITAL_BASED_OUTPATIENT_CLINIC_OR_DEPARTMENT_OTHER): Payer: Self-pay | Admitting: *Deleted

## 2021-11-28 ENCOUNTER — Ambulatory Visit (HOSPITAL_COMMUNITY)
Admission: RE | Disposition: A | Discharge status: Home / Self Care | Payer: Self-pay | Source: Home / Self Care | Attending: Internal Medicine

## 2021-11-28 ENCOUNTER — Ambulatory Visit (HOSPITAL_COMMUNITY)
Admission: RE | Admit: 2021-11-28 | Discharge: 2021-11-28 | Disposition: A | Discharge status: Home / Self Care | Payer: Medicare Other | Attending: Internal Medicine | Admitting: Internal Medicine

## 2021-11-28 ENCOUNTER — Other Ambulatory Visit: Payer: Self-pay

## 2021-11-28 ENCOUNTER — Other Ambulatory Visit (HOSPITAL_BASED_OUTPATIENT_CLINIC_OR_DEPARTMENT_OTHER): Payer: Self-pay | Admitting: *Deleted

## 2021-11-28 ENCOUNTER — Other Ambulatory Visit: Payer: Self-pay | Admitting: Cardiology

## 2021-11-28 ENCOUNTER — Ambulatory Visit (HOSPITAL_BASED_OUTPATIENT_CLINIC_OR_DEPARTMENT_OTHER)
Admission: RE | Admit: 2021-11-28 | Discharge: 2021-11-28 | Disposition: A | Discharge status: Home / Self Care | Payer: Medicare Other | Source: Ambulatory Visit | Attending: Cardiology | Admitting: Cardiology

## 2021-11-28 ENCOUNTER — Encounter (HOSPITAL_COMMUNITY): Payer: Self-pay | Admitting: Internal Medicine

## 2021-11-28 DIAGNOSIS — I10 Essential (primary) hypertension: Secondary | ICD-10-CM

## 2021-11-28 DIAGNOSIS — Z79899 Other long term (current) drug therapy: Secondary | ICD-10-CM | POA: Diagnosis not present

## 2021-11-28 DIAGNOSIS — Z7901 Long term (current) use of anticoagulants: Secondary | ICD-10-CM | POA: Diagnosis not present

## 2021-11-28 DIAGNOSIS — K219 Gastro-esophageal reflux disease without esophagitis: Secondary | ICD-10-CM | POA: Diagnosis not present

## 2021-11-28 DIAGNOSIS — I447 Left bundle-branch block, unspecified: Secondary | ICD-10-CM | POA: Insufficient documentation

## 2021-11-28 DIAGNOSIS — R55 Syncope and collapse: Secondary | ICD-10-CM | POA: Diagnosis not present

## 2021-11-28 DIAGNOSIS — I480 Paroxysmal atrial fibrillation: Secondary | ICD-10-CM

## 2021-11-28 DIAGNOSIS — E7800 Pure hypercholesterolemia, unspecified: Secondary | ICD-10-CM | POA: Insufficient documentation

## 2021-11-28 DIAGNOSIS — I4891 Unspecified atrial fibrillation: Secondary | ICD-10-CM

## 2021-11-28 DIAGNOSIS — I4819 Other persistent atrial fibrillation: Secondary | ICD-10-CM | POA: Diagnosis not present

## 2021-11-28 HISTORY — PX: CARDIOVERSION: EP1203

## 2021-11-28 LAB — CBC WITH DIFFERENTIAL/PLATELET
Basophils Absolute: 0.1 10*3/uL (ref 0.0–0.2)
Basos: 1 %
EOS (ABSOLUTE): 0.1 10*3/uL (ref 0.0–0.4)
Eos: 2 %
Hematocrit: 40.7 % (ref 34.0–46.6)
Hemoglobin: 13.1 g/dL (ref 11.1–15.9)
Immature Grans (Abs): 0 10*3/uL (ref 0.0–0.1)
Immature Granulocytes: 0 %
Lymphocytes Absolute: 2.6 10*3/uL (ref 0.7–3.1)
Lymphs: 28 %
MCH: 26.4 pg — ABNORMAL LOW (ref 26.6–33.0)
MCHC: 32.2 g/dL (ref 31.5–35.7)
MCV: 82 fL (ref 79–97)
Monocytes Absolute: 0.6 10*3/uL (ref 0.1–0.9)
Monocytes: 6 %
Neutrophils Absolute: 6.1 10*3/uL (ref 1.4–7.0)
Neutrophils: 63 %
Platelets: 332 10*3/uL (ref 150–450)
RBC: 4.97 x10E6/uL (ref 3.77–5.28)
RDW: 13.5 % (ref 11.7–15.4)
WBC: 9.6 10*3/uL (ref 3.4–10.8)

## 2021-11-28 LAB — TSH: TSH: 1.8 u[IU]/mL (ref 0.450–4.500)

## 2021-11-28 LAB — BASIC METABOLIC PANEL
BUN/Creatinine Ratio: 22 (ref 12–28)
BUN: 19 mg/dL (ref 8–27)
CO2: 22 mmol/L (ref 20–29)
Calcium: 10.1 mg/dL (ref 8.7–10.3)
Chloride: 104 mmol/L (ref 96–106)
Creatinine, Ser: 0.86 mg/dL (ref 0.57–1.00)
Glucose: 94 mg/dL (ref 70–99)
Potassium: 4.6 mmol/L (ref 3.5–5.2)
Sodium: 143 mmol/L (ref 134–144)
eGFR: 69 mL/min/{1.73_m2} (ref >59)

## 2021-11-28 SURGERY — CARDIOVERSION (CATH LAB)

## 2021-11-28 MED ORDER — MIDAZOLAM HCL 5 MG/5ML IJ SOLN
INTRAMUSCULAR | Status: AC
  Filled 2021-11-28: qty 5

## 2021-11-28 MED ORDER — FENTANYL CITRATE (PF) 100 MCG/2ML IJ SOLN
INTRAMUSCULAR | Status: AC
  Filled 2021-11-28: qty 2

## 2021-11-28 MED ORDER — FENTANYL CITRATE (PF) 100 MCG/2ML IJ SOLN
INTRAMUSCULAR | Status: DC | PRN
  Administered 2021-11-28 (×2): 25 ug via INTRAVENOUS

## 2021-11-28 MED ORDER — MIDAZOLAM HCL 2 MG/2ML IJ SOLN
INTRAMUSCULAR | Status: DC | PRN
  Administered 2021-11-28: 1 mg via INTRAVENOUS
  Administered 2021-11-28 (×2): 2 mg via INTRAVENOUS

## 2021-11-28 MED ORDER — SODIUM CHLORIDE 0.9 % IV SOLN: INTRAVENOUS | Status: DC

## 2021-11-28 SURGICAL SUPPLY — 1 items: PAD DEFIB RADIO PHYSIO CONN (PAD) ×1 IMPLANT

## 2021-11-28 NOTE — Addendum Note (Signed)
Addended by: Alvina Filbert B on: 11/28/2021 02:18 PM ? ? Modules accepted: Orders ? ?

## 2021-11-28 NOTE — Progress Notes (Signed)
This encounter was created in error - please disregard.

## 2021-11-28 NOTE — H&P (Signed)
?  ?Cardiology Office Note ?  ?  ?Date:  11/27/2021  ?  ?ID:  Lisa [name], DOB September 14, 1941, MRN [id number] ?  ?PCP:  Lisa Loader, FNP  ?Cardiologist:   Lisa [name], MD ?Referring:  Lisa Loader, FNP ?  ?   ?Chief Complaint  ?[name] presents with  ? Atrial Fibrillation  ?  ?  ?  ?History of Present Illness: ?Lisa [name] is a 81 y.o. female who presents for evaluation of palpitations.  She is seen by Dr. [name].  She has a history of of atrial fibrillation s/p DCCV in 07/2018 .  She has a history of syncope seen at  [facility] Jan 2023.  She saw Dr. [name] and a 2-week monitor.  He delivered to her house.  She also had a hypertensive urgency and renal Dopplers have been ordered. ?  ?She was seen today for a nurse visit and was noted to be in atrial fibrillation on EKG. she has had some nausea and vomiting and headache.  She is actually seeing a GI doctor.  She is however felt worse just in Lisa last few days since Lisa 28th.  She states she is feeling dizzy.  She is felt a fluttering in her chest.  She had to sit up.  It felt similar to her previous fibrillation.  It kind of comes and goes a little bit but she really thinks it could have been a persistent fibrillation just maybe not as fast at times.  It is rate controlled today when she was seen.   ?  ?She has a little bit of chest burning.  She feels somewhat lightheaded.  She just feels weak and fatigued. ?  ?  ?    ?Past Medical History:  ?Diagnosis Date  ? Bundle branch block, left    ? Dyslipidemia    ? GERD (gastroesophageal reflux disease)    ? Hypercholesteremia    ? Hypertension    ? LBBB (left bundle branch block)    ? UTI (urinary tract infection)    ?  ?  ?     ?Past Surgical History:  ?Procedure Laterality Date  ? bladder polyp removal      ? BREAST EXCISIONAL BIOPSY Right    ?  60 yrs ago- Benign  ? BREAST LUMPECTOMY Left    ?  No Visible Scar, said 30 + years ago in situ, no chemo, no radiation, or hormone replacement    ? CARDIOVERSION N/A  07/30/2018  ?  Procedure: CARDIOVERSION;  Surgeon: Jerline Pain, MD;  Location: [facility] ENDOSCOPY;  Service: Cardiovascular;  Laterality: N/A;  ? CESAREAN SECTION      ?  ?  ?  ?      ?Current Outpatient Medications  ?Medication Sig Dispense Refill  ? apixaban (ELIQUIS) 5 MG TABS tablet Take 1 tablet (5 mg total) by mouth 2 (two) times daily. 60 tablet 5  ? calcium carbonate (TUMS - DOSED IN MG ELEMENTAL CALCIUM) 500 MG chewable tablet Chew 1 tablet by mouth daily as needed for indigestion or heartburn.      ? Cholecalciferol (VITAMIN D3) 50 MCG (2000 UT) TABS Take 1 tablet by mouth daily.      ? hydrochlorothiazide (HYDRODIURIL) 25 MG tablet daily. Take 1tablet daily 90 tablet 3  ? losartan (COZAAR) 100 MG tablet TAKE 1 TABLET BY MOUTH EVERY DAY 90 tablet 3  ? metoprolol tartrate (LOPRESSOR) 25 MG tablet Take 1 tablet (25 mg total) by mouth  2 (two) times daily. ([name] taking differently: Take 25 mg by mouth 2 (two) times daily. Take 1 full tablet in Lisa am and 1/2 tablet at night) 180 tablet 3  ? pantoprazole (PROTONIX) 40 MG tablet Take 40 mg by mouth daily as needed for heartburn.   4  ? simvastatin (ZOCOR) 40 MG tablet Take 40 mg by mouth daily.      ?  ?No current facility-administered medications for this visit.  ?  ?  ?Allergies:   Codeine, Amlodipine, Fosamax [alendronate sodium], Lisinopril, and Olmesartan  ?  ?  ?ROS:  Please see Lisa history of present illness.   Otherwise, review of systems are positive for none.   All other systems are reviewed and negative.  ?  ?  ?PHYSICAL EXAM: ?VS:  BP (!) 148/78   Pulse 71   Ht '5\' 5"'$  (1.651 m)   Wt 166 lb (75.3 kg)   SpO2 98%   BMI 27.62 kg/m?  , BMI Body mass index is 27.62 kg/m?. ?GENERAL:  Well appearing ?HEENT:  Pupils equal round and reactive, fundi not visualized, oral mucosa unremarkable ?NECK:  No jugular venous distention, waveform within normal limits, carotid upstroke brisk and symmetric, no bruits, no thyromegaly ?LYMPHATICS:  No cervical, inguinal  adenopathy ?LUNGS:  Clear to auscultation bilaterally ?BACK:  No CVA tenderness ?CHEST:  Unremarkable ?HEART:  PMI not displaced or sustained,S1 and S2 within normal limits, no S3, no clicks, no rubs, no murmurs, irregular ?ABD:  Flat, positive bowel sounds normal in frequency in pitch, no bruits, no rebound, no guarding, no midline pulsatile mass, no hepatomegaly, no splenomegaly ?EXT:  2 plus pulses throughout, no edema, no cyanosis no clubbing ?SKIN:  No rashes no nodules ?NEURO:  Cranial nerves II through XII grossly intact, motor grossly intact throughout ?PSYCH:  Cognitively intact, oriented to person place and time ?  ?  ?  ?EKG:  EKG is ordered today. ?Lisa ekg ordered today demonstrates atrial fibrillation, left bundle branch block, left axis deviation, no acute ST-T wave changes. ?  ?  ?Recent Labs: ?No results found for requested labs within last 8760 hours.  ?  ?  ?Lipid Panel ?Labs (Brief)  ?No results found for: CHOL, TRIG, HDL, CHOLHDL, VLDL, LDLCALC, LDLDIRECT  ? ?  ?  ?   ?Wt Readings from Last 3 Encounters:  ?11/27/21 166 lb (75.3 kg)  ?11/27/21 166 lb (75.3 kg)  ?11/02/21 166 lb (75.3 kg)  ?  ?  ?  ?Other studies Reviewed: ?Additional studies/ records that were reviewed today include: [facility] records, labs. ?Review of Lisa above records demonstrates:  Please see elsewhere in Lisa note.   ?  ?  ?ASSESSMENT AND PLAN: ?  ?Atrial fibrillation:   She needs another cardioversion.   Lisa [name] has a CHA2DS2 - VASc score of 4.   I think this has been persistent since Lisa 28th.  I would like to schedule that this week because she is symptomatic.  She has not missed a dose of her Eliquis.  She will get labs today to include a TSH.  I will order an echocardiogram.  She will wear her husband's watch just to see if there is any sinus rhythm noted between now and Lisa time of Lisa cardioversion but I suspect again that this is persistent.  Afterwards she can talk to Dr. [name] about medications or even  ablation although this is only Lisa second episode since Lisa 2019 cardioversion. ?  ?HTN: Renal Dopplers  have been ordered.  Her blood pressure is mildly elevated but she can keep a blood pressure diary. ?  ?Syncope: She has had no further syncope.  Monitor is pending. ?  ?Current medicines are reviewed at length with Lisa [name] today.  Lisa [name] does not have concerns regarding medicines. ?  ?Lisa following changes have been made:  no change ?  ?Labs/ tests ordered today include:  ?  ?   ?Orders Placed This Encounter  ?Procedures  ? TSH  ? CBC with Differential/Platelet  ? Basic metabolic panel  ? ECHOCARDIOGRAM COMPLETE  ?  ?  ?  ?Disposition:   FU with Dr. [name] after Lisa cardioversion ?  ?  ?Signed, ?Minus Breeding, MD  ?11/27/2021 1:02 PM    ?[place] ? ?EP Attending ? ?[name] seen and examined. Agree with Lisa findings as noted above. I have reviewed Lisa indications/risks/benefits/ of DCCV and she is willing to proceed. ? ? ?Carleene Overlie Mikailah Morel,MD ?

## 2021-11-29 MED FILL — Midazolam HCl Inj 5 MG/5ML (Base Equivalent): INTRAMUSCULAR | Qty: 5 | Status: AC

## 2021-11-30 ENCOUNTER — Ambulatory Visit (HOSPITAL_BASED_OUTPATIENT_CLINIC_OR_DEPARTMENT_OTHER): Payer: Medicare Other

## 2021-11-30 ENCOUNTER — Other Ambulatory Visit: Payer: Self-pay

## 2021-11-30 ENCOUNTER — Other Ambulatory Visit: Payer: Self-pay | Admitting: Cardiology

## 2021-11-30 DIAGNOSIS — Z79899 Other long term (current) drug therapy: Secondary | ICD-10-CM | POA: Diagnosis not present

## 2021-11-30 DIAGNOSIS — K219 Gastro-esophageal reflux disease without esophagitis: Secondary | ICD-10-CM | POA: Diagnosis not present

## 2021-11-30 DIAGNOSIS — Z823 Family history of stroke: Secondary | ICD-10-CM | POA: Diagnosis not present

## 2021-11-30 DIAGNOSIS — E7800 Pure hypercholesterolemia, unspecified: Secondary | ICD-10-CM | POA: Diagnosis not present

## 2021-11-30 DIAGNOSIS — I4891 Unspecified atrial fibrillation: Secondary | ICD-10-CM

## 2021-11-30 DIAGNOSIS — I110 Hypertensive heart disease with heart failure: Secondary | ICD-10-CM | POA: Diagnosis not present

## 2021-11-30 DIAGNOSIS — I517 Cardiomegaly: Secondary | ICD-10-CM | POA: Diagnosis not present

## 2021-11-30 DIAGNOSIS — R001 Bradycardia, unspecified: Secondary | ICD-10-CM | POA: Diagnosis not present

## 2021-11-30 DIAGNOSIS — Z20822 Contact with and (suspected) exposure to covid-19: Secondary | ICD-10-CM | POA: Diagnosis not present

## 2021-11-30 DIAGNOSIS — I429 Cardiomyopathy, unspecified: Secondary | ICD-10-CM | POA: Diagnosis not present

## 2021-11-30 DIAGNOSIS — Z888 Allergy status to other drugs, medicaments and biological substances status: Secondary | ICD-10-CM | POA: Diagnosis not present

## 2021-11-30 DIAGNOSIS — I700 Atherosclerosis of aorta: Secondary | ICD-10-CM | POA: Diagnosis not present

## 2021-11-30 DIAGNOSIS — R42 Dizziness and giddiness: Secondary | ICD-10-CM | POA: Diagnosis not present

## 2021-11-30 DIAGNOSIS — Z950 Presence of cardiac pacemaker: Secondary | ICD-10-CM | POA: Diagnosis not present

## 2021-11-30 DIAGNOSIS — Z885 Allergy status to narcotic agent status: Secondary | ICD-10-CM | POA: Diagnosis not present

## 2021-11-30 DIAGNOSIS — R519 Headache, unspecified: Secondary | ICD-10-CM | POA: Diagnosis not present

## 2021-11-30 DIAGNOSIS — R110 Nausea: Secondary | ICD-10-CM | POA: Diagnosis not present

## 2021-11-30 DIAGNOSIS — I495 Sick sinus syndrome: Secondary | ICD-10-CM | POA: Diagnosis not present

## 2021-11-30 DIAGNOSIS — I10 Essential (primary) hypertension: Secondary | ICD-10-CM | POA: Diagnosis not present

## 2021-11-30 DIAGNOSIS — I447 Left bundle-branch block, unspecified: Secondary | ICD-10-CM | POA: Diagnosis not present

## 2021-11-30 DIAGNOSIS — Z7901 Long term (current) use of anticoagulants: Secondary | ICD-10-CM | POA: Diagnosis not present

## 2021-11-30 DIAGNOSIS — Z87891 Personal history of nicotine dependence: Secondary | ICD-10-CM | POA: Diagnosis not present

## 2021-11-30 DIAGNOSIS — I442 Atrioventricular block, complete: Secondary | ICD-10-CM | POA: Diagnosis not present

## 2021-11-30 DIAGNOSIS — R51 Headache with orthostatic component, not elsewhere classified: Secondary | ICD-10-CM | POA: Diagnosis not present

## 2021-11-30 DIAGNOSIS — I5032 Chronic diastolic (congestive) heart failure: Secondary | ICD-10-CM | POA: Diagnosis not present

## 2021-11-30 DIAGNOSIS — I5031 Acute diastolic (congestive) heart failure: Secondary | ICD-10-CM | POA: Diagnosis not present

## 2021-11-30 DIAGNOSIS — I480 Paroxysmal atrial fibrillation: Secondary | ICD-10-CM | POA: Diagnosis not present

## 2021-11-30 DIAGNOSIS — Z833 Family history of diabetes mellitus: Secondary | ICD-10-CM | POA: Diagnosis not present

## 2021-11-30 DIAGNOSIS — I4819 Other persistent atrial fibrillation: Secondary | ICD-10-CM | POA: Diagnosis not present

## 2021-11-30 DIAGNOSIS — G4489 Other headache syndrome: Secondary | ICD-10-CM | POA: Diagnosis not present

## 2021-11-30 DIAGNOSIS — R0902 Hypoxemia: Secondary | ICD-10-CM | POA: Diagnosis not present

## 2021-11-30 DIAGNOSIS — R55 Syncope and collapse: Secondary | ICD-10-CM | POA: Diagnosis not present

## 2021-11-30 LAB — ECHOCARDIOGRAM COMPLETE
Area-P 1/2: 5.62 cm2
S' Lateral: 3.2 cm

## 2021-11-30 NOTE — Telephone Encounter (Signed)
Prescription refill request for Eliquis received. ?Indication:Afib ?Last office visit:3/23 ?Scr:0.8 ?Age: 81 ?Weight:74.8 kg ? ?Prescription refilled ? ?

## 2021-12-01 DIAGNOSIS — I447 Left bundle-branch block, unspecified: Secondary | ICD-10-CM | POA: Diagnosis not present

## 2021-12-01 DIAGNOSIS — I480 Paroxysmal atrial fibrillation: Secondary | ICD-10-CM | POA: Diagnosis not present

## 2021-12-02 ENCOUNTER — Other Ambulatory Visit: Payer: Self-pay

## 2021-12-02 ENCOUNTER — Inpatient Hospital Stay (HOSPITAL_COMMUNITY)
Admission: EM | Admit: 2021-12-02 | Discharge: 2021-12-05 | DRG: 243 | Disposition: A | Discharge status: Home / Self Care | Payer: Medicare Other | Attending: Internal Medicine | Admitting: Internal Medicine

## 2021-12-02 ENCOUNTER — Emergency Department (HOSPITAL_COMMUNITY): Payer: Medicare Other

## 2021-12-02 DIAGNOSIS — K219 Gastro-esophageal reflux disease without esophagitis: Secondary | ICD-10-CM | POA: Diagnosis present

## 2021-12-02 DIAGNOSIS — R001 Bradycardia, unspecified: Secondary | ICD-10-CM | POA: Diagnosis not present

## 2021-12-02 DIAGNOSIS — Z950 Presence of cardiac pacemaker: Secondary | ICD-10-CM

## 2021-12-02 DIAGNOSIS — I5031 Acute diastolic (congestive) heart failure: Secondary | ICD-10-CM | POA: Diagnosis not present

## 2021-12-02 DIAGNOSIS — Z823 Family history of stroke: Secondary | ICD-10-CM | POA: Diagnosis not present

## 2021-12-02 DIAGNOSIS — R55 Syncope and collapse: Secondary | ICD-10-CM

## 2021-12-02 DIAGNOSIS — Z833 Family history of diabetes mellitus: Secondary | ICD-10-CM

## 2021-12-02 DIAGNOSIS — R519 Headache, unspecified: Secondary | ICD-10-CM | POA: Diagnosis present

## 2021-12-02 DIAGNOSIS — Z20822 Contact with and (suspected) exposure to covid-19: Secondary | ICD-10-CM | POA: Diagnosis present

## 2021-12-02 DIAGNOSIS — I110 Hypertensive heart disease with heart failure: Secondary | ICD-10-CM | POA: Diagnosis present

## 2021-12-02 DIAGNOSIS — I447 Left bundle-branch block, unspecified: Secondary | ICD-10-CM | POA: Diagnosis not present

## 2021-12-02 DIAGNOSIS — I429 Cardiomyopathy, unspecified: Secondary | ICD-10-CM | POA: Diagnosis not present

## 2021-12-02 DIAGNOSIS — Z885 Allergy status to narcotic agent status: Secondary | ICD-10-CM | POA: Diagnosis not present

## 2021-12-02 DIAGNOSIS — Z87891 Personal history of nicotine dependence: Secondary | ICD-10-CM

## 2021-12-02 DIAGNOSIS — E7800 Pure hypercholesterolemia, unspecified: Secondary | ICD-10-CM | POA: Diagnosis present

## 2021-12-02 DIAGNOSIS — Z7901 Long term (current) use of anticoagulants: Secondary | ICD-10-CM | POA: Diagnosis not present

## 2021-12-02 DIAGNOSIS — I442 Atrioventricular block, complete: Principal | ICD-10-CM | POA: Diagnosis present

## 2021-12-02 DIAGNOSIS — Z79899 Other long term (current) drug therapy: Secondary | ICD-10-CM | POA: Diagnosis not present

## 2021-12-02 DIAGNOSIS — I495 Sick sinus syndrome: Secondary | ICD-10-CM | POA: Diagnosis present

## 2021-12-02 DIAGNOSIS — I4819 Other persistent atrial fibrillation: Secondary | ICD-10-CM | POA: Diagnosis present

## 2021-12-02 DIAGNOSIS — R110 Nausea: Secondary | ICD-10-CM | POA: Diagnosis present

## 2021-12-02 DIAGNOSIS — Z888 Allergy status to other drugs, medicaments and biological substances status: Secondary | ICD-10-CM | POA: Diagnosis not present

## 2021-12-02 DIAGNOSIS — I5032 Chronic diastolic (congestive) heart failure: Secondary | ICD-10-CM | POA: Diagnosis present

## 2021-12-02 DIAGNOSIS — I10 Essential (primary) hypertension: Secondary | ICD-10-CM | POA: Diagnosis not present

## 2021-12-02 DIAGNOSIS — R51 Headache with orthostatic component, not elsewhere classified: Secondary | ICD-10-CM | POA: Diagnosis not present

## 2021-12-02 LAB — CBC WITH DIFFERENTIAL/PLATELET
Abs Immature Granulocytes: 0.02 10*3/uL (ref 0.00–0.07)
Basophils Absolute: 0.1 10*3/uL (ref 0.0–0.1)
Basophils Relative: 1 %
Eosinophils Absolute: 0.1 10*3/uL (ref 0.0–0.5)
Eosinophils Relative: 2 %
HCT: 35.4 % — ABNORMAL LOW (ref 36.0–46.0)
Hemoglobin: 11.7 g/dL — ABNORMAL LOW (ref 12.0–15.0)
Immature Granulocytes: 0 %
Lymphocytes Relative: 19 %
Lymphs Abs: 1.5 10*3/uL (ref 0.7–4.0)
MCH: 27.1 pg (ref 26.0–34.0)
MCHC: 33.1 g/dL (ref 30.0–36.0)
MCV: 81.9 fL (ref 80.0–100.0)
Monocytes Absolute: 0.4 10*3/uL (ref 0.1–1.0)
Monocytes Relative: 5 %
Neutro Abs: 5.8 10*3/uL (ref 1.7–7.7)
Neutrophils Relative %: 73 %
Platelets: 255 10*3/uL (ref 150–400)
RBC: 4.32 MIL/uL (ref 3.87–5.11)
RDW: 14.2 % (ref 11.5–15.5)
WBC: 7.9 10*3/uL (ref 4.0–10.5)
nRBC: 0 % (ref 0.0–0.2)

## 2021-12-02 LAB — RESP PANEL BY RT-PCR (FLU A&B, COVID) ARPGX2
Influenza A by PCR: NEGATIVE
Influenza B by PCR: NEGATIVE
SARS Coronavirus 2 by RT PCR: NEGATIVE

## 2021-12-02 LAB — BASIC METABOLIC PANEL
Anion gap: 9 (ref 5–15)
BUN: 19 mg/dL (ref 8–23)
CO2: 23 mmol/L (ref 22–32)
Calcium: 9.8 mg/dL (ref 8.9–10.3)
Chloride: 104 mmol/L (ref 98–111)
Creatinine, Ser: 1.04 mg/dL — ABNORMAL HIGH (ref 0.44–1.00)
GFR, Estimated: 55 mL/min — ABNORMAL LOW (ref >60)
Glucose, Bld: 112 mg/dL — ABNORMAL HIGH (ref 70–99)
Potassium: 3.6 mmol/L (ref 3.5–5.1)
Sodium: 136 mmol/L (ref 135–145)

## 2021-12-02 LAB — MAGNESIUM: Magnesium: 1.7 mg/dL (ref 1.7–2.4)

## 2021-12-02 LAB — TSH: TSH: 2.48 u[IU]/mL (ref 0.350–4.500)

## 2021-12-02 MED ORDER — SODIUM CHLORIDE 0.9 % IV SOLN: INTRAVENOUS | Status: DC

## 2021-12-02 MED ORDER — SODIUM CHLORIDE 0.9 % IV SOLN
80.0000 mg | INTRAVENOUS | Status: AC
  Administered 2021-12-04: 80 mg
  Filled 2021-12-02: qty 2

## 2021-12-02 MED ORDER — FUROSEMIDE 10 MG/ML IJ SOLN: 40.0000 mg | Freq: Once | INTRAMUSCULAR | Status: DC

## 2021-12-02 MED ORDER — CEFAZOLIN SODIUM-DEXTROSE 2-4 GM/100ML-% IV SOLN
2.0000 g | INTRAVENOUS | Status: AC
  Administered 2021-12-04: 2 g via INTRAVENOUS

## 2021-12-02 NOTE — ED Provider Notes (Signed)
[facility] EMERGENCY DEPARTMENT Provider Note   CSN: [id number] Arrival date & time: 12/02/21  1611     History  Chief Complaint  Patient presents with   Near Syncope   Bradycardia    Lisa [name] is a 81 y.o. female.  Lisa [name] is a 81 y.o. female with history of paroxysmal A-fib, left bundle branch block, hypertension, hyperlipidemia and GERD, who presents to the emergency department via EMS for evaluation of bradycardia and near syncope.  Patient and husband report that she started feeling very lightheaded and like she was going to pass out, so husband had her lay down and then he checked her blood pressure and heart rate, blood pressure was elevated with a systolic in the 295J but heart rate was in the 30s and she was continuing to feel very lightheaded, sweaty and nauseous and reports feeling as though she was about to pass out.  When EMS arrived heart rate was in the 30s and then dropped into the 20s.  They had reported sinus bradycardia on initial twelve-lead, and reports they gave the patient 2 mg of atropine after the first milligram patient had no change but after the second her heart rate went up to the 100s and then settled out in the 70s.  Patient reports after atropine her symptoms seem to resolve and she currently denies any lightheadedness or nausea.  Patient had a cardioversion done for A-fib by Dr. Lovena Le on Tuesday and is currently wearing a Zio patch.  Also had an echo done on Thursday.  Is typically followed by Dr. [name] with cardiology and most recently saw Dr. Percival Spanish.  The history is provided by the patient, the spouse and the EMS personnel.      Home Medications Prior to Admission medications   Medication Sig Start Date End Date Taking? Authorizing Provider  calcium carbonate (TUMS - DOSED IN MG ELEMENTAL CALCIUM) 500 MG chewable tablet Chew 1-2 tablets by mouth 2 (two) times daily as needed for indigestion or heartburn.    [provider]  Cholecalciferol (VITAMIN D3) 50 MCG (2000 UT) TABS Take 2,000 Units by mouth in the morning.    [provider]  ELIQUIS 5 MG TABS tablet TAKE 1 TABLET BY MOUTH TWICE A DAY 11/30/21   [name], Peter M, MD  hydrochlorothiazide (HYDRODIURIL) 25 MG tablet Take 25 mg by mouth in the morning. Take 1tablet daily 11/10/21   [name], Peter M, MD  losartan (COZAAR) 100 MG tablet TAKE 1 TABLET BY MOUTH EVERY DAY 08/23/21   [name], Peter M, MD  metoprolol tartrate (LOPRESSOR) 25 MG tablet Take 1 tablet (25 mg total) by mouth 2 (two) times daily. Patient taking differently: Take 25 mg by mouth 2 (two) times daily. Take 1 tablet (25 mg) by mouth in the morning & take 0.5 tablet (12.5 mg) by mouth in the evening. 11/15/21   [name], Peter M, MD  pantoprazole (PROTONIX) 40 MG tablet Take 40 mg by mouth daily as needed (gerd/heartburn). 07/04/18   [provider]  Polyethyl Glycol-Propyl Glycol (SYSTANE) 0.4-0.3 % SOLN Place 1-2 drops into both eyes 3 (three) times daily as needed (dry/irritated eyes.).    [provider]  simvastatin (ZOCOR) 40 MG tablet Take 40 mg by mouth every evening. 08/13/17   [provider]      Allergies    Codeine, Amlodipine, Fosamax [alendronate sodium], Lisinopril, and Olmesartan    Review of Systems   Review of Systems  Constitutional:  Negative for chills and fever.  HENT: Negative.    Respiratory:  Negative for cough and shortness of breath.   Cardiovascular:  Negative for chest pain, palpitations and leg swelling.  Gastrointestinal:  Positive for nausea. Negative for abdominal pain, diarrhea and vomiting.  Genitourinary:  Negative for dysuria and frequency.  Musculoskeletal:  Negative for arthralgias and myalgias.  Neurological:  Positive for weakness (Generalized), light-headedness and headaches. Negative for dizziness, syncope and numbness.  All other systems reviewed and are negative.  Physical Exam Updated Vital Signs BP  (!) 160/81    Pulse 74    Temp 98 F (36.7 C) (Oral)    Resp 16    Ht '5\' 5"'$  (1.651 m)    Wt 74.8 kg    SpO2 95%    BMI 27.46 kg/m  Physical Exam Vitals and nursing note reviewed.  Constitutional:      General: She is not in acute distress.    Appearance: Normal appearance. She is well-developed. She is not ill-appearing or diaphoretic.     Comments: Female, alert, in no acute distress  HENT:     Head: Normocephalic and atraumatic.     Mouth/Throat:     Mouth: Mucous membranes are moist.     Pharynx: Oropharynx is clear.  Eyes:     General:        Right eye: No discharge.        Left eye: No discharge.     Extraocular Movements: Extraocular movements intact.     Pupils: Pupils are equal, round, and reactive to light.  Cardiovascular:     Rate and Rhythm: Normal rate and regular rhythm.     Pulses: Normal pulses.     Heart sounds: Normal heart sounds. No murmur heard.   No friction rub. No gallop.  Pulmonary:     Effort: Pulmonary effort is normal. No respiratory distress.     Breath sounds: Normal breath sounds. No wheezing or rales.     Comments: Respirations equal and unlabored, patient able to speak in full sentences, lungs clear to auscultation bilaterally  Abdominal:     General: Bowel sounds are normal. There is no distension.     Palpations: Abdomen is soft. There is no mass.     Tenderness: There is no abdominal tenderness. There is no guarding.     Comments: Abdomen soft, nondistended, nontender to palpation in all quadrants without guarding or peritoneal signs  Musculoskeletal:        General: No deformity.     Cervical back: Neck supple.     Right lower leg: No edema.     Left lower leg: No edema.  Skin:    General: Skin is warm and dry.     Capillary Refill: Capillary refill takes less than 2 seconds.  Neurological:     Mental Status: She is alert and oriented to person, place, and time.     Coordination: Coordination normal.     Comments: Speech is clear, able  to follow commands CN III-XII intact Normal strength in upper and lower extremities bilaterally including dorsiflexion and plantar flexion, strong and equal grip strength Sensation normal to light and sharp touch Moves extremities without ataxia, coordination intact  Psychiatric:        Mood and Affect: Mood normal.        Behavior: Behavior normal.    ED Results / Procedures / Treatments   Labs (all labs ordered are listed, but only abnormal results are displayed) Labs  Reviewed  BASIC METABOLIC PANEL - Abnormal; Notable for the following components:      Result Value   Glucose, Bld 112 (*)    Creatinine, Ser 1.04 (*)    GFR, Estimated 55 (*)    All other components within normal limits  CBC WITH DIFFERENTIAL/PLATELET - Abnormal; Notable for the following components:   Hemoglobin 11.7 (*)    HCT 35.4 (*)    All other components within normal limits  RESP PANEL BY RT-PCR (FLU A&B, COVID) ARPGX2  MAGNESIUM  TSH    EKG None  Radiology DG Chest Port 1 View  Result Date: 12/02/2021 CLINICAL DATA:  Syncope EXAM: PORTABLE CHEST 1 VIEW COMPARISON:  05/29/2018 FINDINGS: Heart size is upper limits of normal. Aortic atherosclerosis. Chronic interstitial thickening. No focal airspace consolidation. No large pleural fluid collection. No pneumothorax. Hydroxyapatite deposition in the right rotator cuff. IMPRESSION: 1. Chronic interstitial thickening without evidence for an acute cardiopulmonary process. 2. 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 Electronically Signed   By: Davina Poke D.O.   On: 12/02/2021 16:48    Procedures .Critical Care Performed by: Jacqlyn Larsen, PA-C Authorized by: Jacqlyn Larsen, PA-C   Critical care provider statement:    Critical care time (minutes):  45   Critical care was necessary to treat or prevent imminent or life-threatening deterioration of the following conditions:  Cardiac failure (Intermittent complete heart block)   Critical care was  time spent personally by me on the following activities:  Development of treatment plan with patient or surrogate, discussions with consultants, evaluation of patient's response to treatment, examination of patient, ordering and review of laboratory studies, ordering and review of radiographic studies, ordering and performing treatments and interventions, pulse oximetry, re-evaluation of patient's condition and review of old charts    Medications Ordered in ED Medications - No data to display  ED Course/ Medical Decision Making/ A&P  This patient presents to the ED for concern of bradycardia, near syncope, this involves an extensive number of treatment options, and is a complaint that carries with it a high risk of complications and morbidity.  The differential diagnosis includes third-degree heart block or other arrhythmia, A-fib, electrolyte derangement, dehydration   Co morbidities that complicate the patient evaluation  A-fib, left bundle branch block, hypertension, hyperlipidemia   Additional history obtained:  Additional history obtained from EMS, husband at bedside External records from outside source obtained and reviewed including recent cardiology visits   Lab Tests:  I Ordered, and personally interpreted labs.  The pertinent results include: No leukocytosis and stable hemoglobin, glucose 112, no other significant electrolyte derangements, creatinine slightly increased from baseline at 1.04, typically 0.8.  Normal TSH and magnesium levels and negative COVID and flu.   Imaging Studies ordered:  I ordered imaging studies including chest x-ray I independently visualized and interpreted imaging which showed chronic interstitial thickening, but no acute cardiopulmonary disease I agree with the radiologist interpretation   Cardiac Monitoring:  The patient was maintained on a cardiac monitor.  I personally viewed and interpreted the cardiac monitored which showed an underlying  rhythm of: Sinus rhythm with left bundle branch block, heart rate in the 60s/70s Review of initial EKG done by EMS is suggestive of third-degree heart block which it seems resolved after patient received atropine   Medicines ordered and prescription drug management:  I have reviewed the patients home medicines   Critical Interventions:  Cardiac monitoring for recurrent complete heart block, patient remains on pacer pads  in case she has a recurrence with significant bradycardia.  Currently maintaining heart rates we will hold off on any additional atropine   Consultations Obtained:  I requested consultation with the cardiologist, Dr. Caryl Comes,  and discussed lab and imaging findings as well as pertinent plan -they will admit the patient and plan for pacemaker on Monday for intermittent complete heart block   Problem List / ED Course:  Patient arrives after she had witnessed near syncope with profound bradycardia with heart rate in the 20s-30s with EMS, responded to 2 mg of atropine and on arrival heart rate maintaining in the 70s and symptoms have resolved.  No associated hypotension.  Did not fall to the ground. Initial EKG with EMS is consistent with complete heart block, based on additional rhythm strips with EMS it appears that this resolved after 2 mg of atropine were administered.  Patient will likely need pacemaker will consult cardiology Lab work reassuring    Disposition:  Admission to cardiology service with plans for pacemaker on Monday.         Final Clinical Impression(s) / ED Diagnoses Final diagnoses:  Intermittent complete heart block [facility])  Near syncope    Rx / DC Orders ED Discharge Orders     None         Jacqlyn Larsen, [place] 12/02/21 1821    Hayden Rasmussen, MD 12/03/21 1035

## 2021-12-02 NOTE — ED Notes (Signed)
Cardiology at bedside.

## 2021-12-02 NOTE — ED Triage Notes (Signed)
Pt to ED via EMS from home c/o near syncopal episode x2. Pt was in the kitchen, felt dizzy, and husband assisted to the couch. No LOC, No fall.Pt able to recall events.  HX: AFIB- Cardioverted Tuesday.  On EMS arrival pt A&0X4. , HR noted to be in 30s; '2MG'$  Atropine , 413m fluid bolus given by EMS. Last VS: HR 80, 137/88, 97%2L. 20RR. Pt has been having intermittent near syncopal episodes since January. #[id number] ?

## 2021-12-02 NOTE — Progress Notes (Signed)
eLink Physician-Brief Progress Note ?Patient Name: Lisa [name] ?DOB: May 12, 1941 ?MRN: [id number] ? ? ?Date of Service ? 12/02/2021  ?HPI/Events of Note ? 81 yr old woman with intermittent CHB. Admitted to ICU for pacemaker tomorrow. Cardiology primary. Stable on camera.   ?eICU Interventions ? Plan per cardiology.   ? ? ? ?Intervention Category ?Major Interventions: Arrhythmia - evaluation and management ?Evaluation Type: New Patient Evaluation ? ?Lisa [name] ?12/02/2021, 8:13 PM ?

## 2021-12-02 NOTE — H&P (Addendum)
Cardiology Admission History and Physical:   Patient ID: Lisa [name] MRN: [id number]; DOB: 1941-03-23   Admission date: 12/02/2021  PCP:  Kristen Loader, [facility] Providers Cardiologist:  Peter [name], MD        Chief Complaint: Syncope  Patient Profile:   Lisa [name] is a 81 y.o. female with PMH of paroxysmal atrial fibrillation s/p DCCV in 2019 and more recently 11/28/2021, syncope, HTN, HLD, LBBB, who is being seen 12/02/2021 for the evaluation of presyncope at the request of Benedetto Goad, PA-C.  History of Present Illness:   Lisa [name] was recently seen in our office by Dr. Percival Spanish 11/27/2021.  She initially presented for a pharmacy visit for management of her hypertension, however was found to be in atrial fibrillation prompting escalation of visit to Dr. Percival Spanish.  She reported having dizziness, palpitations, weakness, fatigue, and a burning sensation in her chest which she had experienced with previous episodes of atrial fibrillation, ongoing since 11/21/2021.  Given symptomology she was recommended to undergo repeat DCCV for suspected persistent atrial fibrillation.  Given compliance with her Eliquis this was arranged for 11/28/2021 with Dr. Lovena Le. She had a successful cardioversion which occurred without complications.  She underwent an outpatient echocardiogram 11/30/2021 which showed EF 60-65%, no RWMA, G3DD, elevated LVEDP, mild RV enlargement, moderate LAE, mild RAE, severe MAC without stenosis, and mild aortic dilation (37 mm).  She was in her usual state of health this morning  she began feeling lightheaded and presyncopal..  She immediately laid down and her husband took her blood pressure noting SBP in the 160s and heart rate in the 30s.  Felt better, sat up with recurrent symptoms.  She had associated nausea, diaphoresis and presyncope prompting EMS activation.  On EMS arrival she was noted to have heart rate in the 30s, then drop down to the 20s for which she was  given 1 mg of atropine followed by an additional 2 mg of atropine with improvement of heart rate to the 100s after the second dose.  Atropine was associated with an increase in the sinus rate.  Heart rate settled into the 70s prior to transfer to [facility].  On arrival she feels back to baseline.    Has had some dyspnea on exertion which has been relatively stable.  But orthopnea the last few nights.  Some edema a month or so ago but none since.  No chest pain   She has had a.m. headaches for the last month.  Thromboembolic risk factors ( age  -2, HTN-1, Gender-1) for a CHADSVASc Score of >=4   Past Medical History:  Diagnosis Date   Bundle branch block, left    Dyslipidemia    GERD (gastroesophageal reflux disease)    Hypercholesteremia    Hypertension    LBBB (left bundle branch block)    UTI (urinary tract infection)     Past Surgical History:  Procedure Laterality Date   bladder polyp removal     BREAST EXCISIONAL BIOPSY Right    60 yrs ago- Benign   BREAST LUMPECTOMY Left    No Visible Scar, said 30 + years ago in situ, no chemo, no radiation, or hormone replacement     CARDIOVERSION N/A 07/30/2018   Procedure: CARDIOVERSION;  Surgeon: Jerline Pain, MD;  Location: [facility] ENDOSCOPY;  Service: Cardiovascular;  Laterality: N/A;   CARDIOVERSION N/A 11/28/2021   Procedure: CARDIOVERSION;  Surgeon: Evans Lance, MD;  Location: [facility] CV LAB;  Service: Cardiovascular;  Laterality: N/A;   CESAREAN SECTION       Medications Prior to Admission: Prior to Admission medications   Medication Sig Start Date End Date Taking? Authorizing Provider  calcium carbonate (TUMS - DOSED IN MG ELEMENTAL CALCIUM) 500 MG chewable tablet Chew 1-2 tablets by mouth 2 (two) times daily as needed for indigestion or heartburn.    [provider]  Cholecalciferol (VITAMIN D3) 50 MCG (2000 UT) TABS Take 2,000 Units by mouth in the morning.    [provider]  ELIQUIS 5 MG TABS  tablet TAKE 1 TABLET BY MOUTH TWICE A DAY 11/30/21   [name], Peter M, MD  hydrochlorothiazide (HYDRODIURIL) 25 MG tablet Take 25 mg by mouth in the morning. Take 1tablet daily 11/10/21   [name], Peter M, MD  losartan (COZAAR) 100 MG tablet TAKE 1 TABLET BY MOUTH EVERY DAY 08/23/21   [name], Peter M, MD  metoprolol tartrate (LOPRESSOR) 25 MG tablet Take 1 tablet (25 mg total) by mouth 2 (two) times daily. Patient taking differently: Take 25 mg by mouth 2 (two) times daily. Take 1 tablet (25 mg) by mouth in the morning & take 0.5 tablet (12.5 mg) by mouth in the evening. 11/15/21   [name], Peter M, MD  pantoprazole (PROTONIX) 40 MG tablet Take 40 mg by mouth daily as needed (gerd/heartburn). 07/04/18   [provider]  Polyethyl Glycol-Propyl Glycol (SYSTANE) 0.4-0.3 % SOLN Place 1-2 drops into both eyes 3 (three) times daily as needed (dry/irritated eyes.).    [provider]  simvastatin (ZOCOR) 40 MG tablet Take 40 mg by mouth every evening. 08/13/17   [provider]     Allergies:    Allergies  Allergen Reactions   Codeine Other (See Comments) and Nausea And Vomiting    GI upset Other reaction(s): stomach upset   Amlodipine Swelling     edema   Fosamax [Alendronate Sodium] Other (See Comments)    Tooth problems   Lisinopril Cough   Olmesartan Other (See Comments)    Possible photodermatitis     Social History:   Social History   Socioeconomic History   Marital status: Married    Spouse name: Not on file   Number of children: 2   Years of education: Not on file   Highest education level: Not on file  Occupational History   Not on file  Tobacco Use   Smoking status: Former    Years: 20.00    Types: Cigarettes    Quit date: 1993    Years since quitting: 30.2   Smokeless tobacco: Never  Vaping Use   Vaping Use: Never used  Substance and Sexual Activity   Alcohol use: Yes   Drug use: No   Sexual activity: Not on file  Other Topics Concern   Not  on file  Social History Narrative   Not on file   Social Determinants of Health   Financial Resource Strain: Not on file  Food Insecurity: Not on file  Transportation Needs: Not on file  Physical Activity: Not on file  Stress: Not on file  Social Connections: Not on file  Intimate Partner Violence: Not on file    Family History:    The patient's family history includes Breast cancer in her mother; CVA in her brother and brother; Crohn's disease in her brother; Diabetes in her brother; Hip fracture in her mother; Other in her brother; Renal cancer in her brother; Ulcers in her father.  ROS:  Please see the history of present illness.   All other ROS reviewed and negative.     Physical Exam/Data:   Vitals:   12/02/21 1621 12/02/21 1622  BP:  (!) 172/80  Pulse:  77  Resp:  19  Temp:  98 F (36.7 C)  TempSrc:  Oral  SpO2:  98%  Weight: 74.8 kg   Height: _0  (1.651 m)    No intake or output data in the 24 hours ending 12/02/21 1658 Last 3 Weights 12/02/2021 11/28/2021 11/27/2021  Weight (lbs) 165 lb 165 lb 166 lb  Weight (kg) 74.844 kg 74.844 kg 75.297 kg     Body mass index is 27.46 kg/m.  General:  Well nourished, well developed, in no acute distress  HEENT: normal Neck: no JVD Vascular: No carotid bruits; Distal pulses 2+ bilaterally   Cardiac:  normal S1, S2; RRR; no murmur   Lungs:  clear to auscultation bilaterally, no wheezing, rhonchi or rales  Abd: soft, nontender, no hepatomegaly  Ext: no  edema Musculoskeletal:  No deformities, BUE and BLE strength normal and equal Skin: warm and dry  Neuro:  CNs 2-12 intact, no focal abnormalities noted Psych:  Normal affect    ECGs were done by EMS and were reviewed Complete heart block some tracings with right bundle branch escape others with left; ultimately developed 2: 1 heart block with a left bundle branch block pattern.  Atropine was associated with an increase in the atrial rate from about 80-140.   Baseline  twelve-lead demonstrates sinus rhythm 85 Interval 17/16/44 Left bundle branch block  Relevant CV Studies: Echocardiogram 11/30/21: 1. Left ventricular ejection fraction, by estimation, is 60 to 65%. Left  ventricular ejection fraction by 3D volume is 66 %. The left ventricle has  normal function. The left ventricle has no regional wall motion  abnormalities. Left ventricular diastolic   parameters are consistent with Grade III diastolic dysfunction  (restrictive). Elevated left ventricular end-diastolic pressure.   2. Right ventricular systolic function is normal. The right ventricular  size is mildly enlarged. There is normal pulmonary artery systolic  pressure. The estimated right ventricular systolic pressure is 12.8 mmHg.   3. Left atrial size was moderately dilated.   4. Right atrial size was mildly dilated.   5. The mitral valve is normal in structure. No evidence of mitral valve  regurgitation. No evidence of mitral stenosis. Severe mitral annular  calcification.   6. The aortic valve is tricuspid. Aortic valve regurgitation is not  visualized. Aortic valve sclerosis/calcification is present, without any  evidence of aortic stenosis.   7. Aortic dilatation noted. There is borderline dilatation of the  ascending aorta, measuring 37 mm.   8. The inferior vena cava is normal in size with greater than 50%  respiratory variability, suggesting right atrial pressure of 3 mmHg.   DCCV 11/28/21: Conclusion: Successful DC cardioversion in a patient with persistent atrial fibrillation.  Laboratory Data:  High Sensitivity Troponin:  No results for input(s): TROPONINIHS in the last 720 hours.    Chemistry Recent Labs  Lab 11/27/21 1138  NA 143  K 4.6  CL 104  CO2 22  GLUCOSE 94  BUN 19  CREATININE 0.86  CALCIUM 10.1    No results for input(s): PROT, ALBUMIN, AST, ALT, ALKPHOS, BILITOT in the last 168 hours. Lipids No results for input(s): CHOL, TRIG, HDL, LABVLDL, LDLCALC,  CHOLHDL in the last 168 hours. Hematology Recent Labs  Lab 11/27/21 1138 12/02/21 1626  WBC 9.6 7.9  RBC 4.97 4.32  HGB 13.1 11.7*  HCT 40.7 35.4*  MCV 82 81.9  MCH 26.4* 27.1  MCHC 32.2 33.1  RDW 13.5 14.2  PLT 332 255   Thyroid  Recent Labs  Lab 11/27/21 1138  TSH 1.800   BNPNo results for input(s): BNP, PROBNP in the last 168 hours.  DDimer No results for input(s): DDIMER in the last 168 hours.   Radiology/Studies:  DG Chest Port 1 View  Result Date: 12/02/2021 CLINICAL DATA:  Syncope EXAM: PORTABLE CHEST 1 VIEW COMPARISON:  05/29/2018 FINDINGS: Heart size is upper limits of normal. Aortic atherosclerosis. Chronic interstitial thickening. No focal airspace consolidation. No large pleural fluid collection. No pneumothorax. Hydroxyapatite deposition in the right rotator cuff. IMPRESSION: 1. Chronic interstitial thickening without evidence for an acute cardiopulmonary process. 2. 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 Electronically Signed   By: Davina Poke D.O.   On: 12/02/2021 16:48     Assessment and Plan:   Presyncope Complete heart block-intermittent Left bundle branch block Congestive heart failure acute/diastolic Atrial fibrillation-persistent status post recent cardioversion Cardiomyopathy 2019 question rate related AM headaches  The patient has documented intermittent complete heart block in the context of left bundle branch block.  There is an indication for pacing for relief of symptoms. The benefits and risks were reviewed including but not limited to death,  perforation, infection, lead dislodgement and device malfunction.  The patient understands agrees and is willing to proceed.  She has persistent atrial fibrillation for which she just underwent cardioversion.  Hence, we will try to do the procedure holding just 2 doses of her anticoagulant the morning of and the night after her procedure.   She has had orthopnea the last couple of nights.   Not quite sure what has changed following her cardioversion; we will give her furosemide tonight and tomorrow morning.  Echocardiogram and demonstrated an elevated LVEDP with E/E' of almost 20  I am also concerned about her a.m. headaches, and I think she probably needs brain imaging.  Would defer until after her pacemaker.  Risk Assessment/Risk Scores:      Severity of Illness: The appropriate patient status for this patient is INPATIENT. Inpatient status is judged to be reasonable and necessary in order to provide the required intensity of service to ensure the patient's safety. The patient's presenting symptoms, physical exam findings, and initial radiographic and laboratory data in the context of their chronic comorbidities is felt to place them at high risk for further clinical deterioration. Furthermore, it is not anticipated that the patient will be medically stable for discharge from the hospital within 2 midnights of admission.   * I certify that at the point of admission it is my clinical judgment that the patient will require inpatient hospital care spanning beyond 2 midnights from the point of admission due to high intensity of service, high risk for further deterioration and high frequency of surveillance required.*   For questions or updates, please contact [place] Please consult [email or web address] for contact info under     Signed, Abigail Butts, PA-C  12/02/2021 4:58 PM

## 2021-12-03 DIAGNOSIS — R51 Headache with orthostatic component, not elsewhere classified: Secondary | ICD-10-CM

## 2021-12-03 DIAGNOSIS — R55 Syncope and collapse: Secondary | ICD-10-CM

## 2021-12-03 DIAGNOSIS — I10 Essential (primary) hypertension: Secondary | ICD-10-CM

## 2021-12-03 DIAGNOSIS — R110 Nausea: Secondary | ICD-10-CM

## 2021-12-03 DIAGNOSIS — I429 Cardiomyopathy, unspecified: Secondary | ICD-10-CM

## 2021-12-03 LAB — HEPATIC FUNCTION PANEL
ALT: 27 U/L (ref 0–44)
AST: 28 U/L (ref 15–41)
Albumin: 3.7 g/dL (ref 3.5–5.0)
Alkaline Phosphatase: 49 U/L (ref 38–126)
Bilirubin, Direct: <0.1 mg/dL (ref 0.0–0.2)
Total Bilirubin: 0.7 mg/dL (ref 0.3–1.2)
Total Protein: 6.6 g/dL (ref 6.5–8.1)

## 2021-12-03 LAB — LIPASE, BLOOD: Lipase: 33 U/L (ref 11–51)

## 2021-12-03 LAB — AMYLASE: Amylase: 34 U/L (ref 28–100)

## 2021-12-03 LAB — MAGNESIUM: Magnesium: 1.7 mg/dL (ref 1.7–2.4)

## 2021-12-03 LAB — SURGICAL PCR SCREEN
MRSA, PCR: NEGATIVE
Staphylococcus aureus: NEGATIVE

## 2021-12-03 MED ORDER — APIXABAN 5 MG PO TABS
5.0000 mg | ORAL_TABLET | Freq: Two times a day (BID) | ORAL | Status: AC
  Administered 2021-12-03: 5 mg via ORAL
  Filled 2021-12-03: qty 1

## 2021-12-03 MED ORDER — APIXABAN 5 MG PO TABS
5.0000 mg | ORAL_TABLET | Freq: Two times a day (BID) | ORAL | Status: DC
  Administered 2021-12-03: 5 mg via ORAL
  Filled 2021-12-03: qty 1

## 2021-12-03 MED ORDER — ACETAMINOPHEN 325 MG PO TABS
650.0000 mg | ORAL_TABLET | Freq: Four times a day (QID) | ORAL | Status: DC | PRN
  Administered 2021-12-03 – 2021-12-04 (×2): 650 mg via ORAL
  Filled 2021-12-03 (×2): qty 2

## 2021-12-03 MED ORDER — MAGNESIUM SULFATE 2 GM/50ML IV SOLN
2.0000 g | Freq: Once | INTRAVENOUS | Status: AC
  Administered 2021-12-03: 2 g via INTRAVENOUS
  Filled 2021-12-03: qty 50

## 2021-12-03 MED ORDER — ACETAMINOPHEN 650 MG RE SUPP: 650.0000 mg | Freq: Four times a day (QID) | RECTAL | Status: DC | PRN

## 2021-12-03 MED ORDER — HYDRALAZINE HCL 10 MG PO TABS
10.0000 mg | ORAL_TABLET | Freq: Three times a day (TID) | ORAL | Status: DC
  Administered 2021-12-03 – 2021-12-05 (×7): 10 mg via ORAL
  Filled 2021-12-03 (×7): qty 1

## 2021-12-03 MED ORDER — POTASSIUM CHLORIDE CRYS ER 20 MEQ PO TBCR
40.0000 meq | EXTENDED_RELEASE_TABLET | Freq: Once | ORAL | Status: AC
  Administered 2021-12-03: 40 meq via ORAL
  Filled 2021-12-03: qty 2

## 2021-12-03 MED ORDER — PANTOPRAZOLE SODIUM 40 MG PO TBEC: 40.0000 mg | DELAYED_RELEASE_TABLET | Freq: Every day | ORAL | Status: DC | PRN

## 2021-12-03 MED ORDER — SODIUM CHLORIDE 0.9% FLUSH
3.0000 mL | Freq: Two times a day (BID) | INTRAVENOUS | Status: DC
  Administered 2021-12-03 – 2021-12-05 (×3): 3 mL via INTRAVENOUS

## 2021-12-03 MED ORDER — SIMVASTATIN 20 MG PO TABS
40.0000 mg | ORAL_TABLET | Freq: Every evening | ORAL | Status: DC
  Administered 2021-12-03 – 2021-12-04 (×2): 40 mg via ORAL
  Filled 2021-12-03 (×2): qty 2

## 2021-12-03 MED ORDER — LOSARTAN POTASSIUM 50 MG PO TABS
100.0000 mg | ORAL_TABLET | Freq: Every day | ORAL | Status: DC
  Administered 2021-12-03 – 2021-12-05 (×3): 100 mg via ORAL
  Filled 2021-12-03 (×3): qty 2

## 2021-12-03 MED ORDER — CALCIUM CARBONATE ANTACID 500 MG PO CHEW: 1.0000 | CHEWABLE_TABLET | Freq: Two times a day (BID) | ORAL | Status: DC | PRN

## 2021-12-03 MED ORDER — ONDANSETRON HCL 4 MG PO TABS: 4.0000 mg | ORAL_TABLET | Freq: Four times a day (QID) | ORAL | Status: DC | PRN

## 2021-12-03 MED ORDER — CHLORHEXIDINE GLUCONATE CLOTH 2 % EX PADS
6.0000 | MEDICATED_PAD | Freq: Every day | CUTANEOUS | Status: DC
  Administered 2021-12-03 – 2021-12-04 (×2): 6 via TOPICAL

## 2021-12-03 MED ORDER — ONDANSETRON HCL 4 MG/2ML IJ SOLN
4.0000 mg | Freq: Four times a day (QID) | INTRAMUSCULAR | Status: DC | PRN
  Administered 2021-12-03 – 2021-12-04 (×2): 4 mg via INTRAVENOUS
  Filled 2021-12-03 (×2): qty 2

## 2021-12-03 NOTE — Plan of Care (Signed)

## 2021-12-03 NOTE — Progress Notes (Addendum)
? ? ? ? ? ?  Patient Name: Lisa [name] ?  ?  ?Patient Profile:  ? ?Lisa [name] is a 81 y.o. female with PMH of persistent  atrial fibrillation s/p DCCV  11/28/2021,  more recently  syncope, HTN, HLD, LBBB, seen 12/02/2021 for the evaluation of presyncope  with documented complete heart block ?Echo 3/23  normal LV function, Mod LAE  ?Nausea and AM headache have been present for about a month ?  ?SUBJECTIVE:nauseated and with head ache this am ? ?Past Medical History:  ?Diagnosis Date  ? Bundle branch block, left   ? Dyslipidemia   ? GERD (gastroesophageal reflux disease)   ? Hypercholesteremia   ? Hypertension   ? LBBB (left bundle branch block)   ? UTI (urinary tract infection)   ? ? ?Scheduled Meds: ? ?Scheduled Meds: ? apixaban  5 mg Oral BID  ? Chlorhexidine Gluconate Cloth  6 each Topical Daily  ? furosemide  40 mg Intravenous Once  ? gentamicin irrigation  80 mg Irrigation On Call  ? losartan  100 mg Oral Daily  ? simvastatin  40 mg Oral QPM  ? sodium chloride flush  3 mL Intravenous Q12H  ? ?Continuous Infusions: ? sodium chloride    ? sodium chloride    ?  ceFAZolin (ANCEF) IV    ? ?acetaminophen **OR** acetaminophen, calcium carbonate, ondansetron **OR** ondansetron (ZOFRAN) IV, pantoprazole ? ? ? ?PHYSICAL EXAM ?Vitals:  ? 12/03/21 0400 12/03/21 0500 12/03/21 0600 12/03/21 0700  ?BP: (!) 166/66 (!) 142/62 (!) 171/78   ?Pulse: (!) 57 (!) 57 65   ?Resp: '17 14 14   '$ ?Temp:    98.3 ?F (36.8 ?C)  ?TempSrc:    Oral  ?SpO2: 92% 95% 98%   ?Weight:      ?Height:      ? ?Well developed and nourished in no acute distress ?HENT normal ?Neck supple with JVP-  flat   ?Clear ?Regular rate and rhythm, no murmurs or gallops ?Abd-soft with active BS ?No Clubbing cyanosis edema ?Skin-warm and dry ?A & Oriented  Grossly normal sensory and motor function ?  ? ?TELEMETRY: Reviewed personnally pt in sinus without heart block: ? ? ?Intake/Output Summary (Last 24 hours) at 12/03/2021 0801 ?Last data filed at 12/03/2021 0600 ?Gross  per 24 hour  ?Intake 60 ml  ?Output 1250 ml  ?Net -1190 ml  ? ? ?LABS: ?Basic Metabolic Panel: ?Recent Labs  ?Lab 11/27/21 ?1138 12/02/21 ?1626  ?NA 143 136  ?K 4.6 3.6  ?CL 104 104  ?CO2 22 23  ?GLUCOSE 94 112*  ?BUN 19 19  ?CREATININE 0.86 1.04*  ?CALCIUM 10.1 9.8  ?MG  --  1.7  ? ?Cardiac Enzymes: ?No results for input(s): CKTOTAL, CKMB, CKMBINDEX, TROPONINI in the last 72 hours. ?CBC: ?Recent Labs  ?Lab 11/27/21 ?1138 12/02/21 ?1626  ?WBC 9.6 7.9  ?NEUTROABS 6.1 5.8  ?HGB 13.1 11.7*  ?HCT 40.7 35.4*  ?MCV 82 81.9  ?PLT 332 255  ? ? ? ?ASSESSMENT AND PLAN: ? ?Presyncope ?Complete heart block-intermittent ?Left bundle branch block ?Hypertension ?Congestive heart failure acute/diastolic ?Atrial fibrillation-persistent status post recent cardioversion ?Cardiomyopathy 2019 question rate related ?AM headaches and Nausea ? ?BP remains elevated this am, will add low dose hydral today and can consider dilt post pacing  ? ?For pacing in the am orders written ? ?AM headaches and nausea-- would get head CT post pacemaker ?Will check pancreas Gi labs ? ? ? ?Signed, ?Virl Axe MD ? ?12/03/2021 ? ?

## 2021-12-04 ENCOUNTER — Encounter (HOSPITAL_COMMUNITY)
Admission: EM | Disposition: A | Discharge status: Home / Self Care | Payer: Self-pay | Source: Home / Self Care | Attending: Internal Medicine

## 2021-12-04 DIAGNOSIS — R001 Bradycardia, unspecified: Secondary | ICD-10-CM

## 2021-12-04 HISTORY — PX: PACEMAKER IMPLANT: EP1218

## 2021-12-04 LAB — BASIC METABOLIC PANEL
Anion gap: 8 (ref 5–15)
BUN: 13 mg/dL (ref 8–23)
CO2: 26 mmol/L (ref 22–32)
Calcium: 9.7 mg/dL (ref 8.9–10.3)
Chloride: 105 mmol/L (ref 98–111)
Creatinine, Ser: 1.02 mg/dL — ABNORMAL HIGH (ref 0.44–1.00)
GFR, Estimated: 56 mL/min — ABNORMAL LOW (ref >60)
Glucose, Bld: 104 mg/dL — ABNORMAL HIGH (ref 70–99)
Potassium: 4.1 mmol/L (ref 3.5–5.1)
Sodium: 139 mmol/L (ref 135–145)

## 2021-12-04 LAB — GLUCOSE, CAPILLARY: Glucose-Capillary: 111 mg/dL — ABNORMAL HIGH (ref 70–99)

## 2021-12-04 SURGERY — PACEMAKER IMPLANT

## 2021-12-04 MED ORDER — ACETAMINOPHEN 325 MG PO TABS
325.0000 mg | ORAL_TABLET | ORAL | Status: DC | PRN
  Administered 2021-12-05: 650 mg via ORAL
  Filled 2021-12-04: qty 2

## 2021-12-04 MED ORDER — LIDOCAINE HCL (PF) 1 % IJ SOLN
INTRAMUSCULAR | Status: DC | PRN
Start: 2021-12-04 — End: 2021-12-04
  Administered 2021-12-04: 60 mL

## 2021-12-04 MED ORDER — MIDAZOLAM HCL 5 MG/5ML IJ SOLN
INTRAMUSCULAR | Status: DC | PRN
  Administered 2021-12-04 (×2): 1 mg via INTRAVENOUS

## 2021-12-04 MED ORDER — IOHEXOL 350 MG/ML SOLN
INTRAVENOUS | Status: DC | PRN
  Administered 2021-12-04: 10 mL

## 2021-12-04 MED ORDER — ONDANSETRON HCL 4 MG/2ML IJ SOLN: 4.0000 mg | Freq: Four times a day (QID) | INTRAMUSCULAR | Status: DC | PRN

## 2021-12-04 MED ORDER — CEFAZOLIN SODIUM-DEXTROSE 1-4 GM/50ML-% IV SOLN
1.0000 g | Freq: Four times a day (QID) | INTRAVENOUS | Status: AC
  Administered 2021-12-04 – 2021-12-05 (×3): 1 g via INTRAVENOUS
  Filled 2021-12-04 (×6): qty 50

## 2021-12-04 MED ORDER — CEFAZOLIN SODIUM-DEXTROSE 2-4 GM/100ML-% IV SOLN
INTRAVENOUS | Status: AC
  Filled 2021-12-04: qty 100

## 2021-12-04 MED ORDER — SODIUM CHLORIDE 0.9 % IV SOLN
INTRAVENOUS | Status: AC
  Filled 2021-12-04: qty 2

## 2021-12-04 MED ORDER — FENTANYL CITRATE (PF) 100 MCG/2ML IJ SOLN
INTRAMUSCULAR | Status: DC | PRN
  Administered 2021-12-04 (×2): 12.5 ug via INTRAVENOUS

## 2021-12-04 MED ORDER — MIDAZOLAM HCL 5 MG/5ML IJ SOLN
INTRAMUSCULAR | Status: AC
  Filled 2021-12-04: qty 5

## 2021-12-04 MED ORDER — HEPARIN (PORCINE) IN NACL 1000-0.9 UT/500ML-% IV SOLN
INTRAVENOUS | Status: AC
  Filled 2021-12-04: qty 500

## 2021-12-04 MED ORDER — LIDOCAINE HCL 1 % IJ SOLN
INTRAMUSCULAR | Status: AC
  Filled 2021-12-04: qty 60

## 2021-12-04 MED ORDER — HEPARIN (PORCINE) IN NACL 2-0.9 UNITS/ML
INTRAMUSCULAR | Status: AC | PRN
  Administered 2021-12-04: 500 mL

## 2021-12-04 MED ORDER — FENTANYL CITRATE (PF) 100 MCG/2ML IJ SOLN
INTRAMUSCULAR | Status: AC
  Filled 2021-12-04: qty 2

## 2021-12-04 SURGICAL SUPPLY — 10 items
CABLE SURGICAL S-101-97-12 (CABLE) ×3 IMPLANT
KIT ACCESSORY SELECTRA FIX CVD (MISCELLANEOUS) ×1 IMPLANT
LEAD SELECTRA 3D-55-42 (CATHETERS) ×1 IMPLANT
LEAD SOLIA S PRO MRI 53 (Lead) ×1 IMPLANT
LEAD SOLIA S PRO MRI 60 (Lead) ×1 IMPLANT
PACEMAKER EDORA 8DR-T MRI (Pacemaker) ×1 IMPLANT
PAD DEFIB RADIO PHYSIO CONN (PAD) ×3 IMPLANT
SHEATH 7FR PRELUDE SNAP 13 (SHEATH) ×1 IMPLANT
SHEATH 9FR PRELUDE SNAP 13 (SHEATH) ×1 IMPLANT
TRAY PACEMAKER INSERTION (PACKS) ×3 IMPLANT

## 2021-12-04 NOTE — Progress Notes (Addendum)
? ?Progress Note ? ?Patient Name: Lisa [name] ?Date of Encounter: 12/04/2021 ? ?[facility] Cardiologist: Peter [name], MD  ? ?Subjective  ? ?Mild nausea this AM, resolved, no c/o otherwise ? ?Inpatient Medications  ?  ?Scheduled Meds: ? Chlorhexidine Gluconate Cloth  6 each Topical Daily  ? furosemide  40 mg Intravenous Once  ? hydrALAZINE  10 mg Oral Q8H  ? losartan  100 mg Oral Daily  ? simvastatin  40 mg Oral QPM  ? sodium chloride flush  3 mL Intravenous Q12H  ? ?Continuous Infusions: ? sodium chloride    ? sodium chloride    ? ?PRN Meds: ?acetaminophen **OR** acetaminophen, calcium carbonate, ondansetron **OR** ondansetron (ZOFRAN) IV, pantoprazole  ? ?Vital Signs  ?  ?Vitals:  ? 12/04/21 0500 12/04/21 0533 12/04/21 0600 12/04/21 0700  ?BP: (!) 114/98 (!) 156/71 (!) 168/69 (!) 160/58  ?Pulse: 63 (!) 53 65 (!) 58  ?Resp: '19 16 18 16  '$ ?Temp:      ?TempSrc:      ?SpO2: 96% 98% 98% 97%  ?Weight:      ?Height:      ? ? ?Intake/Output Summary (Last 24 hours) at 12/04/2021 0728 ?Last data filed at 12/04/2021 0600 ?Gross per 24 hour  ?Intake 755.43 ml  ?Output 1800 ml  ?Net -1044.57 ml  ? ?Last 3 Weights 12/02/2021 11/28/2021 11/27/2021  ?Weight (lbs) 165 lb 165 lb 166 lb  ?Weight (kg) 74.844 kg 74.844 kg 75.297 kg  ?   ? ?Telemetry  ?  ?SR, PACs, 70's - Personally Reviewed ? ?ECG  ?  ?No new EKGs - Personally Reviewed ? ?Physical Exam  ? ?GEN: No acute distress.   ?Neck: No JVD ?Cardiac: RRR, no murmurs, rubs, or gallops.  ?Respiratory: CTA b/l. ?GI: Soft, nontender, non-distended  ?MS: No edema; No deformity. ?Neuro:  Nonfocal  ?Psych: Normal affect  ? ?Labs  ?  ?High Sensitivity Troponin:  No results for input(s): TROPONINIHS in the last 720 hours.   ?Chemistry ?Recent Labs  ?Lab 11/27/21 ?1138 12/02/21 ?1626 12/03/21 ?9417 12/04/21 ?0014  ?NA 143 136  --  139  ?K 4.6 3.6  --  4.1  ?CL 104 104  --  105  ?CO2 22 23  --  26  ?GLUCOSE 94 112*  --  104*  ?BUN 19 19  --  13  ?CREATININE 0.86 1.04*  --  1.02*  ?CALCIUM  10.1 9.8  --  9.7  ?MG  --  1.7 1.7  --   ?PROT  --   --  6.6  --   ?ALBUMIN  --   --  3.7  --   ?AST  --   --  28  --   ?ALT  --   --  27  --   ?ALKPHOS  --   --  36  --   ?BILITOT  --   --  0.7  --   ?GFRNONAA  --  55*  --  56*  ?ANIONGAP  --  9  --  8  ?  ?Lipids No results for input(s): CHOL, TRIG, HDL, LABVLDL, LDLCALC, CHOLHDL in the last 168 hours.  ?Hematology ?Recent Labs  ?Lab 11/27/21 ?1138 12/02/21 ?1626  ?WBC 9.6 7.9  ?RBC 4.97 4.32  ?HGB 13.1 11.7*  ?HCT 40.7 35.4*  ?MCV 82 81.9  ?MCH 26.4* 27.1  ?MCHC 32.2 33.1  ?RDW 13.5 14.2  ?PLT 332 255  ? ?Thyroid  ?Recent Labs  ?Lab 12/02/21 ?1626  ?TSH  2.480  ?  ?BNPNo results for input(s): BNP, PROBNP in the last 168 hours.  ?DDimer No results for input(s): DDIMER in the last 168 hours.  ? ?Radiology  ?  ?DG Chest Port 1 View ? ?Result Date: 12/02/2021 ?CLINICAL DATA:  Syncope EXAM: PORTABLE CHEST 1 VIEW COMPARISON:  05/29/2018 FINDINGS: Heart size is upper limits of normal. Aortic atherosclerosis. Chronic interstitial thickening. No focal airspace consolidation. No large pleural fluid collection. No pneumothorax. Hydroxyapatite deposition in the right rotator cuff. IMPRESSION: 1. Chronic interstitial thickening without evidence for an acute cardiopulmonary process. 2. 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 Electronically Signed   By: Davina Poke D.O.   On: 12/02/2021 16:48   ? ?Cardiac Studies  ? ? ?11/30/21: TTE ?1. Left ventricular ejection fraction, by estimation, is 60 to 65%. Left  ?ventricular ejection fraction by 3D volume is 66 %. The left ventricle has  ?normal function. The left ventricle has no regional wall motion  ?abnormalities. Left ventricular diastolic  ? parameters are consistent with Grade III diastolic dysfunction  ?(restrictive). Elevated left ventricular end-diastolic pressure.  ? 2. Right ventricular systolic function is normal. The right ventricular  ?size is mildly enlarged. There is normal pulmonary artery systolic   ?pressure. The estimated right ventricular systolic pressure is 51.8 mmHg.  ? 3. Left atrial size was moderately dilated.  ? 4. Right atrial size was mildly dilated.  ? 5. The mitral valve is normal in structure. No evidence of mitral valve  ?regurgitation. No evidence of mitral stenosis. Severe mitral annular  ?calcification.  ? 6. The aortic valve is tricuspid. Aortic valve regurgitation is not  ?visualized. Aortic valve sclerosis/calcification is present, without any  ?evidence of aortic stenosis.  ? 7. Aortic dilatation noted. 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  ? 8. The inferior vena cava is normal in size with greater than 50%  ?respiratory variability, suggesting right atrial pressure of 3 mmHg.  ? ?Patient Profile  ?   ?81 y.o. female w/PMHx of AFib, HTN, HLD, LBBB, CHF (diastolic) ? ?Pt recently underwent DCCV on 11/28/21  did well, though a few days later developed weak spells, near syncope, EMS called found to have CHB  HR 20's > atropine > atropine > with recovery of HRs towards 100 ? ?Admitted for further management, home lopressor held ? ?Also noted is c/o particularly AM nausea and headaches ongoing about a month ? ?Assessment & Plan  ?  ?AFib ?CHA2DS2Vasc is 4, on Elqius ?S/p DCCV 11/28/21 ?CHB ?LBBB ?Planned for PPM today ?Suspect left bundle pacing ?Preserved LVEF ?Maintaining SR, last dose of Eliquis was last PM, held this AM for pacing ?Will resume ASAP pending pocket stability ? ?Discussed pacer procedure, potential risks/benefots, she is agreeable, pt's husband also bedside ? ?HTN ?Resume BB post pacing ?Renal doppler/US was OK ? ?5. Morning HA and nausea ?She wonders about the lopressor ?Mild nausea only this AM ?She can have times of some elevated BPs without HA/nausea as well. ?Has GI consult outpt next week ?Dr. Caryl Comes has recommended CT head post pacing ? ?For questions or updates, please contact [place] ?Please consult [email or web address] for contact  info under  ?  ?Signed, ?Renee Dyane Dustman, PA-C  ?12/04/2021, 7:28 AM   ? ?EP attending ? ?Patient seen and examined.  She continues with sinus rhythm and one-to-one AV conduction.  She has not had recurrent A-fib.  I discussed the treatment options with the patient.  The risk,  goals, benefits, and expectations of permanent pacemaker insertion were reviewed.  She has Stokes-Adams syncope in the setting of chronic left bundle branch block, and transient complete heart block..  She wishes to proceed. ? ? ?Cristopher Peru, MD ?

## 2021-12-05 ENCOUNTER — Encounter (HOSPITAL_COMMUNITY): Payer: Self-pay | Admitting: Internal Medicine

## 2021-12-05 ENCOUNTER — Inpatient Hospital Stay (HOSPITAL_COMMUNITY): Payer: Medicare Other

## 2021-12-05 LAB — BASIC METABOLIC PANEL
Anion gap: 9 (ref 5–15)
BUN: 16 mg/dL (ref 8–23)
CO2: 24 mmol/L (ref 22–32)
Calcium: 9.5 mg/dL (ref 8.9–10.3)
Chloride: 105 mmol/L (ref 98–111)
Creatinine, Ser: 1 mg/dL (ref 0.44–1.00)
GFR, Estimated: 57 mL/min — ABNORMAL LOW (ref >60)
Glucose, Bld: 104 mg/dL — ABNORMAL HIGH (ref 70–99)
Potassium: 4.1 mmol/L (ref 3.5–5.1)
Sodium: 138 mmol/L (ref 135–145)

## 2021-12-05 LAB — GLUCOSE, CAPILLARY: Glucose-Capillary: 204 mg/dL — ABNORMAL HIGH (ref 70–99)

## 2021-12-05 MED ORDER — OXYCODONE-ACETAMINOPHEN 5-325 MG PO TABS
1.0000 | ORAL_TABLET | Freq: Once | ORAL | Status: AC
  Administered 2021-12-05: 1 via ORAL
  Filled 2021-12-05: qty 1

## 2021-12-05 NOTE — Discharge Instructions (Signed)
? ? ?  Supplemental Discharge Instructions for  ?Pacemaker/Defibrillator Patients ? ? ?Activity ?No heavy lifting or vigorous activity with your left/right arm for 6 to 8 weeks.  Do not raise your left/right arm above your head for one week.  Gradually raise your affected arm as drawn below. ? ?        ?   12/09/21                     12/10/21                   12/11/21                   12/12/21 ?__ ? ?NO DRIVING until cleared to at your wound check visit . ? ?WOUND CARE ?Keep the wound area clean and dry.  Do not get this area wet , no showers for one week; you may shower on  12/12/21   . ?The tape/steri-strips on your wound will fall off; do not pull them off.  No bandage is needed on the site.  DO  NOT apply any creams, oils, or ointments to the wound area. ?If you notice any drainage or discharge from the wound, any swelling or bruising at the site, or you develop a fever > 101? F after you are discharged home, call the office at once. ? ?Special Instructions ?You are still able to use cellular telephones; use the ear opposite the side where you have your pacemaker/defibrillator.  Avoid carrying your cellular phone near your device. ?When traveling through airports, show security personnel your identification card to avoid being screened in the metal detectors.  Ask the security personnel to use the hand wand. ?Avoid arc welding equipment, MRI testing (magnetic resonance imaging), TENS units (transcutaneous nerve stimulators).  Call the office for questions about other devices. ?Avoid electrical appliances that are in poor condition or are not properly grounded. ?Microwave ovens are safe to be near or to operate. ? ? ?

## 2021-12-05 NOTE — Discharge Summary (Addendum)
? ? ? ?ELECTROPHYSIOLOGY PROCEDURE DISCHARGE SUMMARY  ? ? ?Patient ID: Lisa [name],  ?MRN: [id number], DOB/AGE: Jan 06, 1941 81 y.o. ? ?Admit date: 12/02/2021 ?Discharge date: 12/05/2021 ? ?Primary Care Physician: Kristen Loader, FNP  ?Primary Cardiologist: Dr. [name] ?Electrophysiologist: new, Dr. Lovena Le ? ?Primary Discharge Diagnosis:  ?CHB ?Tachy-brady ? ?Secondary Discharge Diagnosis:  ?Persistent Afib ?CHA2DS2Vasc is 4, on Eliquis ?LBBB ?HTN ?CHF (diastolic), chronic ?compensated ? ?Allergies  ?Allergen Reactions  ? Codeine Other (See Comments) and Nausea And Vomiting  ?  GI upset ?Other reaction(s): stomach upset  ? Amlodipine Swelling  ?   edema  ? Fosamax [Alendronate Sodium] Other (See Comments)  ?  Tooth problems  ? Lisinopril Cough  ? Olmesartan Other (See Comments)  ?  Possible photodermatitis ?  ? ? ? ?Procedures This Admission:  ?1.  Implantation of a Biotronik dual chamber PPM on 12/04/21 by Dr Lovena Le.  The patient received  Biotronik (serial number [id number]) pacemaker Biotronik (serial number [id number]) right atrial lead and a Biotronik (serial number [id number]) right ventricular lead  ?There were no immediate post procedure complications. ?2.  CXR on 12/05/21 demonstrated no pneumothorax status post device implantation.  ? ?Brief HPI: ?Lisa [name] is a 81 y.o. female with PMHx including above  underwent DCCV on 11/28/21  did well, though a few days later developed weak spells, near syncope, EMS called found to have CHB  HR 20's > atropine > atropine > with recovery of HRs towards 100 ?She also c/o particularly AM nausea and headaches ongoing about a month ? ?Hospital Course:  ?The patient was admitted and monitored closely in ICU without recurrent heart block, she underwent implantation of a PPM with details as outlined above.  She was monitored on telemetry throughout her stay,  remained in SR with no significant bradycardia, post pacing SR with intermittent APacing/AV pacing.  Given her morning  headaches/nausea, CT brain was obtained with no worrisome findings.   She has follow up with GI out patient for next week in place.  Left chest was without hematoma or ecchymosis.  The device was interrogated and found to be functioning normally.  CXR was obtained and demonstrated no pneumothorax status post device implantation.  Wound care, arm mobility, and restrictions were reviewed with the patient.  The patient feels well, denies any CP/SOB, with minimal site discomfort.  She was examined by Dr. Lovena Le and considered stable for discharge to home.  ? ?Resume Eliquis Sunday 12/10/21 morning ?Resume home lopressor ? ? ?Physical Exam: ?Vitals:  ? 12/05/21 0500 12/05/21 0520 12/05/21 0700 12/05/21 0757  ?BP: 132/65 132/65 135/66   ?Pulse: 60  67   ?Resp: 16  16   ?Temp:    98.1 ?F (36.7 ?C)  ?TempSrc:    Oral  ?SpO2: 90%  93%   ?Weight:      ?Height:      ? ? ?GEN- The patient is well appearing, alert and oriented x 3 today.   ?HEENT: normocephalic, atraumatic; sclera clear, conjunctiva pink; hearing intact; oropharynx clear; neck supple, no JVP ?Lungs- CTA b/l, normal work of breathing.  No wheezes, rales, rhonchi ?Heart- RRR, no murmurs, rubs or gallops, PMI not laterally displaced ?GI- soft, non-tender, non-distended ?Extremities- no clubbing, cyanosis, or edema ?MS- no significant deformity or atrophy ?Skin- warm and dry, no rash or lesion, left chest without hematoma/ecchymosis ?Psych- euthymic mood, full affect ?Neuro- no gross deficits ? ? ?Labs: ?  ?Lab Results  ?Component Value Date  ? WBC 7.9  12/02/2021  ? HGB 11.7 (L) 12/02/2021  ? HCT 35.4 (L) 12/02/2021  ? MCV 81.9 12/02/2021  ? PLT 255 12/02/2021  ?  ?Recent Labs  ?Lab 12/03/21 ?4709 12/04/21 ?0014 12/05/21 ?6283  ?NA  --    < > 138  ?K  --    < > 4.1  ?CL  --    < > 105  ?CO2  --    < > 24  ?BUN  --    < > 16  ?CREATININE  --    < > 1.00  ?CALCIUM  --    < > 9.5  ?PROT 6.6  --   --   ?BILITOT 0.7  --   --   ?ALKPHOS 49  --   --   ?ALT 27  --   --    ?AST 28  --   --   ?GLUCOSE  --    < > 104*  ? < > = values in this interval not displayed.  ? ? ?Discharge Medications:  ?Allergies as of 12/05/2021   ? ?   Reactions  ? Codeine Other (See Comments), Nausea And Vomiting  ? GI upset ?Other reaction(s): stomach upset  ? Amlodipine Swelling  ?  edema  ? Fosamax [alendronate Sodium] Other (See Comments)  ? Tooth problems  ? Lisinopril Cough  ? Olmesartan Other (See Comments)  ? Possible photodermatitis  ? ?  ? ?  ?Medication List  ?  ? ?TAKE these medications   ? ?calcium carbonate 500 MG chewable tablet ?Commonly known as: TUMS - dosed in mg elemental calcium ?Chew 1-2 tablets by mouth 2 (two) times daily as needed for indigestion or heartburn. ?  ?Eliquis 5 MG Tabs tablet ?Generic drug: apixaban ?TAKE 1 TABLET BY MOUTH TWICE A DAY ?What changed: how much to take ?Notes to patient: Resume on Sunday 12/10/21 morning ?  ?hydrochlorothiazide 25 MG tablet ?Commonly known as: HYDRODIURIL ?Take 25 mg by mouth daily. ?  ?losartan 100 MG tablet ?Commonly known as: COZAAR ?TAKE 1 TABLET BY MOUTH EVERY DAY ?  ?metoprolol tartrate 25 MG tablet ?Commonly known as: LOPRESSOR ?Take 1 tablet (25 mg total) by mouth 2 (two) times daily. ?What changed:  ?how much to take ?when to take this ?additional instructions ?  ?pantoprazole 40 MG tablet ?Commonly known as: PROTONIX ?Take 40 mg by mouth daily as needed (gerd/heartburn). ?  ?simvastatin 40 MG tablet ?Commonly known as: ZOCOR ?Take 40 mg by mouth every evening. ?  ?Systane 0.4-0.3 % Soln ?Generic drug: Polyethyl Glycol-Propyl Glycol ?Place 1-2 drops into both eyes 3 (three) times daily as needed (dry/irritated eyes.). ?  ?Vitamin D3 50 MCG (2000 UT) Tabs ?Take 2,000 Units by mouth daily. ?  ? ?  ? ?  ?  ? ? ?  ?Discharge Care Instructions  ?(From admission, onward)  ?  ? ? ?  ? ?  Start     Ordered  ? 12/05/21 0000  Discharge wound care:       ?Comments: As per AVS instructions  ? 12/05/21 0832  ? ?  ?  ? ?  ? ? ?Disposition:  Home ?Discharge Instructions   ? ? Diet - low sodium heart healthy   Complete by: As directed ?  ? Discharge wound care:   Complete by: As directed ?  ? As per AVS instructions  ? Increase activity slowly   Complete by: As directed ?  ? ?  ? ? ? ?Duration of  Discharge Encounter: Greater than 30 minutes including physician time. ? ?Signed, ?Tommye Standard, PA-C ?12/05/2021 ?8:33 AM ? ?EP Attending ? ?Patient seen and examined. Agree with above. The patient is stable after PPM insertion. She will be discharged home today with usual followup. PPM under my direction demonstrates normal DDD PM function.  ? ?Cristopher Peru, MD ? ? ?

## 2021-12-13 DIAGNOSIS — K219 Gastro-esophageal reflux disease without esophagitis: Secondary | ICD-10-CM | POA: Diagnosis not present

## 2021-12-13 DIAGNOSIS — R110 Nausea: Secondary | ICD-10-CM | POA: Diagnosis not present

## 2021-12-13 DIAGNOSIS — Z7901 Long term (current) use of anticoagulants: Secondary | ICD-10-CM | POA: Diagnosis not present

## 2021-12-13 DIAGNOSIS — R142 Eructation: Secondary | ICD-10-CM | POA: Diagnosis not present

## 2021-12-13 DIAGNOSIS — I4891 Unspecified atrial fibrillation: Secondary | ICD-10-CM | POA: Diagnosis not present

## 2021-12-13 DIAGNOSIS — D649 Anemia, unspecified: Secondary | ICD-10-CM | POA: Diagnosis not present

## 2021-12-14 ENCOUNTER — Other Ambulatory Visit: Payer: Self-pay

## 2021-12-14 ENCOUNTER — Ambulatory Visit (INDEPENDENT_AMBULATORY_CARE_PROVIDER_SITE_OTHER): Payer: Medicare Other

## 2021-12-14 DIAGNOSIS — I442 Atrioventricular block, complete: Secondary | ICD-10-CM | POA: Diagnosis not present

## 2021-12-14 LAB — CUP PACEART INCLINIC DEVICE CHECK
Date Time Interrogation Session: 20230323163453
Implantable Lead Implant Date: 20230313
Implantable Lead Implant Date: 20230313
Implantable Lead Location: 753859
Implantable Lead Location: 753860
Implantable Lead Model: 377171
Implantable Lead Model: 377171
Implantable Lead Serial Number: 7000407809
Implantable Lead Serial Number: 8000780983
Implantable Pulse Generator Implant Date: 20230313
Pulse Gen Model: 407145
Pulse Gen Serial Number: 70364045

## 2021-12-14 NOTE — Patient Instructions (Signed)

## 2021-12-14 NOTE — Progress Notes (Signed)

## 2021-12-20 NOTE — Progress Notes (Signed)
?Cardiology Office Note:   ? ?Date:  12/28/2021  ? ?ID:  Lisa [name], DOB 04-20-1941, MRN [id number] ? ?PCP:  Kristen Loader, FNP ?[place] Cardiologist: Rama Sorci [name], MD  ? ?Reason for visit: Follow-up ED visit for HTN ? ?History of Present Illness:   ? ?Lisa [name] is a 81 y.o. female with a hx of atrial fibrillation s/p DCCV in 07/2018 (sx SOB, swelling, indigestion), hypertension, hyperlipidemia, left bundle branch block.   Myoview in January 2019 was normal.  She last saw Dr. [name] on June 19, 2021 and was doing well.   ? ?She went to [facility] and complained of sudden right shoulder pain along with nausea and one episode of vomiting.  She took aspirin and her symptoms improved.  Troponin unremarkable.  Chest x-ray with mild enlargement of the cardiomediastinal silhouette and borderline pulmonary venous congestion. Pain felt to be musculoskeletal related to fall.  ? ?Recently she was seen in ED at [facility]. She had an episode of syncope at home. Following this she had acute N/V and diarrhea. BP was quite high. Ecg showed NSR with chronic LBBB. BP was quite high to 202/110. Labs including troponins were negative. She hasn't had any further dizziness or syncope but has persistent elevated BP.  She was on metoprolol, losartan and HCTZ. ? ?When seen in Feb she was in NSR. Later presented in March with recurrent Afib. She underwent successful DCCV. 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 She was admitted and underwent placement of dual chamber pacemaker by Dr Lovena Le. Echo was unremarkable. She was DC on metoprolol and Eliquis. Prior renal artery duplex was negative for RAS.  ? ?On follow up she is very appreciative of the care from Dr Percival Spanish and Dr Caryl Comes. She denies any chest pain or dizziness. No syncope. No palpitations. Pacer site is healing well.  For 2 weeks after her hospital stay she  had nausea and vomiting every morning after her pills. She stopped taking simvastatin and her symptoms resolved. BP at home ranges 130-158/61-86.  ? ? ?  ?Past Medical History:  ?Diagnosis Date  ? Bundle branch block, left   ? Dyslipidemia   ? GERD (gastroesophageal reflux disease)   ? Hypercholesteremia   ? Hypertension   ? LBBB (left bundle branch block)   ? UTI (urinary tract infection)   ? ? ?Past Surgical History:  ?Procedure Laterality Date  ? bladder polyp removal    ? BREAST EXCISIONAL BIOPSY Right   ? 60 yrs ago- Benign  ? BREAST LUMPECTOMY Left   ? No Visible Scar, said 30 + years ago in situ, no chemo, no radiation, or hormone replacement    ? CARDIOVERSION N/A 07/30/2018  ? Procedure: CARDIOVERSION;  Surgeon: Jerline Pain, MD;  Location: [facility] ENDOSCOPY;  Service: Cardiovascular;  Laterality: N/A;  ? CARDIOVERSION N/A 11/28/2021  ? Procedure: CARDIOVERSION;  Surgeon: Evans Lance, MD;  Location: [place] CV LAB;  Service: Cardiovascular;  Laterality: N/A;  ? CESAREAN SECTION    ? 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  ? Procedure: PACEMAKER IMPLANT;  Surgeon: Evans Lance, MD;  Location: [place] CV LAB;  Service: Cardiovascular;  Laterality: N/A;  ? ? ?Current Medications: ?Current Meds  ?Medication Sig  ? calcium carbonate (TUMS - DOSED IN MG ELEMENTAL CALCIUM) 500 MG chewable tablet Chew 1-2 tablets by mouth 2 (two) times daily as needed  for indigestion or heartburn.  ? Cholecalciferol (VITAMIN D3) 50 MCG (2000 UT) TABS Take 2,000 Units by mouth daily.  ? ELIQUIS 5 MG TABS tablet TAKE 1 TABLET BY MOUTH TWICE A DAY (Patient taking differently: Take 5 mg by mouth 2 (two) times daily.)  ? hydrochlorothiazide (HYDRODIURIL) 25 MG tablet Take 25 mg by mouth daily.  ? losartan (COZAAR) 100 MG tablet TAKE 1 TABLET BY MOUTH EVERY DAY (Patient taking differently: Take 100 mg by mouth daily.)  ? metoprolol tartrate (LOPRESSOR) 25 MG tablet Take 1 tablet (25 mg total) by mouth 2 (two) times daily. (Patient  taking differently: Take 12.5-25 mg by mouth See admin instructions. Take 1 tablet (25 mg) by mouth in the morning & take 0.5 tablet (12.5 mg) by mouth in the evening.)  ? pantoprazole (PROTONIX) 40 MG tablet Take 40 mg by mouth daily as needed (gerd/heartburn).  ? Polyethyl Glycol-Propyl Glycol (SYSTANE) 0.4-0.3 % SOLN Place 1-2 drops into both eyes 3 (three) times daily as needed (dry/irritated eyes.).  ? [DISCONTINUED] simvastatin (ZOCOR) 40 MG tablet Take 40 mg by mouth every evening.  ?  ? ?Allergies:   Codeine, Amlodipine, Fosamax [alendronate sodium], Lisinopril, and Olmesartan  ? ?Social History  ? ?Socioeconomic History  ? Marital status: Married  ?  Spouse name: Not on file  ? Number of children: 2  ? Years of education: Not on file  ? Highest education level: Not on file  ?Occupational History  ? Not on file  ?Tobacco Use  ? Smoking status: Former  ?  Years: 20.00  ?  Types: Cigarettes  ?  Quit date: 1  ?  Years since quitting: 30.2  ? Smokeless tobacco: Never  ?Vaping Use  ? Vaping Use: Never used  ?Substance and Sexual Activity  ? Alcohol use: Yes  ? Drug use: No  ? Sexual activity: Not on file  ?Other Topics Concern  ? Not on file  ?Social History Narrative  ? Not on file  ? ?Social Determinants of Health  ? ?Financial Resource Strain: Not on file  ?Food Insecurity: Not on file  ?Transportation Needs: Not on file  ?Physical Activity: Not on file  ?Stress: Not on file  ?Social Connections: Not on file  ?  ? ?Family History: ?The patient's family history includes Breast cancer in her mother; CVA in her brother and brother; Crohn's disease in her brother; Diabetes in her brother; Hip fracture in her mother; Other in her brother; Renal cancer in her brother; Ulcers in her father. ? ?ROS:   ?Please see the history of present illness.    ? ?EKGs/Labs/Other Studies Reviewed:   ? ?EKG:  The ekg today shows atrial pacing with V sensing. LBBB. Rate 60. I have personally reviewed and interpreted this  study. ? ? ?Recent Labs: ?12/02/2021: Hemoglobin 11.7; Platelets 255; TSH 2.480 ?12/03/2021: ALT 27; Magnesium 1.7 ?12/05/2021: BUN 16; Creatinine, Ser 1.00; Potassium 4.1; Sodium 138  ? ?Recent Lipid Panel ?No results found for: CHOL, TRIG, HDL, LDLCALC, LDLDIRECT ? ?Dated 06/03/20: cholesterol 159, triglycerides 94, HDL 59, LDL 83. ?Dated 06/15/21: A1c 6.3, Hgb 12.5. chemistries and TSH normal. ?Dated 11/15/21:A1c 6.3% ? ? ?Echo 11/30/21: IMPRESSIONS  ? ? ? 1. Left ventricular ejection fraction, by estimation, is 60 to 65%. Left  ?ventricular ejection fraction by 3D volume is 66 %. The left ventricle has  ?normal function. The left ventricle has no regional wall motion  ?abnormalities. Left ventricular diastolic  ? parameters are consistent with Grade III  diastolic dysfunction  ?(restrictive). Elevated left ventricular end-diastolic pressure.  ? 2. Right ventricular systolic function is normal. The right ventricular  ?size is mildly enlarged. There is normal pulmonary artery systolic  ?pressure. The estimated right ventricular systolic pressure is 94.8 mmHg.  ? 3. Left atrial size was moderately dilated.  ? 4. Right atrial size was mildly dilated.  ? 5. The mitral valve is normal in structure. No evidence of mitral valve  ?regurgitation. No evidence of mitral stenosis. Severe mitral annular  ?calcification.  ? 6. The aortic valve is tricuspid. Aortic valve regurgitation is not  ?visualized. Aortic valve sclerosis/calcification is present, without any  ?evidence of aortic stenosis.  ? 7. Aortic dilatation noted. 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  ? 8. The inferior vena cava is normal in size with greater than 50%  ?respiratory variability, suggesting right atrial pressure of 3 mmHg.  ? ?Physical Exam:   ? ?VS:  BP (!) 150/60 (BP Location: Left Arm)   Pulse 60   Ht '5\' 5"'$  (1.651 m)   Wt 164 lb 9.6 oz (74.7 kg)   SpO2 95%   BMI 27.39 kg/m?    ?No data found. ? ?Wt Readings from  Last 3 Encounters:  ?12/28/21 164 lb 9.6 oz (74.7 kg)  ?12/02/21 165 lb (74.8 kg)  ?11/28/21 165 lb (74.8 kg)  ?  ? ?GEN:  Well nourished, well developed in no acute distress ?HEENT: Normal ?NECK: No JVD; No carotid bruit

## 2021-12-22 ENCOUNTER — Telehealth: Payer: Self-pay | Admitting: Cardiology

## 2021-12-22 NOTE — Telephone Encounter (Signed)
? ?  Geni Bers calling to give zio result ?

## 2021-12-22 NOTE — Telephone Encounter (Signed)
This is for DOS 12/01/21 thru 12/02/21; I will bring report to you ?

## 2021-12-22 NOTE — Telephone Encounter (Signed)
Already spoke to patient she is feeling well.She had a pacemaker implant 3 weeks ago.Advised to keep appointment with Dr.Jordan as planned. ?

## 2021-12-28 ENCOUNTER — Other Ambulatory Visit: Payer: Self-pay

## 2021-12-28 ENCOUNTER — Ambulatory Visit: Payer: Medicare Other | Admitting: Cardiology

## 2021-12-28 ENCOUNTER — Encounter: Payer: Self-pay | Admitting: Cardiology

## 2021-12-28 VITALS — BP 150/60 | HR 60 | Ht 65.0 in | Wt 164.6 lb

## 2021-12-28 DIAGNOSIS — I442 Atrioventricular block, complete: Secondary | ICD-10-CM

## 2021-12-28 DIAGNOSIS — I480 Paroxysmal atrial fibrillation: Secondary | ICD-10-CM

## 2021-12-28 DIAGNOSIS — I10 Essential (primary) hypertension: Secondary | ICD-10-CM

## 2021-12-28 DIAGNOSIS — I447 Left bundle-branch block, unspecified: Secondary | ICD-10-CM | POA: Diagnosis not present

## 2021-12-28 MED ORDER — HYDROCHLOROTHIAZIDE 25 MG PO TABS: 25.0000 mg | ORAL_TABLET | Freq: Every day | ORAL | 3 refills | Status: DC

## 2021-12-28 MED ORDER — APIXABAN 5 MG PO TABS: 5.0000 mg | ORAL_TABLET | Freq: Two times a day (BID) | ORAL | 3 refills | Status: DC

## 2021-12-28 MED ORDER — METOPROLOL TARTRATE 25 MG PO TABS: 25.0000 mg | ORAL_TABLET | Freq: Two times a day (BID) | ORAL | 3 refills | Status: DC

## 2021-12-28 NOTE — Patient Instructions (Addendum)
Increase Metoprolol to 25 mg bid.  ? ?Stay off simvastatin ? ?Continue your other medications ? ? ?

## 2021-12-29 ENCOUNTER — Telehealth: Payer: Self-pay | Admitting: Cardiology

## 2021-12-29 NOTE — Telephone Encounter (Signed)
Pt states that she needs clarification on next appt due to have seen Dr. [name] yesterday 12/28/21. Please advise ?

## 2021-12-29 NOTE — Telephone Encounter (Signed)
Returned call to patient who states she was calling to see if she needed her June appointment and July appointments with Dr. [name].  ? ?Upon chart review patient is to see Dr. [name] back in 3 months from yesterday's appointment.  ? ?Advised patient that June appointment has been cancelled and to keep July appointment with Dr. [name] as scheduled.  ? ?Patient aware and verbalized understanding.  ?

## 2022-01-03 ENCOUNTER — Telehealth: Payer: Self-pay | Admitting: Plastic Surgery

## 2022-01-03 ENCOUNTER — Ambulatory Visit: Payer: Medicare Other | Admitting: Emergency Medicine

## 2022-01-03 ENCOUNTER — Encounter: Payer: Self-pay | Admitting: Cardiology

## 2022-01-03 NOTE — Telephone Encounter (Signed)
Patient called and reported that she has a facial mole that is bleeding. She would like to schedule a consult. No appts available for her preferred provider Dr. Marla Roe until July. She does not want another providerAdded to the waitlist and will make note of the issue.  ?

## 2022-02-02 ENCOUNTER — Ambulatory Visit: Payer: Medicare Other | Admitting: Plastic Surgery

## 2022-02-02 ENCOUNTER — Encounter: Payer: Self-pay | Admitting: Plastic Surgery

## 2022-02-02 VITALS — BP 172/79 | HR 60 | Ht 65.0 in | Wt 166.8 lb

## 2022-02-02 DIAGNOSIS — N750 Cyst of Bartholin's gland: Secondary | ICD-10-CM | POA: Insufficient documentation

## 2022-02-02 DIAGNOSIS — L989 Disorder of the skin and subcutaneous tissue, unspecified: Secondary | ICD-10-CM

## 2022-02-02 NOTE — Progress Notes (Addendum)
? ?  Subjective:  ? ? Patient ID: Lisa [name], female    DOB: Jan 10, 1941, 81 y.o.   MRN: [id number] ? ?Patient is a 81 year old female here for evaluation of her cheek.  Since making the appointment her cheek is completely resolved.  She had some concerns about a little bleeding area.  It seems to have healed without incident.  But she does asked me to take a look at a cyst on her left labia.  It is looks like a Bartholin's cyst.  She does not have a GYN right now.  She has an appointment scheduled for couple months from now.  It is only tender if it is touched.  It does not appear infected.  It is firm and not mobile and approximately 1.5 cm in size. ? ? ? ? ?Review of Systems  ?Constitutional: Negative.   ?HENT: Negative.    ?Eyes: Negative.   ?Respiratory: Negative.    ?Cardiovascular: Negative.   ?Gastrointestinal: Negative.   ?Endocrine: Negative.   ?Genitourinary: Negative.   ?Skin:  Positive for color change.  ?Neurological: Negative.   ?Hematological: Negative.   ? ?   ?Objective:  ? Physical Exam ?Vitals reviewed.  ?Constitutional:   ?   Appearance: Normal appearance.  ?Cardiovascular:  ?   Rate and Rhythm: Normal rate.  ?   Pulses: Normal pulses.  ?Pulmonary:  ?   Effort: Pulmonary effort is normal.  ?Skin: ?   Coloration: Skin is not jaundiced.  ?   Findings: Lesion present. No bruising.  ?Neurological:  ?   Mental Status: She is alert and oriented to person, place, and time.  ?Psychiatric:     ?   Mood and Affect: Mood normal.     ?   Behavior: Behavior normal.     ?   Thought Content: Thought content normal.  ? ? ? ?   ?Assessment & Plan:  ? ?  ICD-10-CM   ?1. Changing skin lesion  L98.9   ?  ?2. Bartholin cyst  N75.0   ?  ?  ?I gave her the option of waiting to get into a GYN or for [place] to do it.  She would like to go ahead and plan to have this excised the area.  We described the process and we can get her scheduled.  I will get a picture at the time. ?Plan for excision of left labial Bartholin cyst. ?

## 2022-02-08 ENCOUNTER — Telehealth: Payer: Self-pay | Admitting: Plastic Surgery

## 2022-02-08 NOTE — Telephone Encounter (Signed)
Spoke with Lisa [name] at [facility] regarding whether precert is required for cpt codes [phone] and [phone]. NO PA REQ'D as long procedure is done in outpatient setting. Reference number is [id number]  Procedure scheduled in clinic 04/03/2022

## 2022-02-21 ENCOUNTER — Telehealth: Payer: Self-pay | Admitting: Cardiology

## 2022-02-21 NOTE — Telephone Encounter (Signed)
Pt is in the chair, Crystal is wanting to know if it's ok to do a Cleaning being that pt has a Pacemaker. Call transferred

## 2022-02-21 NOTE — Telephone Encounter (Signed)
I have called back and spoke with Crystal, patient is cleared to proceed with dental cleaning.  I also spoke with our device clinic, she is safe to use ultrasonic scaler during dental cleaning.

## 2022-02-23 ENCOUNTER — Ambulatory Visit: Payer: Medicare Other | Admitting: Cardiology

## 2022-02-26 ENCOUNTER — Other Ambulatory Visit: Payer: Self-pay | Admitting: Cardiology

## 2022-02-26 NOTE — Telephone Encounter (Signed)
Prescription refill request for Eliquis received. Indication:Afib Last office visit:4/23 Scr:1.0 Age: 81 Weight:75.7 kg  Prescription refilled

## 2022-03-01 ENCOUNTER — Other Ambulatory Visit: Payer: Self-pay | Admitting: *Deleted

## 2022-03-01 MED ORDER — HYDROCHLOROTHIAZIDE 25 MG PO TABS: 25.0000 mg | ORAL_TABLET | Freq: Every day | ORAL | 3 refills | Status: DC

## 2022-03-05 ENCOUNTER — Encounter: Payer: Medicare Other | Admitting: Internal Medicine

## 2022-03-06 ENCOUNTER — Ambulatory Visit (INDEPENDENT_AMBULATORY_CARE_PROVIDER_SITE_OTHER): Payer: Medicare Other

## 2022-03-06 DIAGNOSIS — I442 Atrioventricular block, complete: Secondary | ICD-10-CM | POA: Diagnosis not present

## 2022-03-06 LAB — CUP PACEART REMOTE DEVICE CHECK
Date Time Interrogation Session: 20230613101249
Implantable Lead Implant Date: 20230313
Implantable Lead Implant Date: 20230313
Implantable Lead Location: 753859
Implantable Lead Location: 753860
Implantable Lead Model: 377171
Implantable Lead Model: 377171
Implantable Lead Serial Number: 7000407809
Implantable Lead Serial Number: 8000780983
Implantable Pulse Generator Implant Date: 20230313
Pulse Gen Model: 407145
Pulse Gen Serial Number: 70364045

## 2022-03-14 ENCOUNTER — Encounter: Payer: Self-pay | Admitting: Emergency Medicine

## 2022-03-14 ENCOUNTER — Ambulatory Visit (INDEPENDENT_AMBULATORY_CARE_PROVIDER_SITE_OTHER): Payer: Medicare Other | Admitting: Emergency Medicine

## 2022-03-14 VITALS — BP 130/70 | HR 78 | Temp 97.9°F | Ht 65.0 in | Wt 166.2 lb

## 2022-03-14 DIAGNOSIS — Z23 Encounter for immunization: Secondary | ICD-10-CM | POA: Diagnosis not present

## 2022-03-14 DIAGNOSIS — I442 Atrioventricular block, complete: Secondary | ICD-10-CM | POA: Diagnosis not present

## 2022-03-14 DIAGNOSIS — I10 Essential (primary) hypertension: Secondary | ICD-10-CM | POA: Diagnosis not present

## 2022-03-14 DIAGNOSIS — E785 Hyperlipidemia, unspecified: Secondary | ICD-10-CM | POA: Diagnosis not present

## 2022-03-14 DIAGNOSIS — K219 Gastro-esophageal reflux disease without esophagitis: Secondary | ICD-10-CM | POA: Diagnosis not present

## 2022-03-14 DIAGNOSIS — N1831 Chronic kidney disease, stage 3a: Secondary | ICD-10-CM

## 2022-03-14 DIAGNOSIS — I4891 Unspecified atrial fibrillation: Secondary | ICD-10-CM | POA: Diagnosis not present

## 2022-03-14 DIAGNOSIS — Z7689 Persons encountering health services in other specified circumstances: Secondary | ICD-10-CM

## 2022-03-14 DIAGNOSIS — R7301 Impaired fasting glucose: Secondary | ICD-10-CM

## 2022-03-14 LAB — CBC WITH DIFFERENTIAL/PLATELET
Basophils Absolute: 0.1 10*3/uL (ref 0.0–0.1)
Basophils Relative: 0.6 % (ref 0.0–3.0)
Eosinophils Absolute: 0.3 10*3/uL (ref 0.0–0.7)
Eosinophils Relative: 3.4 % (ref 0.0–5.0)
HCT: 38.6 % (ref 36.0–46.0)
Hemoglobin: 12.6 g/dL (ref 12.0–15.0)
Lymphocytes Relative: 27.5 % (ref 12.0–46.0)
Lymphs Abs: 2.6 10*3/uL (ref 0.7–4.0)
MCHC: 32.6 g/dL (ref 30.0–36.0)
MCV: 81.4 fl (ref 78.0–100.0)
Monocytes Absolute: 0.6 10*3/uL (ref 0.1–1.0)
Monocytes Relative: 6.5 % (ref 3.0–12.0)
Neutro Abs: 5.9 10*3/uL (ref 1.4–7.7)
Neutrophils Relative %: 62 % (ref 43.0–77.0)
Platelets: 274 10*3/uL (ref 150.0–400.0)
RBC: 4.74 Mil/uL (ref 3.87–5.11)
RDW: 14.2 % (ref 11.5–15.5)
WBC: 9.5 10*3/uL (ref 4.0–10.5)

## 2022-03-14 LAB — COMPREHENSIVE METABOLIC PANEL
ALT: 17 U/L (ref 0–35)
AST: 23 U/L (ref 0–37)
Albumin: 4.2 g/dL (ref 3.5–5.2)
Alkaline Phosphatase: 57 U/L (ref 39–117)
BUN: 18 mg/dL (ref 6–23)
CO2: 30 mEq/L (ref 19–32)
Calcium: 10.1 mg/dL (ref 8.4–10.5)
Chloride: 101 mEq/L (ref 96–112)
Creatinine, Ser: 1.08 mg/dL (ref 0.40–1.20)
GFR: 48.76 mL/min — ABNORMAL LOW (ref >60.00)
Glucose, Bld: 105 mg/dL — ABNORMAL HIGH (ref 70–99)
Potassium: 3.9 mEq/L (ref 3.5–5.1)
Sodium: 137 mEq/L (ref 135–145)
Total Bilirubin: 0.7 mg/dL (ref 0.2–1.2)
Total Protein: 7.3 g/dL (ref 6.0–8.3)

## 2022-03-14 LAB — LIPID PANEL
Cholesterol: 238 mg/dL — ABNORMAL HIGH (ref 0–200)
HDL: 61.9 mg/dL (ref >39.00)
LDL Cholesterol: 146 mg/dL — ABNORMAL HIGH (ref 0–99)
NonHDL: 176.05
Total CHOL/HDL Ratio: 4
Triglycerides: 150 mg/dL — ABNORMAL HIGH (ref 0.0–149.0)
VLDL: 30 mg/dL (ref 0.0–40.0)

## 2022-03-14 LAB — HEMOGLOBIN A1C: Hgb A1c MFr Bld: 6.4 % (ref 4.6–6.5)

## 2022-03-14 NOTE — Assessment & Plan Note (Signed)
Well-controlled hypertension. Continue losartan 100 mg and hydrochlorothiazide 25 mg daily. Cardiovascular risks associated with hypertension discussed. No history of congestive heart failure.

## 2022-03-14 NOTE — Progress Notes (Signed)
Lisa [name] 81 y.o.   Chief Complaint  Patient presents with   New Patient (Initial Visit)    Multiple issues    HISTORY OF PRESENT ILLNESS: This is a 81 y.o. female first visit to this office, here to establish care with me. Has the following chronic medical problems: 1.  Hypertension: Normal blood pressure readings at home.  Presently on hydrochlorothiazide, metoprolol tartrate, and losartan 2.  Chronic A-fib: On Eliquis and beta-blocker 3.  History of complete heart block.  Recently had permanent pacemaker put in.  Stable. 4.  History of GERD: On pantoprazole.  Stable.  Doing well. 5.  History of dyslipidemia but very sensitive to statins.  Not on statin treatment at present time as recommended by cardiologist. 6.  Chronic kidney disease stage IIIa No history of diabetes. Doing well.  No complaints or medical concerns today. Most recent cardiologist office visit note and assessment and plan as follows: Assessment ASSESSMENT AND PLAN      HTN resistant. Essential.  -will continue losartan, HCTZ and increase metoprolol to 25 mg bid.  - Dash diet.   2. Paroxysmal atrial fibrillation -Status post DCCV in November 2019 -Echo showed mild LV dysfunction with dyssynergy due to LBBB. Moderate LAE.   - more recent recurrence s/p successful DCCV.  - in Apaced rhthym now.  -Continue Eliquis for stroke prevention and metoprolol for rate control.   - recent Echo showed normal LV function   3. LBBB chronic.    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    5. HLD. Recent N/V resolved with stopping simvastatin. No known history of vascular disease. Will stay off statin and reassess lipids in 3 months.        Disposition - Follow-up in HTN clinic in 3 weeks. Follow up with me 3 months.       Medication Adjustments/Labs and Tests Ordered: Current medicines are reviewed at length with the patient today.  Concerns regarding  medicines are outlined above.  No orders of the defined types were placed in this encounter.   No orders of the defined types were placed in this encounter.     Patient Instructions  Increase Metoprolol to 25 mg bid.    Stay off simvastatin   Continue your other medications       Signed, Peter [name], MD  12/28/2021 10:07 AM    [name]     BP Readings from Last 3 Encounters:  02/02/22 (!) 172/79  12/28/21 (!) 150/60  12/05/21 135/66     HPI   Prior to Admission medications   Medication Sig Start Date End Date Taking? Authorizing Provider  calcium carbonate (TUMS - DOSED IN MG ELEMENTAL CALCIUM) 500 MG chewable tablet Chew 1-2 tablets by mouth 2 (two) times daily as needed for indigestion or heartburn.    [provider]  Cholecalciferol (VITAMIN D3) 50 MCG (2000 UT) TABS Take 2,000 Units by mouth daily.    [provider]  ELIQUIS 5 MG TABS tablet TAKE 1 TABLET BY MOUTH TWICE DAILY 02/26/22   [name], Peter M, MD  hydrochlorothiazide (HYDRODIURIL) 25 MG tablet Take 1 tablet (25 mg total) by mouth daily. 03/01/22   [name], Peter M, MD  losartan (COZAAR) 100 MG tablet TAKE 1 TABLET BY MOUTH EVERY DAY Patient taking differently: Take 100 mg by mouth daily. 08/23/21   [name], Peter M, MD  metoprolol tartrate (LOPRESSOR) 25 MG tablet Take 1 tablet (  25 mg total) by mouth 2 (two) times daily. 12/28/21   [name], Peter M, MD  pantoprazole (PROTONIX) 40 MG tablet Take 40 mg by mouth daily as needed (gerd/heartburn). 07/04/18   [provider]  Polyethyl Glycol-Propyl Glycol (SYSTANE) 0.4-0.3 % SOLN Place 1-2 drops into both eyes 3 (three) times daily as needed (dry/irritated eyes.).    [provider]    Allergies  Allergen Reactions   Codeine Other (See Comments) and Nausea And Vomiting    GI upset Other reaction(s): stomach upset   Amlodipine Swelling     edema   Fosamax [Alendronate Sodium] Other (See Comments)     Tooth problems   Lisinopril Cough   Olmesartan Other (See Comments)    Possible photodermatitis     Patient Active Problem List   Diagnosis Date Noted   Dyslipidemia 03/14/2022   Bartholin cyst 02/02/2022   Complete heart block ([place]) 12/02/2021   Age-related osteoporosis without current pathological fracture 11/27/2021   Chronic kidney disease, stage 3a ([place]) 11/27/2021   Essential hypertension 11/27/2021   Impaired fasting glucose 11/27/2021   Mixed hyperlipidemia 11/27/2021   Personal history of malignant neoplasm of breast 11/27/2021   Personal history of urinary calculi 11/27/2021   Type 2 diabetes mellitus with other specified complication ([place]) 50/05/3817   Vitamin D deficiency 11/27/2021   Changing skin lesion 05/12/2021   Encounter for counseling 07/10/2019   Actinic keratosis 02/24/2019   Atrial fibrillation (HCC)    Gastro-esophageal reflux disease without esophagitis    Left bundle branch block    Pilar cyst 04/17/2016    Past Medical History:  Diagnosis Date   Bundle branch block, left    Dyslipidemia    GERD (gastroesophageal reflux disease)    Hypercholesteremia    Hypertension    LBBB (left bundle branch block)    UTI (urinary tract infection)     Past Surgical History:  Procedure Laterality Date   bladder polyp removal     BREAST EXCISIONAL BIOPSY Right    60 yrs ago- Benign   BREAST LUMPECTOMY Left    No Visible Scar, said 30 + years ago in situ, no chemo, no radiation, or hormone replacement     CARDIOVERSION N/A 07/30/2018   Procedure: CARDIOVERSION;  Surgeon: Jerline Pain, MD;  Location: [place];  Service: Cardiovascular;  Laterality: N/A;   CARDIOVERSION N/A 11/28/2021   Procedure: CARDIOVERSION;  Surgeon: Evans Lance, MD;  Location: [facility];  Service: Cardiovascular;  Laterality: N/A;   CESAREAN SECTION     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   Procedure: PACEMAKER IMPLANT;  Surgeon: Evans Lance, MD;  Location: [place]  [facility];  Service: Cardiovascular;  Laterality: N/A;    Social History   Socioeconomic History   Marital status: Married    Spouse name: Not on file   Number of children: 2   Years of education: Not on file   Highest education level: Not on file  Occupational History   Not on file  Tobacco Use   Smoking status: Former    Years: 20.00    Types: Cigarettes    Quit date: 89    Years since quitting: 30.4   Smokeless tobacco: Never  Vaping Use   Vaping Use: Never used  Substance and Sexual Activity   Alcohol use: Yes   Drug use: No   Sexual activity: Not on file  Other Topics Concern   Not on file  Social History Narrative  Not on file   Social Determinants of Health   Financial Resource Strain: Not on file  Food Insecurity: Not on file  Transportation Needs: Not on file  Physical Activity: Not on file  Stress: Not on file  Social Connections: Not on file  Intimate Partner Violence: Not on file    Family History  Problem Relation Age of Onset   Breast cancer Mother    Hip fracture Mother    Ulcers Father    CVA Brother    Renal cancer Brother    Other Brother        hip replacement   CVA Brother        Had PaceMaker   Crohn's disease Brother    Diabetes Brother      Review of Systems  Constitutional: Negative.  Negative for chills and fever.  HENT: Negative.  Negative for congestion and sore throat.   Respiratory: Negative.  Negative for cough and shortness of breath.   Cardiovascular: Negative.  Negative for chest pain and palpitations.  Gastrointestinal: Negative.  Negative for abdominal pain, nausea and vomiting.  Genitourinary: Negative.  Negative for dysuria and hematuria.  Skin: Negative.  Negative for rash.  Neurological: Negative.  Negative for dizziness and headaches.  All other systems reviewed and are negative.   Today's Vitals   03/14/22 0825  BP: (!) 144/78  Pulse: 78  Temp: 97.9 F (36.6 C)  TempSrc: Oral  SpO2: 96%  Weight: 166  lb 4 oz (75.4 kg)  Height: '5\' 5"'$  (1.651 m)   Body mass index is 27.67 kg/m.  Physical Exam Vitals reviewed.  Constitutional:      Appearance: Normal appearance.  HENT:     Head: Normocephalic.     Mouth/Throat:     Mouth: Mucous membranes are moist.     Pharynx: Oropharynx is clear.  Eyes:     Extraocular Movements: Extraocular movements intact.     Conjunctiva/sclera: Conjunctivae normal.     Pupils: Pupils are equal, round, and reactive to light.  Cardiovascular:     Rate and Rhythm: Normal rate and regular rhythm.     Pulses: Normal pulses.     Heart sounds: Normal heart sounds.  Pulmonary:     Effort: Pulmonary effort is normal.     Breath sounds: Normal breath sounds.  Musculoskeletal:     Cervical back: No tenderness.     Right lower leg: No edema.     Left lower leg: No edema.  Lymphadenopathy:     Cervical: No cervical adenopathy.  Skin:    General: Skin is warm and dry.     Capillary Refill: Capillary refill takes less than 2 seconds.  Neurological:     General: No focal deficit present.     Mental Status: She is alert and oriented to person, place, and time.      ASSESSMENT & PLAN: A total of 65 minutes was spent with the patient and counseling/coordination of care regarding preparing for this visit, establishing care with me, review of available medical records, review of most recent cardiologist office visit notes, review of multiple chronic medical problems and their management, review of all medications, comprehensive history and physical examination, education on nutrition, prognosis, documentation, and need for follow-up.  Problem List Items Addressed This Visit       Cardiovascular and Mediastinum   Atrial fibrillation ([place])    Well-controlled.  Continue beta-blocker metoprolol tartrate 25 mg twice a day. Continue daily Eliquis 5 mg twice a  day. Fall precautions given. Advised against NSAIDs.  Patient aware of this.      Relevant Orders   CBC  with Differential/Platelet   Essential hypertension - Primary    Well-controlled hypertension. Continue losartan 100 mg and hydrochlorothiazide 25 mg daily. Cardiovascular risks associated with hypertension discussed. No history of congestive heart failure.      Relevant Orders   CBC with Differential/Platelet   Comprehensive metabolic panel   Hemoglobin A1c   Lipid panel   Complete heart block (HCC)    Stable with recent permanent pacemaker insertion.  Sees cardiologist on a regular basis.        Digestive   Gastro-esophageal reflux disease without esophagitis    Very well controlled with pantoprazole 40 mg daily.        Endocrine   Impaired fasting glucose    No history of diabetes.  No diabetic medications.        Genitourinary   Chronic kidney disease, stage 3a ([place])    Advised to stay well-hydrated and avoid NSAIDs.      Relevant Orders   Comprehensive metabolic panel     Other   Dyslipidemia    Stable.  Diet and nutrition discussed.  Sensitive to statins.  Advised by cardiologist to stay off statins.      Relevant Orders   Comprehensive metabolic panel   Hemoglobin A1c   Lipid panel   Other Visit Diagnoses     Need for vaccination       Relevant Orders   Pneumococcal conjugate vaccine 20-valent (Prevnar 20) (Completed)   Encounter to establish care          Patient Instructions  Health Maintenance After Age 81 After age 16, you are at a higher risk for certain long-term diseases and infections as well as injuries from falls. Falls are a major cause of broken bones and head injuries in people who are older than age 66. Getting regular preventive care can help to keep you healthy and well. Preventive care includes getting regular testing and making lifestyle changes as recommended by your health care provider. Talk with your health care provider about: Which screenings and tests you should have. A screening is a test that checks for a disease when you  have no symptoms. A diet and exercise plan that is right for you. What should I know about screenings and tests to prevent falls? Screening and testing are the best ways to find a health problem early. Early diagnosis and treatment give you the best chance of managing medical conditions that are common after age 26. Certain conditions and lifestyle choices may make you more likely to have a fall. Your health care provider may recommend: Regular vision checks. Poor vision and conditions such as cataracts can make you more likely to have a fall. If you wear glasses, make sure to get your prescription updated if your vision changes. Medicine review. Work with your health care provider to regularly review all of the medicines you are taking, including over-the-counter medicines. Ask your health care provider about any side effects that may make you more likely to have a fall. Tell your health care provider if any medicines that you take make you feel dizzy or sleepy. Strength and balance checks. Your health care provider may recommend certain tests to check your strength and balance while standing, walking, or changing positions. Foot health exam. Foot pain and numbness, as well as not wearing proper footwear, can make you more likely to  have a fall. Screenings, including: Osteoporosis screening. Osteoporosis is a condition that causes the bones to get weaker and break more easily. Blood pressure screening. Blood pressure changes and medicines to control blood pressure can make you feel dizzy. Depression screening. You may be more likely to have a fall if you have a fear of falling, feel depressed, or feel unable to do activities that you used to do. Alcohol use screening. Using too much alcohol can affect your balance and may make you more likely to have a fall. Follow these instructions at home: Lifestyle Do not drink alcohol if: Your health care provider tells you not to drink. If you drink  alcohol: Limit how much you have to: 0-1 drink a day for women. 0-2 drinks a day for men. Know how much alcohol is in your drink. In the [place] one drink equals one 12 oz bottle of beer (355 mL), one 5 oz glass of wine (148 mL), or one 1 oz glass of hard liquor (44 mL). Do not use any products that contain nicotine or tobacco. These products include cigarettes, chewing tobacco, and vaping devices, such as e-cigarettes. If you need help quitting, ask your health care provider. Activity  Follow a regular exercise program to stay fit. This will help you maintain your balance. Ask your health care provider what types of exercise are appropriate for you. If you need a cane or walker, use it as recommended by your health care provider. Wear supportive shoes that have nonskid soles. Safety  Remove any tripping hazards, such as rugs, cords, and clutter. Install safety equipment such as grab bars in bathrooms and safety rails on stairs. Keep rooms and walkways well-lit. General instructions Talk with your health care provider about your risks for falling. Tell your health care provider if: You fall. Be sure to tell your health care provider about all falls, even ones that seem minor. You feel dizzy, tiredness (fatigue), or off-balance. Take over-the-counter and prescription medicines only as told by your health care provider. These include supplements. Eat a healthy diet and maintain a healthy weight. A healthy diet includes low-fat dairy products, low-fat (lean) meats, and fiber from whole grains, beans, and lots of fruits and vegetables. Stay current with your vaccines. Schedule regular health, dental, and eye exams. Summary Having a healthy lifestyle and getting preventive care can help to protect your health and wellness after age 63. Screening and testing are the best way to find a health problem early and help you avoid having a fall. Early diagnosis and treatment give you the best chance for  managing medical conditions that are more common for people who are older than age 86. Falls are a major cause of broken bones and head injuries in people who are older than age 55. Take precautions to prevent a fall at home. Work with your health care provider to learn what changes you can make to improve your health and wellness and to prevent falls. This information is not intended to replace advice given to you by your health care provider. Make sure you discuss any questions you have with your health care provider. Document Revised: 01/30/2021 Document Reviewed: 01/30/2021 Elsevier Patient Education  [place], MD [facility]

## 2022-03-14 NOTE — Assessment & Plan Note (Signed)
Stable.  Diet and nutrition discussed.  Sensitive to statins.  Advised by cardiologist to stay off statins.

## 2022-03-14 NOTE — Assessment & Plan Note (Signed)
Very well controlled with pantoprazole 40 mg daily.

## 2022-03-14 NOTE — Assessment & Plan Note (Signed)
Advised to stay well-hydrated and avoid NSAIDs. ?

## 2022-03-14 NOTE — Assessment & Plan Note (Signed)
No history of diabetes.  No diabetic medications.

## 2022-03-14 NOTE — Patient Instructions (Signed)
Health Maintenance After Age 80 After age 80, you are at a higher risk for certain long-term diseases and infections as well as injuries from falls. Falls are a major cause of broken bones and head injuries in people who are older than age 80. Getting regular preventive care can help to keep you healthy and well. Preventive care includes getting regular testing and making lifestyle changes as recommended by your health care provider. Talk with your health care provider about: Which screenings and tests you should have. A screening is a test that checks for a disease when you have no symptoms. A diet and exercise plan that is right for you. What should I know about screenings and tests to prevent falls? Screening and testing are the best ways to find a health problem early. Early diagnosis and treatment give you the best chance of managing medical conditions that are common after age 80. Certain conditions and lifestyle choices may make you more likely to have a fall. Your health care provider may recommend: Regular vision checks. Poor vision and conditions such as cataracts can make you more likely to have a fall. If you wear glasses, make sure to get your prescription updated if your vision changes. Medicine review. Work with your health care provider to regularly review all of the medicines you are taking, including over-the-counter medicines. Ask your health care provider about any side effects that may make you more likely to have a fall. Tell your health care provider if any medicines that you take make you feel dizzy or sleepy. Strength and balance checks. Your health care provider may recommend certain tests to check your strength and balance while standing, walking, or changing positions. Foot health exam. Foot pain and numbness, as well as not wearing proper footwear, can make you more likely to have a fall. Screenings, including: Osteoporosis screening. Osteoporosis is a condition that causes  the bones to get weaker and break more easily. Blood pressure screening. Blood pressure changes and medicines to control blood pressure can make you feel dizzy. Depression screening. You may be more likely to have a fall if you have a fear of falling, feel depressed, or feel unable to do activities that you used to do. Alcohol use screening. Using too much alcohol can affect your balance and may make you more likely to have a fall. Follow these instructions at home: Lifestyle Do not drink alcohol if: Your health care provider tells you not to drink. If you drink alcohol: Limit how much you have to: 0-1 drink a day for women. 0-2 drinks a day for men. Know how much alcohol is in your drink. In the [place] one drink equals one 12 oz bottle of beer (355 mL), one 5 oz glass of wine (148 mL), or one 1 oz glass of hard liquor (44 mL). Do not use any products that contain nicotine or tobacco. These products include cigarettes, chewing tobacco, and vaping devices, such as e-cigarettes. If you need help quitting, ask your health care provider. Activity  Follow a regular exercise program to stay fit. This will help you maintain your balance. Ask your health care provider what types of exercise are appropriate for you. If you need a cane or walker, use it as recommended by your health care provider. Wear supportive shoes that have nonskid soles. Safety  Remove any tripping hazards, such as rugs, cords, and clutter. Install safety equipment such as grab bars in bathrooms and safety rails on stairs. Keep rooms and walkways   well-lit. General instructions Talk with your health care provider about your risks for falling. Tell your health care provider if: You fall. Be sure to tell your health care provider about all falls, even ones that seem minor. You feel dizzy, tiredness (fatigue), or off-balance. Take over-the-counter and prescription medicines only as told by your health care provider. These include  supplements. Eat a healthy diet and maintain a healthy weight. A healthy diet includes low-fat dairy products, low-fat (lean) meats, and fiber from whole grains, beans, and lots of fruits and vegetables. Stay current with your vaccines. Schedule regular health, dental, and eye exams. Summary Having a healthy lifestyle and getting preventive care can help to protect your health and wellness after age 80. Screening and testing are the best way to find a health problem early and help you avoid having a fall. Early diagnosis and treatment give you the best chance for managing medical conditions that are more common for people who are older than age 80. Falls are a major cause of broken bones and head injuries in people who are older than age 80. Take precautions to prevent a fall at home. Work with your health care provider to learn what changes you can make to improve your health and wellness and to prevent falls. This information is not intended to replace advice given to you by your health care provider. Make sure you discuss any questions you have with your health care provider. Document Revised: 01/30/2021 Document Reviewed: 01/30/2021 Elsevier Patient Education  2023 Elsevier Inc.  

## 2022-03-14 NOTE — Assessment & Plan Note (Signed)
Well-controlled.  Continue beta-blocker metoprolol tartrate 25 mg twice a day. Continue daily Eliquis 5 mg twice a day. Fall precautions given. Advised against NSAIDs.  Patient aware of this.

## 2022-03-14 NOTE — Assessment & Plan Note (Signed)
Stable with recent permanent pacemaker insertion.  Sees cardiologist on a regular basis.

## 2022-03-19 ENCOUNTER — Encounter: Payer: Self-pay | Admitting: Internal Medicine

## 2022-03-19 ENCOUNTER — Ambulatory Visit: Payer: Medicare Other | Admitting: Internal Medicine

## 2022-03-19 VITALS — BP 136/88 | HR 60 | Ht 65.0 in | Wt 167.2 lb

## 2022-03-19 DIAGNOSIS — I442 Atrioventricular block, complete: Secondary | ICD-10-CM | POA: Diagnosis not present

## 2022-03-24 ENCOUNTER — Ambulatory Visit: Payer: Medicare Other | Admitting: Cardiology

## 2022-03-28 DIAGNOSIS — I4891 Unspecified atrial fibrillation: Secondary | ICD-10-CM | POA: Diagnosis not present

## 2022-03-28 DIAGNOSIS — Z79899 Other long term (current) drug therapy: Secondary | ICD-10-CM | POA: Diagnosis not present

## 2022-03-28 DIAGNOSIS — D649 Anemia, unspecified: Secondary | ICD-10-CM | POA: Diagnosis not present

## 2022-03-28 DIAGNOSIS — K219 Gastro-esophageal reflux disease without esophagitis: Secondary | ICD-10-CM | POA: Diagnosis not present

## 2022-03-28 DIAGNOSIS — R112 Nausea with vomiting, unspecified: Secondary | ICD-10-CM | POA: Diagnosis not present

## 2022-03-28 DIAGNOSIS — Z950 Presence of cardiac pacemaker: Secondary | ICD-10-CM | POA: Diagnosis not present

## 2022-03-30 DIAGNOSIS — H52223 Regular astigmatism, bilateral: Secondary | ICD-10-CM | POA: Diagnosis not present

## 2022-03-30 DIAGNOSIS — Z961 Presence of intraocular lens: Secondary | ICD-10-CM | POA: Diagnosis not present

## 2022-03-30 DIAGNOSIS — H18513 Endothelial corneal dystrophy, bilateral: Secondary | ICD-10-CM | POA: Diagnosis not present

## 2022-03-30 DIAGNOSIS — H26493 Other secondary cataract, bilateral: Secondary | ICD-10-CM | POA: Diagnosis not present

## 2022-03-30 NOTE — Progress Notes (Unsigned)
Cardiology Office Note:    Date:  03/30/2022   ID:  Lisa [name], DOB 1941-01-20, MRN [id number]  PCP:  Horald Pollen, [place] Cardiologist: Somer Trotter [name], MD   Reason for visit: Follow-up ED visit for HTN  History of Present Illness:    Lisa [name] is a 81 y.o. female with a hx of atrial fibrillation s/p DCCV in 07/2018 (sx SOB, swelling, indigestion), hypertension, hyperlipidemia, left bundle branch block.   Myoview in January 2019 was normal.  She last saw Dr. [name] on June 19, 2021 and was doing well.    She went to [facility] and complained of sudden right shoulder pain along with nausea and one episode of vomiting.  She took aspirin and her symptoms improved.  Troponin unremarkable.  Chest x-ray with mild enlargement of the cardiomediastinal silhouette and borderline pulmonary venous congestion. Pain felt to be musculoskeletal related to fall.   Recently she was seen in ED at [facility]. She had an episode of syncope at home. Following this she had acute N/V and diarrhea. BP was quite high. Ecg showed NSR with chronic LBBB. BP was quite high to 202/110. Labs including troponins were negative. She hasn't had any further dizziness or syncope but has persistent elevated BP.  She was on metoprolol, losartan and HCTZ.  When seen in Feb she was in NSR. Later presented in March with recurrent Afib. She underwent successful DCCV. 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 She was admitted and underwent placement of dual chamber pacemaker by Dr Lovena Le. Echo was unremarkable. She was DC on metoprolol and Eliquis. Prior renal artery duplex was negative for RAS.   Pacemaker follow up in June was normal.   On follow up she is very appreciative of the care from Dr Percival Spanish and Dr Caryl Comes. She denies any chest pain or dizziness. No syncope. No palpitations. Pacer site is  healing well.  For 2 weeks after her hospital stay she had nausea and vomiting every morning after her pills. She stopped taking simvastatin and her symptoms resolved. BP at home ranges 130-158/61-86.      Past Medical History:  Diagnosis Date   Bundle branch block, left    Dyslipidemia    GERD (gastroesophageal reflux disease)    Hypercholesteremia    Hypertension    LBBB (left bundle branch block)    UTI (urinary tract infection)     Past Surgical History:  Procedure Laterality Date   bladder polyp removal     BREAST EXCISIONAL BIOPSY Right    60 yrs ago- Benign   BREAST LUMPECTOMY Left    No Visible Scar, said 30 + years ago in situ, no chemo, no radiation, or hormone replacement     CARDIOVERSION N/A 07/30/2018   Procedure: CARDIOVERSION;  Surgeon: Jerline Pain, MD;  Location: [facility] ENDOSCOPY;  Service: Cardiovascular;  Laterality: N/A;   CARDIOVERSION N/A 11/28/2021   Procedure: CARDIOVERSION;  Surgeon: Evans Lance, MD;  Location: [place] CV LAB;  Service: Cardiovascular;  Laterality: N/A;   CESAREAN SECTION     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   Procedure: PACEMAKER IMPLANT;  Surgeon: Evans Lance, MD;  Location: [place] CV LAB;  Service: Cardiovascular;  Laterality: N/A;    Current Medications: No outpatient medications have been marked as taking for the 04/02/22 encounter (Appointment) with [name], Bria Portales M, MD.  Allergies:   Codeine, Amlodipine, Fosamax [alendronate sodium], Lisinopril, and Olmesartan   Social History   Socioeconomic History   Marital status: Married    Spouse name: Not on file   Number of children: 2   Years of education: Not on file   Highest education level: Not on file  Occupational History   Not on file  Tobacco Use   Smoking status: Former    Years: 20.00    Types: Cigarettes    Quit date: 40    Years since quitting: 30.5   Smokeless tobacco: Never  Vaping Use   Vaping Use: Never used  Substance and Sexual Activity    Alcohol use: Yes   Drug use: No   Sexual activity: Not on file  Other Topics Concern   Not on file  Social History Narrative   Not on file   Social Determinants of Health   Financial Resource Strain: Not on file  Food Insecurity: Not on file  Transportation Needs: Not on file  Physical Activity: Not on file  Stress: Not on file  Social Connections: Not on file     Family History: The patient's family history includes Breast cancer in her mother; CVA in her brother and brother; Crohn's disease in her brother; Diabetes in her brother; Hip fracture in her mother; Other in her brother; Renal cancer in her brother; Ulcers in her father.  ROS:   Please see the history of present illness.     EKGs/Labs/Other Studies Reviewed:    EKG:  The ekg today shows atrial pacing with V sensing. LBBB. Rate 60. I have personally reviewed and interpreted this study.   Recent Labs: 12/02/2021: TSH 2.480 12/03/2021: Magnesium 1.7 03/14/2022: ALT 17; BUN 18; Creatinine, Ser 1.08; Hemoglobin 12.6; Platelets 274.0; Potassium 3.9; Sodium 137   Recent Lipid Panel Lab Results  Component Value Date/Time   CHOL 238 (H) 03/14/2022 09:07 AM   TRIG 150.0 (H) 03/14/2022 09:07 AM   HDL 61.90 03/14/2022 09:07 AM   LDLCALC 146 (H) 03/14/2022 09:07 AM    Dated 06/03/20: cholesterol 159, triglycerides 94, HDL 59, LDL 83. Dated 06/15/21: A1c 6.3, Hgb 12.5. chemistries and TSH normal. Dated 11/15/21:A1c 6.3%   Echo 11/30/21: IMPRESSIONS     1. Left ventricular ejection fraction, by estimation, is 60 to 65%. Left  ventricular ejection fraction by 3D volume is 66 %. The left ventricle has  normal function. The left ventricle has no regional wall motion  abnormalities. Left ventricular diastolic   parameters are consistent with Grade III diastolic dysfunction  (restrictive). Elevated left ventricular end-diastolic pressure.   2. Right ventricular systolic function is normal. The right ventricular  size is  mildly enlarged. There is normal pulmonary artery systolic  pressure. The estimated right ventricular systolic pressure is 10.1 mmHg.   3. Left atrial size was moderately dilated.   4. Right atrial size was mildly dilated.   5. The mitral valve is normal in structure. No evidence of mitral valve  regurgitation. No evidence of mitral stenosis. Severe mitral annular  calcification.   6. The aortic valve is tricuspid. Aortic valve regurgitation is not  visualized. Aortic valve sclerosis/calcification is present, without any  evidence of aortic stenosis.   7. Aortic dilatation noted. There is borderline dilatation of the  ascending aorta, measuring 37 mm.   8. The inferior vena cava is normal in size with greater than 50%  respiratory variability, suggesting right atrial pressure of 3 mmHg.   Physical Exam:  VS:  There were no vitals taken for this visit.   No data found.  Wt Readings from Last 3 Encounters:  03/19/22 167 lb 3.2 oz (75.8 kg)  03/14/22 166 lb 4 oz (75.4 kg)  02/02/22 166 lb 12.8 oz (75.7 kg)     GEN:  Well nourished, well developed in no acute distress HEENT: Normal NECK: No JVD; No carotid bruits CARDIAC: RRR, no murmurs, rubs, gallops RESPIRATORY:  Clear to auscultation without rales, wheezing or rhonchi  ABDOMEN: Soft, non-tender, non-distended MUSCULOSKELETAL: No edema; No deformity  SKIN: Warm and dry NEUROLOGIC:  Alert and oriented PSYCHIATRIC:  Normal affect     ASSESSMENT AND PLAN    HTN resistant. Essential.  -will continue losartan, HCTZ and increase metoprolol to 25 mg bid.  - Dash diet.  2. Paroxysmal atrial fibrillation -Status post DCCV in November 2019 -Echo showed mild LV dysfunction with dyssynergy due to LBBB. Moderate LAE.   - more recent recurrence s/p successful DCCV.  - in Apaced rhthym now.  -Continue Eliquis for stroke prevention and metoprolol for rate control.   - recent Echo showed normal LV function  3. LBBB chronic.    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   5. HLD. Recent N/V resolved with stopping simvastatin. No known history of vascular disease. Will stay off statin and reassess lipids in 3 months.     Disposition - Follow-up in HTN clinic in 3 weeks. Follow up with me 3 months.        Medication Adjustments/Labs and Tests Ordered: Current medicines are reviewed at length with the patient today.  Concerns regarding medicines are outlined above.  No orders of the defined types were placed in this encounter.  No orders of the defined types were placed in this encounter.   There are no Patient Instructions on file for this visit.   Signed, Floy Angert [name], MD  03/30/2022 2:21 PM    [place]

## 2022-04-02 ENCOUNTER — Encounter: Payer: Self-pay | Admitting: Cardiology

## 2022-04-02 ENCOUNTER — Ambulatory Visit: Payer: Medicare Other | Admitting: Cardiology

## 2022-04-02 ENCOUNTER — Ambulatory Visit: Payer: Medicare Other | Admitting: Plastic Surgery

## 2022-04-02 ENCOUNTER — Encounter: Payer: Self-pay | Admitting: Plastic Surgery

## 2022-04-02 VITALS — BP 160/75 | HR 60 | Ht 65.0 in | Wt 165.0 lb

## 2022-04-02 VITALS — BP 138/64 | HR 60 | Ht 65.0 in | Wt 166.8 lb

## 2022-04-02 DIAGNOSIS — N750 Cyst of Bartholin's gland: Secondary | ICD-10-CM | POA: Diagnosis not present

## 2022-04-02 DIAGNOSIS — I10 Essential (primary) hypertension: Secondary | ICD-10-CM | POA: Diagnosis not present

## 2022-04-02 DIAGNOSIS — I447 Left bundle-branch block, unspecified: Secondary | ICD-10-CM

## 2022-04-02 DIAGNOSIS — I480 Paroxysmal atrial fibrillation: Secondary | ICD-10-CM | POA: Diagnosis not present

## 2022-04-02 DIAGNOSIS — I442 Atrioventricular block, complete: Secondary | ICD-10-CM

## 2022-04-02 DIAGNOSIS — E7800 Pure hypercholesterolemia, unspecified: Secondary | ICD-10-CM

## 2022-04-02 NOTE — Progress Notes (Signed)
Procedure Note  Preoperative Dx: sebaceous / bartholin cyst  Postoperative Dx: Same  Procedure: excision of sebaceous / bartholin cyst 1.5 cm  Anesthesia: Lidocaine 1% with 1:100,000 epinephrine  Indication for Procedure: cyst  Description of Procedure: Risks and complications were explained to the patient.  Consent was confirmed and the patient understands the risks and benefits.  The potential complications and alternatives were explained and the patient consents.  The patient expressed understanding the option of not having the procedure and the risks of a scar.  Time out was called and all information was confirmed to be correct.    The area was prepped and drapped.  Lidocaine 1% with epinepherine was injected in the subcutaneous area.  After waiting several minutes for the local to take affect a #15 blade was used to incise the skin over the area. The skin edges were reapproximated with 5-0 Vicryl running closure.  A dressing was applied.  The patient was given instructions on how to care for the area and a follow up appointment.  Lisa [name] tolerated the procedure well and there were no complications. Patient did not want the specimen sent to pathology.

## 2022-04-03 ENCOUNTER — Ambulatory Visit: Payer: Medicare Other | Admitting: Plastic Surgery

## 2022-04-04 DIAGNOSIS — H5213 Myopia, bilateral: Secondary | ICD-10-CM | POA: Diagnosis not present

## 2022-04-13 DIAGNOSIS — H52223 Regular astigmatism, bilateral: Secondary | ICD-10-CM | POA: Diagnosis not present

## 2022-04-13 DIAGNOSIS — H524 Presbyopia: Secondary | ICD-10-CM | POA: Diagnosis not present

## 2022-04-17 ENCOUNTER — Ambulatory Visit (INDEPENDENT_AMBULATORY_CARE_PROVIDER_SITE_OTHER): Payer: Medicare Other | Admitting: Physician Assistant

## 2022-04-17 DIAGNOSIS — N750 Cyst of Bartholin's gland: Secondary | ICD-10-CM

## 2022-04-17 NOTE — Progress Notes (Signed)
This is a pleasant 81 year old female seen in our office for follow-up evaluation status post excision of sebaceous cyst on 04/02/2022.  The patient notes that she tolerated procedure well, she denies any issues or complications with her incision site.  On exam the incision is clean dry and intact with dissolvable sutures in place with no signs of infection.  They were trimmed and removed without complication today.  The patient notes that she also has a lesion on her face (photo in chart), she plans on following up with her dermatologist and will review with them if any treatment indicated, if not she will follow-up in our office for potential excision.  She will reach out to our office when she speaks with her dermatologist.  She also expressed interest in fillers for a defect in the left cheek, she would like to discuss this further after addressing the left cheek lesion.  The patient was given strict return precautions she verbalized understanding and agreement to today's plan had no further questions or concerns.

## 2022-06-05 ENCOUNTER — Ambulatory Visit (INDEPENDENT_AMBULATORY_CARE_PROVIDER_SITE_OTHER): Payer: Medicare Other

## 2022-06-05 DIAGNOSIS — I442 Atrioventricular block, complete: Secondary | ICD-10-CM

## 2022-06-08 LAB — CUP PACEART REMOTE DEVICE CHECK
Date Time Interrogation Session: 20230912112214
Implantable Lead Implant Date: 20230313
Implantable Lead Implant Date: 20230313
Implantable Lead Location: 753859
Implantable Lead Location: 753860
Implantable Lead Model: 377171
Implantable Lead Model: 377171
Implantable Lead Serial Number: 7000407809
Implantable Lead Serial Number: 8000780983
Implantable Pulse Generator Implant Date: 20230313
Pulse Gen Model: 407145
Pulse Gen Serial Number: 70364045

## 2022-06-12 ENCOUNTER — Telehealth: Payer: Self-pay

## 2022-06-12 NOTE — Telephone Encounter (Signed)
Pt is wondering if she needs an additional Pneumonia vaccine. Pt has had the following: Pneumococcal Conjugate-13 08/18/2014 Pneumonia Conjugate -20 03/14/2022 Pneumococcal Polysaccharide-23 04/20/2009  Please advise

## 2022-06-12 NOTE — Telephone Encounter (Signed)
Called patient to inform her that she does not need an additional pneumonia vaccine as she has completed the series

## 2022-06-12 NOTE — Telephone Encounter (Signed)
No.  Thanks

## 2022-06-18 DIAGNOSIS — L821 Other seborrheic keratosis: Secondary | ICD-10-CM | POA: Diagnosis not present

## 2022-06-18 DIAGNOSIS — L820 Inflamed seborrheic keratosis: Secondary | ICD-10-CM | POA: Diagnosis not present

## 2022-06-18 DIAGNOSIS — L814 Other melanin hyperpigmentation: Secondary | ICD-10-CM | POA: Diagnosis not present

## 2022-06-18 DIAGNOSIS — D485 Neoplasm of uncertain behavior of skin: Secondary | ICD-10-CM | POA: Diagnosis not present

## 2022-06-18 DIAGNOSIS — Z85828 Personal history of other malignant neoplasm of skin: Secondary | ICD-10-CM | POA: Diagnosis not present

## 2022-06-18 DIAGNOSIS — L578 Other skin changes due to chronic exposure to nonionizing radiation: Secondary | ICD-10-CM | POA: Diagnosis not present

## 2022-06-20 ENCOUNTER — Other Ambulatory Visit: Payer: Self-pay | Admitting: Emergency Medicine

## 2022-06-20 DIAGNOSIS — Z1231 Encounter for screening mammogram for malignant neoplasm of breast: Secondary | ICD-10-CM

## 2022-06-21 NOTE — Progress Notes (Signed)
Remote pacemaker transmission.   

## 2022-08-20 ENCOUNTER — Ambulatory Visit
Admission: RE | Admit: 2022-08-20 | Discharge: 2022-08-20 | Disposition: A | Discharge status: Home / Self Care | Payer: Medicare Other | Source: Ambulatory Visit | Attending: Emergency Medicine | Admitting: Emergency Medicine

## 2022-08-20 DIAGNOSIS — Z1231 Encounter for screening mammogram for malignant neoplasm of breast: Secondary | ICD-10-CM

## 2022-09-04 ENCOUNTER — Ambulatory Visit (INDEPENDENT_AMBULATORY_CARE_PROVIDER_SITE_OTHER): Payer: Medicare Other

## 2022-09-04 DIAGNOSIS — I442 Atrioventricular block, complete: Secondary | ICD-10-CM | POA: Diagnosis not present

## 2022-09-04 LAB — CUP PACEART REMOTE DEVICE CHECK
Date Time Interrogation Session: 20231212075331
Implantable Lead Connection Status: 753985
Implantable Lead Connection Status: 753985
Implantable Lead Implant Date: 20230313
Implantable Lead Implant Date: 20230313
Implantable Lead Location: 753859
Implantable Lead Location: 753860
Implantable Lead Model: 377171
Implantable Lead Model: 377171
Implantable Lead Serial Number: 7000407809
Implantable Lead Serial Number: 8000780983
Implantable Pulse Generator Implant Date: 20230313
Pulse Gen Model: 407145
Pulse Gen Serial Number: 70364045

## 2022-09-04 NOTE — Progress Notes (Signed)
Cardiology Office Note:    Date:  09/10/2022   ID:  Lisa [name], DOB 08/12/1941, MRN [id number]  PCP:  Horald Pollen, [place] Cardiologist: Lisa Mcjunkins [name], MD   Reason for visit: Follow-up ED visit for HTN  History of Present Illness:    Lisa [name] is a 81 y.o. female with a hx of atrial fibrillation s/p DCCV in 07/2018 (sx SOB, swelling, indigestion), hypertension, hyperlipidemia, left bundle branch block.   Myoview in January 2019 was normal.  She last saw Dr. [name] on June 19, 2021 and was doing well.    She went to [facility] and complained of sudden right shoulder pain along with nausea and one episode of vomiting.  She took aspirin and her symptoms improved.  Troponin unremarkable.  Chest x-ray with mild enlargement of the cardiomediastinal silhouette and borderline pulmonary venous congestion. Pain felt to be musculoskeletal related to fall.   She was seen in ED at [facility] in Jan 2023. She had an episode of syncope at home. Following this she had acute N/V and diarrhea. BP was quite high. Ecg showed NSR with chronic LBBB. BP was quite high to 202/110. Labs including troponins were negative. She hasn't had any further dizziness or syncope but has persistent elevated BP.  She was on metoprolol, losartan and HCTZ.  When seen in Feb she was in NSR. Later presented in March with recurrent Afib. She underwent successful DCCV. 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 She was admitted and underwent placement of dual chamber pacemaker by Dr Lovena Le. Echo was unremarkable. She was DC on metoprolol and Eliquis. Prior renal artery duplex was negative for RAS.   Pacemaker follow up in June was normal.   On follow up today she is doing very well. No palpitations, dizziness, belching or SOB. Tolerating medication well. BP has been under good control. She feels well.  On pacer check no Afib detected.      Past Medical History:  Diagnosis Date   Bundle branch block, left    Dyslipidemia    GERD (gastroesophageal reflux disease)    Hypercholesteremia    Hypertension    LBBB (left bundle branch block)    UTI (urinary tract infection)     Past Surgical History:  Procedure Laterality Date   bladder polyp removal     BREAST EXCISIONAL BIOPSY Right    60 yrs ago- Benign   BREAST LUMPECTOMY Left    No Visible Scar, said 30 + years ago in situ, no chemo, no radiation, or hormone replacement     CARDIOVERSION N/A 07/30/2018   Procedure: CARDIOVERSION;  Surgeon: Jerline Pain, MD;  Location: [facility] ENDOSCOPY;  Service: Cardiovascular;  Laterality: N/A;   CARDIOVERSION N/A 11/28/2021   Procedure: CARDIOVERSION;  Surgeon: Evans Lance, MD;  Location: [place] CV LAB;  Service: Cardiovascular;  Laterality: N/A;   CESAREAN SECTION     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   Procedure: PACEMAKER IMPLANT;  Surgeon: Evans Lance, MD;  Location: [place] CV LAB;  Service: Cardiovascular;  Laterality: N/A;    Current Medications: Current Meds  Medication Sig   Cholecalciferol (VITAMIN D3) 50 MCG (2000 UT) TABS Take 2,000 Units by mouth daily.   ELIQUIS 5 MG TABS tablet TAKE 1 TABLET BY MOUTH TWICE DAILY   fluorouracil (EFUDEX) 5 % cream 1 application Externally once a night for 14  days   hydrochlorothiazide (HYDRODIURIL) 25 MG tablet Take 1 tablet (25 mg total) by mouth daily.   losartan (COZAAR) 100 MG tablet TAKE 1 TABLET BY MOUTH EVERY DAY (Patient taking differently: Take 100 mg by mouth daily.)   metoprolol tartrate (LOPRESSOR) 25 MG tablet Take 1 tablet (25 mg total) by mouth 2 (two) times daily.   pantoprazole (PROTONIX) 20 MG tablet Take 20 mg by mouth daily as needed (gerd/heartburn).   Polyethyl Glycol-Propyl Glycol (SYSTANE) 0.4-0.3 % SOLN Place 1-2 drops into both eyes 3 (three) times daily as needed (dry/irritated eyes.).   PREVIDENT 5000  BOOSTER PLUS 1.1 % PSTE Place onto teeth daily.     Allergies:   Codeine, Amlodipine, Fosamax [alendronate sodium], Lisinopril, and Olmesartan   Social History   Socioeconomic History   Marital status: Married    Spouse name: Not on file   Number of children: 2   Years of education: Not on file   Highest education level: Not on file  Occupational History   Not on file  Tobacco Use   Smoking status: Former    Years: 20.00    Types: Cigarettes    Quit date: 106    Years since quitting: 30.9   Smokeless tobacco: Never  Vaping Use   Vaping Use: Never used  Substance and Sexual Activity   Alcohol use: Yes   Drug use: No   Sexual activity: Not on file  Other Topics Concern   Not on file  Social History Narrative   Not on file   Social Determinants of Health   Financial Resource Strain: Not on file  Food Insecurity: Not on file  Transportation Needs: Not on file  Physical Activity: Not on file  Stress: Not on file  Social Connections: Not on file     Family History: The patient's family history includes Breast cancer in her mother; CVA in her brother and brother; Crohn's disease in her brother; Diabetes in her brother; Hip fracture in her mother; Other in her brother; Renal cancer in her brother; Ulcers in her father.  ROS:   Please see the history of present illness.     EKGs/Labs/Other Studies Reviewed:    EKG:  none today   Recent Labs: 12/02/2021: TSH 2.480 12/03/2021: Magnesium 1.7 03/14/2022: ALT 17; BUN 18; Creatinine, Ser 1.08; Hemoglobin 12.6; Platelets 274.0; Potassium 3.9; Sodium 137   Recent Lipid Panel Lab Results  Component Value Date/Time   CHOL 238 (H) 03/14/2022 09:07 AM   TRIG 150.0 (H) 03/14/2022 09:07 AM   HDL 61.90 03/14/2022 09:07 AM   LDLCALC 146 (H) 03/14/2022 09:07 AM    Dated 06/03/20: cholesterol 159, triglycerides 94, HDL 59, LDL 83. Dated 06/15/21: A1c 6.3, Hgb 12.5. chemistries and TSH normal. Dated 11/15/21:A1c 6.3%   Echo  11/30/21: IMPRESSIONS     1. Left ventricular ejection fraction, by estimation, is 60 to 65%. Left  ventricular ejection fraction by 3D volume is 66 %. The left ventricle has  normal function. The left ventricle has no regional wall motion  abnormalities. Left ventricular diastolic   parameters are consistent with Grade III diastolic dysfunction  (restrictive). Elevated left ventricular end-diastolic pressure.   2. Right ventricular systolic function is normal. The right ventricular  size is mildly enlarged. There is normal pulmonary artery systolic  pressure. The estimated right ventricular systolic pressure is 94.8 mmHg.   3. Left atrial size was moderately dilated.   4. Right atrial size was mildly dilated.   5. The  mitral valve is normal in structure. No evidence of mitral valve  regurgitation. No evidence of mitral stenosis. Severe mitral annular  calcification.   6. The aortic valve is tricuspid. Aortic valve regurgitation is not  visualized. Aortic valve sclerosis/calcification is present, without any  evidence of aortic stenosis.   7. Aortic dilatation noted. There is borderline dilatation of the  ascending aorta, measuring 37 mm.   8. The inferior vena cava is normal in size with greater than 50%  respiratory variability, suggesting right atrial pressure of 3 mmHg.   Physical Exam:    VS:  BP 126/76   Pulse 69   Ht '5\' 5"'$  (1.651 m)   Wt 165 lb (74.8 kg)   SpO2 100%   BMI 27.46 kg/m    No data found.  Wt Readings from Last 3 Encounters:  09/10/22 165 lb (74.8 kg)  04/02/22 165 lb (74.8 kg)  04/02/22 166 lb 12.8 oz (75.7 kg)     GEN:  Well nourished, well developed in no acute distress HEENT: Normal NECK: No JVD; No carotid bruits CARDIAC: RRR, no murmurs, rubs, gallops RESPIRATORY:  Clear to auscultation without rales, wheezing or rhonchi  ABDOMEN: Soft, non-tender, non-distended MUSCULOSKELETAL: No edema; No deformity  SKIN: Warm and dry NEUROLOGIC:  Alert and  oriented PSYCHIATRIC:  Normal affect     ASSESSMENT AND PLAN    HTN resistant. Essential.  -will continue losartan 100 mg daily,metoprolol  25 mg bid. HCTZ 25 mg daily - Dash diet.  2. Paroxysmal atrial fibrillation -Status post DCCV in November 2019 -Echo showed mild LV dysfunction with dyssynergy due to LBBB. Moderate LAE.   - more recent recurrence in Feb s/p successful DCCV.  - in Apaced rhthym now.  -Continue Eliquis for stroke prevention and metoprolol for rate control.   - last Echo showed normal LV function  3. LBBB chronic.   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   5. HLD. Prior  N/V resolved with stopping simvastatin. No known history of vascular disease. Will focus on lifestyle modification for now.     Disposition - Follow-up in 6 months         Medication Adjustments/Labs and Tests Ordered: Current medicines are reviewed at length with the patient today.  Concerns regarding medicines are outlined above.  No orders of the defined types were placed in this encounter.  No orders of the defined types were placed in this encounter.   There are no Patient Instructions on file for this visit.   Signed, Jenna Ardoin [name], MD  09/10/2022 10:37 AM    [facility]

## 2022-09-10 ENCOUNTER — Encounter: Payer: Self-pay | Admitting: Cardiology

## 2022-09-10 ENCOUNTER — Ambulatory Visit: Payer: Medicare Other | Attending: Cardiology | Admitting: Cardiology

## 2022-09-10 VITALS — BP 126/76 | HR 69 | Ht 65.0 in | Wt 165.0 lb

## 2022-09-10 DIAGNOSIS — I442 Atrioventricular block, complete: Secondary | ICD-10-CM | POA: Diagnosis not present

## 2022-09-10 DIAGNOSIS — E7800 Pure hypercholesterolemia, unspecified: Secondary | ICD-10-CM | POA: Diagnosis not present

## 2022-09-10 DIAGNOSIS — I480 Paroxysmal atrial fibrillation: Secondary | ICD-10-CM

## 2022-09-10 DIAGNOSIS — I10 Essential (primary) hypertension: Secondary | ICD-10-CM

## 2022-09-13 ENCOUNTER — Encounter: Payer: Medicare Other | Admitting: Emergency Medicine

## 2022-09-13 ENCOUNTER — Ambulatory Visit: Payer: Medicare Other | Admitting: Emergency Medicine

## 2022-09-25 ENCOUNTER — Ambulatory Visit (INDEPENDENT_AMBULATORY_CARE_PROVIDER_SITE_OTHER): Payer: Medicare Other | Admitting: Emergency Medicine

## 2022-09-25 ENCOUNTER — Encounter: Payer: Self-pay | Admitting: Emergency Medicine

## 2022-09-25 VITALS — BP 130/70 | HR 62 | Temp 97.9°F | Ht 65.0 in | Wt 164.1 lb

## 2022-09-25 DIAGNOSIS — E785 Hyperlipidemia, unspecified: Secondary | ICD-10-CM | POA: Diagnosis not present

## 2022-09-25 DIAGNOSIS — N1831 Chronic kidney disease, stage 3a: Secondary | ICD-10-CM | POA: Diagnosis not present

## 2022-09-25 DIAGNOSIS — I10 Essential (primary) hypertension: Secondary | ICD-10-CM | POA: Diagnosis not present

## 2022-09-25 DIAGNOSIS — D6859 Other primary thrombophilia: Secondary | ICD-10-CM

## 2022-09-25 DIAGNOSIS — I4891 Unspecified atrial fibrillation: Secondary | ICD-10-CM

## 2022-09-25 DIAGNOSIS — K219 Gastro-esophageal reflux disease without esophagitis: Secondary | ICD-10-CM

## 2022-09-25 DIAGNOSIS — I442 Atrioventricular block, complete: Secondary | ICD-10-CM | POA: Diagnosis not present

## 2022-09-25 DIAGNOSIS — Z7901 Long term (current) use of anticoagulants: Secondary | ICD-10-CM | POA: Diagnosis not present

## 2022-09-25 LAB — COMPREHENSIVE METABOLIC PANEL
ALT: 12 U/L (ref 0–35)
AST: 19 U/L (ref 0–37)
Albumin: 4.2 g/dL (ref 3.5–5.2)
Alkaline Phosphatase: 62 U/L (ref 39–117)
BUN: 22 mg/dL (ref 6–23)
CO2: 28 mEq/L (ref 19–32)
Calcium: 10.4 mg/dL (ref 8.4–10.5)
Chloride: 103 mEq/L (ref 96–112)
Creatinine, Ser: 1.11 mg/dL (ref 0.40–1.20)
GFR: 47 mL/min — ABNORMAL LOW (ref >60.00)
Glucose, Bld: 106 mg/dL — ABNORMAL HIGH (ref 70–99)
Potassium: 4.1 mEq/L (ref 3.5–5.1)
Sodium: 138 mEq/L (ref 135–145)
Total Bilirubin: 0.5 mg/dL (ref 0.2–1.2)
Total Protein: 7.4 g/dL (ref 6.0–8.3)

## 2022-09-25 LAB — CBC WITH DIFFERENTIAL/PLATELET
Basophils Absolute: 0.1 10*3/uL (ref 0.0–0.1)
Basophils Relative: 0.7 % (ref 0.0–3.0)
Eosinophils Absolute: 0.4 10*3/uL (ref 0.0–0.7)
Eosinophils Relative: 3.4 % (ref 0.0–5.0)
HCT: 38 % (ref 36.0–46.0)
Hemoglobin: 12.5 g/dL (ref 12.0–15.0)
Lymphocytes Relative: 25.1 % (ref 12.0–46.0)
Lymphs Abs: 2.8 10*3/uL (ref 0.7–4.0)
MCHC: 33 g/dL (ref 30.0–36.0)
MCV: 80.8 fl (ref 78.0–100.0)
Monocytes Absolute: 0.6 10*3/uL (ref 0.1–1.0)
Monocytes Relative: 5.4 % (ref 3.0–12.0)
Neutro Abs: 7.3 10*3/uL (ref 1.4–7.7)
Neutrophils Relative %: 65.4 % (ref 43.0–77.0)
Platelets: 310 10*3/uL (ref 150.0–400.0)
RBC: 4.7 Mil/uL (ref 3.87–5.11)
RDW: 13.8 % (ref 11.5–15.5)
WBC: 11.1 10*3/uL — ABNORMAL HIGH (ref 4.0–10.5)

## 2022-09-25 LAB — LIPID PANEL
Cholesterol: 214 mg/dL — ABNORMAL HIGH (ref 0–200)
HDL: 50.3 mg/dL (ref >39.00)
LDL Cholesterol: 132 mg/dL — ABNORMAL HIGH (ref 0–99)
NonHDL: 164.09
Total CHOL/HDL Ratio: 4
Triglycerides: 158 mg/dL — ABNORMAL HIGH (ref 0.0–149.0)
VLDL: 31.6 mg/dL (ref 0.0–40.0)

## 2022-09-25 LAB — HEMOGLOBIN A1C: Hgb A1c MFr Bld: 6.5 % (ref 4.6–6.5)

## 2022-09-25 NOTE — Assessment & Plan Note (Signed)
Recent fall last month.  Accidental. Facial injuries.  Recovered well without complications.

## 2022-09-25 NOTE — Assessment & Plan Note (Signed)
Stable.  Diet and nutrition discussed.  No concerns.

## 2022-09-25 NOTE — Assessment & Plan Note (Addendum)
Chronic stable atrial fibrillation. Controlled rate.  Continue metoprolol tartrate 25 mg twice a day On Eliquis 5 mg twice a day

## 2022-09-25 NOTE — Assessment & Plan Note (Signed)
Pacemaker working well.  No concerns.

## 2022-09-25 NOTE — Patient Instructions (Signed)
Health Maintenance After Age 81 After age 81, you are at a higher risk for certain long-term diseases and infections as well as injuries from falls. Falls are a major cause of broken bones and head injuries in people who are older than age 81. Getting regular preventive care can help to keep you healthy and well. Preventive care includes getting regular testing and making lifestyle changes as recommended by your health care provider. Talk with your health care provider about: Which screenings and tests you should have. A screening is a test that checks for a disease when you have no symptoms. A diet and exercise plan that is right for you. What should I know about screenings and tests to prevent falls? Screening and testing are the best ways to find a health problem early. Early diagnosis and treatment give you the best chance of managing medical conditions that are common after age 81. Certain conditions and lifestyle choices may make you more likely to have a fall. Your health care provider may recommend: Regular vision checks. Poor vision and conditions such as cataracts can make you more likely to have a fall. If you wear glasses, make sure to get your prescription updated if your vision changes. Medicine review. Work with your health care provider to regularly review all of the medicines you are taking, including over-the-counter medicines. Ask your health care provider about any side effects that may make you more likely to have a fall. Tell your health care provider if any medicines that you take make you feel dizzy or sleepy. Strength and balance checks. Your health care provider may recommend certain tests to check your strength and balance while standing, walking, or changing positions. Foot health exam. Foot pain and numbness, as well as not wearing proper footwear, can make you more likely to have a fall. Screenings, including: Osteoporosis screening. Osteoporosis is a condition that causes  the bones to get weaker and break more easily. Blood pressure screening. Blood pressure changes and medicines to control blood pressure can make you feel dizzy. Depression screening. You may be more likely to have a fall if you have a fear of falling, feel depressed, or feel unable to do activities that you used to do. Alcohol use screening. Using too much alcohol can affect your balance and may make you more likely to have a fall. Follow these instructions at home: Lifestyle Do not drink alcohol if: Your health care provider tells you not to drink. If you drink alcohol: Limit how much you have to: 0-1 drink a day for women. 0-2 drinks a day for men. Know how much alcohol is in your drink. In the [place] one drink equals one 12 oz bottle of beer (355 mL), one 5 oz glass of wine (148 mL), or one 1 oz glass of hard liquor (44 mL). Do not use any products that contain nicotine or tobacco. These products include cigarettes, chewing tobacco, and vaping devices, such as e-cigarettes. If you need help quitting, ask your health care provider. Activity  Follow a regular exercise program to stay fit. This will help you maintain your balance. Ask your health care provider what types of exercise are appropriate for you. If you need a cane or walker, use it as recommended by your health care provider. Wear supportive shoes that have nonskid soles. Safety  Remove any tripping hazards, such as rugs, cords, and clutter. Install safety equipment such as grab bars in bathrooms and safety rails on stairs. Keep rooms and walkways   well-lit. General instructions Talk with your health care provider about your risks for falling. Tell your health care provider if: You fall. Be sure to tell your health care provider about all falls, even ones that seem minor. You feel dizzy, tiredness (fatigue), or off-balance. Take over-the-counter and prescription medicines only as told by your health care provider. These include  supplements. Eat a healthy diet and maintain a healthy weight. A healthy diet includes low-fat dairy products, low-fat (lean) meats, and fiber from whole grains, beans, and lots of fruits and vegetables. Stay current with your vaccines. Schedule regular health, dental, and eye exams. Summary Having a healthy lifestyle and getting preventive care can help to protect your health and wellness after age 81. Screening and testing are the best way to find a health problem early and help you avoid having a fall. Early diagnosis and treatment give you the best chance for managing medical conditions that are more common for people who are older than age 81. Falls are a major cause of broken bones and head injuries in people who are older than age 81. Take precautions to prevent a fall at home. Work with your health care provider to learn what changes you can make to improve your health and wellness and to prevent falls. This information is not intended to replace advice given to you by your health care provider. Make sure you discuss any questions you have with your health care provider. Document Revised: 01/30/2021 Document Reviewed: 01/30/2021 Elsevier Patient Education  2023 [employer]vier Inc.  

## 2022-09-25 NOTE — Assessment & Plan Note (Signed)
Advised to stay well-hydrated and avoid NSAIDs. ?

## 2022-09-25 NOTE — Assessment & Plan Note (Signed)
Well-controlled hypertension with normal blood pressure readings at home. Continue losartan 100 mg daily and hydrochlorothiazide 25 mg daily Also taking beta-blocker

## 2022-09-25 NOTE — Assessment & Plan Note (Signed)
Stable and asymptomatic. Continue pantoprazole 20 mg as needed

## 2022-09-25 NOTE — Progress Notes (Signed)
Lisa [name] 82 y.o.   Chief Complaint  Patient presents with   Follow-up    51mth f/u appt,  patient requesting physical. patient has had a cold for about 3 weeks and a cough that is not going away. Want something for her cough     HISTORY OF PRESENT ILLNESS: This is a 82y.o. female here for 653-monthollow-up of hypertension and chronic medical conditions. Recently had a cold.  Much improved. No other complaints or medical concerns today Saw cardiologist recently.  No concerns.  HPI   Prior to Admission medications   Medication Sig Start Date End Date Taking? Authorizing Provider  Cholecalciferol (VITAMIN D3) 50 MCG (2000 UT) TABS Take 2,000 Units by mouth daily.   Yes [provider]  ELIQUIS 5 MG TABS tablet TAKE 1 TABLET BY MOUTH TWICE DAILY 02/26/22  Yes JoMartiniquePeter M, MD  fluorouracil (EFUDEX) 5 % cream 1 application Externally once a night for 14 days 06/18/22  Yes [provider]  hydrochlorothiazide (HYDRODIURIL) 25 MG tablet Take 1 tablet (25 mg total) by mouth daily. 03/01/22  Yes JoMartiniquePeter M, MD  losartan (COZAAR) 100 MG tablet TAKE 1 TABLET BY MOUTH EVERY DAY Patient taking differently: Take 100 mg by mouth daily. 08/23/21  Yes JoMartiniquePeter M, MD  metoprolol tartrate (LOPRESSOR) 25 MG tablet Take 1 tablet (25 mg total) by mouth 2 (two) times daily. 12/28/21  Yes JoMartiniquePeter M, MD  pantoprazole (PROTONIX) 20 MG tablet Take 20 mg by mouth daily as needed (gerd/heartburn). 07/04/18  Yes [provider]  Polyethyl Glycol-Propyl Glycol (SYSTANE) 0.4-0.3 % SOLN Place 1-2 drops into both eyes 3 (three) times daily as needed (dry/irritated eyes.).   Yes [provider]  PREVIDENT 5000 BOOSTER PLUS 1.1 % PSTE Place onto teeth daily. 06/12/22  Yes [provider]    Allergies  Allergen Reactions   Codeine Other (See Comments) and Nausea And Vomiting    GI upset Other reaction(s): stomach upset   Amlodipine Swelling     edema    Fosamax [Alendronate Sodium] Other (See Comments)    Tooth problems   Lisinopril Cough   Olmesartan Other (See Comments)    Possible photodermatitis     Patient Active Problem List   Diagnosis Date Noted   Dyslipidemia 03/14/2022   Bartholin cyst 02/02/2022   Complete heart block ([facility]03/07/2022   Age-related osteoporosis without current pathological fracture 11/27/2021   Chronic kidney disease, stage 3a ([facility]03/02/2022   Essential hypertension 11/27/2021   Impaired fasting glucose 11/27/2021   Mixed hyperlipidemia 11/27/2021   Personal history of malignant neoplasm of breast 11/27/2021   Personal history of urinary calculi 11/27/2021   Vitamin D deficiency 11/27/2021   Changing skin lesion 05/12/2021   Encounter for counseling 07/10/2019   Actinic keratosis 02/24/2019   Atrial fibrillation (HCC)    Gastro-esophageal reflux disease without esophagitis    Left bundle branch block    Pilar cyst 04/17/2016    Past Medical History:  Diagnosis Date   Bundle branch block, left    Dyslipidemia    GERD (gastroesophageal reflux disease)    Hypercholesteremia    Hypertension    LBBB (left bundle branch block)    UTI (urinary tract infection)     Past Surgical History:  Procedure Laterality Date   bladder polyp removal     BREAST EXCISIONAL BIOPSY Right    60 yrs ago- Benign   BREAST LUMPECTOMY Left    No  Visible Scar, said 30 + years ago in situ, no chemo, no radiation, or hormone replacement     CARDIOVERSION N/A 07/30/2018   Procedure: CARDIOVERSION;  Surgeon: Jerline Pain, MD;  Location: [facility] ENDOSCOPY;  Service: Cardiovascular;  Laterality: N/A;   CARDIOVERSION N/A 11/28/2021   Procedure: CARDIOVERSION;  Surgeon: Evans Lance, MD;  Location: [place] [facility];  Service: Cardiovascular;  Laterality: N/A;   CESAREAN SECTION     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   Procedure: PACEMAKER IMPLANT;  Surgeon: Evans Lance, MD;  Location: [place] [facility];  Service:  Cardiovascular;  Laterality: N/A;    Social History   Socioeconomic History   Marital status: Married    Spouse name: Not on file   Number of children: 2   Years of education: Not on file   Highest education level: Not on file  Occupational History   Not on file  Tobacco Use   Smoking status: Former    Years: 20.00    Types: Cigarettes    Quit date: 34    Years since quitting: 31.0   Smokeless tobacco: Never  Vaping Use   Vaping Use: Never used  Substance and Sexual Activity   Alcohol use: Yes   Drug use: No   Sexual activity: Not on file  Other Topics Concern   Not on file  Social History Narrative   Not on file   Social Determinants of Health   Financial Resource Strain: Not on file  Food Insecurity: Not on file  Transportation Needs: Not on file  Physical Activity: Not on file  Stress: Not on file  Social Connections: Not on file  Intimate Partner Violence: Not on file    Family History  Problem Relation Age of Onset   Breast cancer Mother    Hip fracture Mother    Ulcers Father    CVA Brother    Renal cancer Brother    Other Brother        hip replacement   CVA Brother        Had P[employer]aker   Crohn's disease Brother    Diabetes Brother      Review of Systems  Constitutional: Negative.  Negative for chills and fever.  HENT: Negative.  Negative for congestion and sore throat.   Respiratory: Negative.  Negative for cough and shortness of breath.   Cardiovascular: Negative.  Negative for chest pain and palpitations.  Gastrointestinal:  Negative for abdominal pain, diarrhea, nausea and vomiting.  Genitourinary: Negative.  Negative for dysuria and hematuria.  Musculoskeletal: Negative.   Skin: Negative.  Negative for rash.  Neurological: Negative.  Negative for dizziness and headaches.  All other systems reviewed and are negative.   Today's Vitals   09/25/22 0810  BP: (!) 142/80  Pulse: 62  Temp: 97.9 F (36.6 C)  TempSrc: Oral  SpO2: 91%   Weight: 164 lb 2 oz (74.4 kg)  Height: '5\' 5"'$  (1.651 m)   Body mass index is 27.31 kg/m. Wt Readings from Last 3 Encounters:  09/25/22 164 lb 2 oz (74.4 kg)  09/10/22 165 lb (74.8 kg)  04/02/22 165 lb (74.8 kg)    Physical Exam Vitals reviewed.  Constitutional:      Appearance: Normal appearance.  HENT:     Head: Normocephalic.     Right Ear: Tympanic membrane, ear canal and external ear normal.     Left Ear: Tympanic membrane, ear canal and external ear normal.     Mouth/Throat:  Mouth: Mucous membranes are moist.     Pharynx: Oropharynx is clear.  Eyes:     Extraocular Movements: Extraocular movements intact.     Conjunctiva/sclera: Conjunctivae normal.     Pupils: Pupils are equal, round, and reactive to light.  Cardiovascular:     Rate and Rhythm: Normal rate. Rhythm irregular.     Pulses: Normal pulses.     Heart sounds: Normal heart sounds.     Comments: Pacemaker in left upper chest Pulmonary:     Effort: Pulmonary effort is normal.     Breath sounds: Normal breath sounds.  Abdominal:     Palpations: Abdomen is soft.     Tenderness: There is no abdominal tenderness.  Musculoskeletal:     Cervical back: No tenderness.     Right lower leg: No edema.     Left lower leg: No edema.  Lymphadenopathy:     Cervical: No cervical adenopathy.  Skin:    General: Skin is warm and dry.  Neurological:     General: No focal deficit present.     Mental Status: She is alert and oriented to person, place, and time.  Psychiatric:        Mood and Affect: Mood normal.        Behavior: Behavior normal.      ASSESSMENT & PLAN: A total of 47 minutes was spent with the patient and counseling/coordination of care regarding preparing for this visit, review of most recent office visit notes, review of multiple chronic medical conditions and their management, review of all medications, cardiovascular risks associated with hypertension, review of most recent blood work results,  education on nutrition, prognosis, documentation, and need for follow-up.  Problem List Items Addressed This Visit       Cardiovascular and Mediastinum   Atrial fibrillation (HCC)    Chronic stable atrial fibrillation. Controlled rate.  Continue metoprolol tartrate 25 mg twice a day On Eliquis 5 mg twice a day      Essential hypertension - Primary    Well-controlled hypertension with normal blood pressure readings at home. Continue losartan 100 mg daily and hydrochlorothiazide 25 mg daily Also taking beta-blocker      Relevant Orders   CBC with Differential/Platelet   Comprehensive metabolic panel   Hemoglobin A1c   Complete heart block ([place])    Pacemaker working well.  No concerns.        Digestive   Gastro-esophageal reflux disease without esophagitis    Stable and asymptomatic. Continue pantoprazole 20 mg as needed        Genitourinary   Chronic kidney disease, stage 3a ([place])    Advised to stay well-hydrated and avoid NSAIDs      Relevant Orders   Comprehensive metabolic panel     Hematopoietic and Hemostatic   Thrombophilia ([place])     Other   Dyslipidemia    Stable.  Diet and nutrition discussed.  No concerns.       Relevant Orders   Hemoglobin A1c   Lipid panel   Current use of long term anticoagulation    Recent fall last month.  Accidental. Facial injuries.  Recovered well without complications.      Relevant Orders   CBC with Differential/Platelet   Patient Instructions  Health Maintenance After Age 25 After age 7, you are at a higher risk for certain long-term diseases and infections as well as injuries from falls. Falls are a major cause of broken bones and head injuries in people who  are older than age 8. Getting regular preventive care can help to keep you healthy and well. Preventive care includes getting regular testing and making lifestyle changes as recommended by your health care provider. Talk with your health care provider  about: Which screenings and tests you should have. A screening is a test that checks for a disease when you have no symptoms. A diet and exercise plan that is right for you. What should I know about screenings and tests to prevent falls? Screening and testing are the best ways to find a health problem early. Early diagnosis and treatment give you the best chance of managing medical conditions that are common after age 72. Certain conditions and lifestyle choices may make you more likely to have a fall. Your health care provider may recommend: Regular vision checks. Poor vision and conditions such as cataracts can make you more likely to have a fall. If you wear glasses, make sure to get your prescription updated if your vision changes. Medicine review. Work with your health care provider to regularly review all of the medicines you are taking, including over-the-counter medicines. Ask your health care provider about any side effects that may make you more likely to have a fall. Tell your health care provider if any medicines that you take make you feel dizzy or sleepy. Strength and balance checks. Your health care provider may recommend certain tests to check your strength and balance while standing, walking, or changing positions. Foot health exam. Foot pain and numbness, as well as not wearing proper footwear, can make you more likely to have a fall. Screenings, including: Osteoporosis screening. Osteoporosis is a condition that causes the bones to get weaker and break more easily. Blood pressure screening. Blood pressure changes and medicines to control blood pressure can make you feel dizzy. Depression screening. You may be more likely to have a fall if you have a fear of falling, feel depressed, or feel unable to do activities that you used to do. Alcohol use screening. Using too much alcohol can affect your balance and may make you more likely to have a fall. Follow these instructions at  home: Lifestyle Do not drink alcohol if: Your health care provider tells you not to drink. If you drink alcohol: Limit how much you have to: 0-1 drink a day for women. 0-2 drinks a day for men. Know how much alcohol is in your drink. In the [place] one drink equals one 12 oz bottle of beer (355 mL), one 5 oz glass of wine (148 mL), or one 1 oz glass of hard liquor (44 mL). Do not use any products that contain nicotine or tobacco. These products include cigarettes, chewing tobacco, and vaping devices, such as e-cigarettes. If you need help quitting, ask your health care provider. Activity  Follow a regular exercise program to stay fit. This will help you maintain your balance. Ask your health care provider what types of exercise are appropriate for you. If you need a cane or walker, use it as recommended by your health care provider. Wear supportive shoes that have nonskid soles. Safety  Remove any tripping hazards, such as rugs, cords, and clutter. Install safety equipment such as grab bars in bathrooms and safety rails on stairs. Keep rooms and walkways well-lit. General instructions Talk with your health care provider about your risks for falling. Tell your health care provider if: You fall. Be sure to tell your health care provider about all falls, even ones that seem minor. You  feel dizzy, tiredness (fatigue), or off-balance. Take over-the-counter and prescription medicines only as told by your health care provider. These include supplements. Eat a healthy diet and maintain a healthy weight. A healthy diet includes low-fat dairy products, low-fat (lean) meats, and fiber from whole grains, beans, and lots of fruits and vegetables. Stay current with your vaccines. Schedule regular health, dental, and eye exams. Summary Having a healthy lifestyle and getting preventive care can help to protect your health and wellness after age 96. Screening and testing are the best way to find a health  problem early and help you avoid having a fall. Early diagnosis and treatment give you the best chance for managing medical conditions that are more common for people who are older than age 64. Falls are a major cause of broken bones and head injuries in people who are older than age 10. Take precautions to prevent a fall at home. Work with your health care provider to learn what changes you can make to improve your health and wellness and to prevent falls. This information is not intended to replace advice given to you by your health care provider. Make sure you discuss any questions you have with your health care provider. Document Revised: 01/30/2021 Document Reviewed: 01/30/2021 Elsevier Patient Education  [place], MD [facility]

## 2022-10-03 NOTE — Progress Notes (Signed)
Remote pacemaker transmission.   

## 2022-10-16 DIAGNOSIS — K080 Exfoliation of teeth due to systemic causes: Secondary | ICD-10-CM | POA: Diagnosis not present

## 2022-10-23 DIAGNOSIS — K080 Exfoliation of teeth due to systemic causes: Secondary | ICD-10-CM | POA: Diagnosis not present

## 2022-10-31 ENCOUNTER — Other Ambulatory Visit: Payer: Self-pay

## 2022-10-31 MED ORDER — LOSARTAN POTASSIUM 100 MG PO TABS: 100.0000 mg | ORAL_TABLET | Freq: Every day | ORAL | 3 refills | Status: DC

## 2022-11-12 DIAGNOSIS — K080 Exfoliation of teeth due to systemic causes: Secondary | ICD-10-CM | POA: Diagnosis not present

## 2022-11-14 ENCOUNTER — Telehealth: Payer: Self-pay | Admitting: Cardiology

## 2022-11-14 NOTE — Telephone Encounter (Signed)
Patient would like to know how she can get her appointment moved up to June. She states she was previously advised to contact the office around this time frame for a 6 month follow up appointment in June, but we were unable to get her in until September. Patient states that Rudd, LPN, advised her to contact her directly if she has any issues.

## 2022-11-14 NOTE — Telephone Encounter (Signed)
Returned call to patient and moved appointment up June 3rd with Dr. [name]. Patient aware of appointment time and date and verbalized understanding.

## 2022-12-04 ENCOUNTER — Ambulatory Visit (INDEPENDENT_AMBULATORY_CARE_PROVIDER_SITE_OTHER): Payer: Medicare Other

## 2022-12-04 DIAGNOSIS — I442 Atrioventricular block, complete: Secondary | ICD-10-CM | POA: Diagnosis not present

## 2022-12-05 DIAGNOSIS — H18513 Endothelial corneal dystrophy, bilateral: Secondary | ICD-10-CM | POA: Diagnosis not present

## 2022-12-05 DIAGNOSIS — H11441 Conjunctival cysts, right eye: Secondary | ICD-10-CM | POA: Diagnosis not present

## 2022-12-05 DIAGNOSIS — H1131 Conjunctival hemorrhage, right eye: Secondary | ICD-10-CM | POA: Diagnosis not present

## 2022-12-06 LAB — CUP PACEART REMOTE DEVICE CHECK
Battery Remaining Percentage: 95 %
Brady Statistic RA Percent Paced: 68 %
Brady Statistic RV Percent Paced: 2 %
Date Time Interrogation Session: 20240311100432
Implantable Lead Connection Status: 753985
Implantable Lead Connection Status: 753985
Implantable Lead Implant Date: 20230313
Implantable Lead Implant Date: 20230313
Implantable Lead Location: 753859
Implantable Lead Location: 753860
Implantable Lead Model: 377171
Implantable Lead Model: 377171
Implantable Lead Serial Number: 7000407809
Implantable Lead Serial Number: 8000780983
Implantable Pulse Generator Implant Date: 20230313
Lead Channel Impedance Value: 546 Ohm
Lead Channel Impedance Value: 566 Ohm
Lead Channel Pacing Threshold Amplitude: 0.6 V
Lead Channel Pacing Threshold Amplitude: 1 V
Lead Channel Pacing Threshold Pulse Width: 0.4 ms
Lead Channel Pacing Threshold Pulse Width: 0.4 ms
Lead Channel Sensing Intrinsic Amplitude: 1 mV
Lead Channel Sensing Intrinsic Amplitude: 13 mV
Lead Channel Setting Pacing Amplitude: 2 V
Lead Channel Setting Pacing Amplitude: 2 V
Lead Channel Setting Pacing Pulse Width: 0.4 ms
Pulse Gen Model: 407145
Pulse Gen Serial Number: 70364045

## 2023-01-01 ENCOUNTER — Other Ambulatory Visit: Payer: Self-pay | Admitting: Cardiology

## 2023-01-11 NOTE — Progress Notes (Signed)
Remote pacemaker transmission.   

## 2023-02-22 NOTE — Progress Notes (Unsigned)
Cardiology Office Note:    Date:  09/10/2022   ID:  Kamira G Fee, DOB 09/27/1940, MRN [id number]  PCP:  Sagardia, Miguel Jose, MD [facility] Cardiologist: Cathalina Barcia, MD   Reason for visit: Follow-up ED visit for HTN  History of Present Illness:    Lisa [name] is a 82 y.o. female with a hx of atrial fibrillation s/p DCCV in 07/2018 (sx SOB, swelling, indigestion), hypertension, hyperlipidemia, left bundle branch block.   Myoview in January 2019 was normal.  She last saw Dr. Foye Damron on June 19, 2021 and was doing well.    She went to [facility] and complained of sudden right shoulder pain along with nausea and one episode of vomiting.  She took aspirin and her symptoms improved.  Troponin unremarkable.  Chest x-ray with mild enlargement of the cardiomediastinal silhouette and borderline pulmonary venous congestion. Pain felt to be musculoskeletal related to fall.   She was seen in ED at [facility] in Jan 2023. She had an episode of syncope at home. Following this she had acute N/V and diarrhea. BP was quite high. Ecg showed NSR with chronic LBBB. BP was quite high to 202/110. Labs including troponins were negative. She hasn't had any further dizziness or syncope but has persistent elevated BP.  She was on metoprolol, losartan and HCTZ.  When seen in Feb she was in NSR. Later presented in March with recurrent Afib. She underwent successful DCCV. 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 She was admitted and underwent placement of dual chamber pacemaker by Dr Taylor. Echo was unremarkable. She was DC on metoprolol and Eliquis. Prior renal artery duplex was negative for RAS.   Pacemaker follow up in June was normal.   On follow up today she is doing very well. No palpitations, dizziness, belching or SOB. Tolerating medication well. BP has been under good control. She feels well.  On pacer check no Afib detected.      Past Medical History:  Diagnosis Date   Bundle branch block, left    Dyslipidemia    GERD (gastroesophageal reflux disease)    Hypercholesteremia    Hypertension    LBBB (left bundle branch block)    UTI (urinary tract infection)     Past Surgical History:  Procedure Laterality Date   bladder polyp removal     BREAST EXCISIONAL BIOPSY Right    60 yrs ago- Benign   BREAST LUMPECTOMY Left    No Visible Scar, said 30 + years ago in situ, no chemo, no radiation, or hormone replacement     CARDIOVERSION N/A 07/30/2018   Procedure: CARDIOVERSION;  Surgeon: Skains, Mark C, MD;  Location: [facility] ENDOSCOPY;  Service: Cardiovascular;  Laterality: N/A;   CARDIOVERSION N/A 11/28/2021   Procedure: CARDIOVERSION;  Surgeon: Taylor, Gregg W, MD;  Location: [facility] INVASIVE CV LAB;  Service: Cardiovascular;  Laterality: N/A;   CESAREAN SECTION     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   Procedure: PACEMAKER IMPLANT;  Surgeon: Taylor, Gregg W, MD;  Location: [facility] INVASIVE CV LAB;  Service: Cardiovascular;  Laterality: N/A;    Current Medications: Current Meds  Medication Sig   Cholecalciferol (VITAMIN D3) 50 MCG (2000 UT) TABS Take 2,000 Units by mouth daily.   ELIQUIS 5 MG TABS tablet TAKE 1 TABLET BY MOUTH TWICE DAILY   fluorouracil (EFUDEX) 5 % cream 1 application Externally once a night for 14   days   hydrochlorothiazide (HYDRODIURIL) 25 MG tablet Take 1 tablet (25 mg total) by mouth daily.   losartan (COZAAR) 100 MG tablet TAKE 1 TABLET BY MOUTH EVERY DAY (Patient taking differently: Take 100 mg by mouth daily.)   metoprolol tartrate (LOPRESSOR) 25 MG tablet Take 1 tablet (25 mg total) by mouth 2 (two) times daily.   pantoprazole (PROTONIX) 20 MG tablet Take 20 mg by mouth daily as needed (gerd/heartburn).   Polyethyl Glycol-Propyl Glycol (SYSTANE) 0.4-0.3 % SOLN Place 1-2 drops into both eyes 3 (three) times daily as needed (dry/irritated eyes.).   PREVIDENT 5000  BOOSTER PLUS 1.1 % PSTE Place onto teeth daily.     Allergies:   Codeine, Amlodipine, Fosamax [alendronate sodium], Lisinopril, and Olmesartan   Social History   Socioeconomic History   Marital status: Married    Spouse name: Not on file   Number of children: 2   Years of education: Not on file   Highest education level: Not on file  Occupational History   Not on file  Tobacco Use   Smoking status: Former    Years: 20.00    Types: Cigarettes    Quit date: 1993    Years since quitting: 30.9   Smokeless tobacco: Never  Vaping Use   Vaping Use: Never used  Substance and Sexual Activity   Alcohol use: Yes   Drug use: No   Sexual activity: Not on file  Other Topics Concern   Not on file  Social History Narrative   Not on file   Social Determinants of Health   Financial Resource Strain: Not on file  Food Insecurity: Not on file  Transportation Needs: Not on file  Physical Activity: Not on file  Stress: Not on file  Social Connections: Not on file     Family History: The patient's family history includes Breast cancer in her mother; CVA in her brother and brother; Crohn's disease in her brother; Diabetes in her brother; Hip fracture in her mother; Other in her brother; Renal cancer in her brother; Ulcers in her father.  ROS:   Please see the history of present illness.     EKGs/Labs/Other Studies Reviewed:    EKG:  none today   Recent Labs: 12/02/2021: TSH 2.480 12/03/2021: Magnesium 1.7 03/14/2022: ALT 17; BUN 18; Creatinine, Ser 1.08; Hemoglobin 12.6; Platelets 274.0; Potassium 3.9; Sodium 137   Recent Lipid Panel Lab Results  Component Value Date/Time   CHOL 238 (H) 03/14/2022 09:07 AM   TRIG 150.0 (H) 03/14/2022 09:07 AM   HDL 61.90 03/14/2022 09:07 AM   LDLCALC 146 (H) 03/14/2022 09:07 AM    Dated 06/03/20: cholesterol 159, triglycerides 94, HDL 59, LDL 83. Dated 06/15/21: A1c 6.3, Hgb 12.5. chemistries and TSH normal. Dated 11/15/21:A1c 6.3%   Echo  11/30/21: IMPRESSIONS     1. Left ventricular ejection fraction, by estimation, is 60 to 65%. Left  ventricular ejection fraction by 3D volume is 66 %. The left ventricle has  normal function. The left ventricle has no regional wall motion  abnormalities. Left ventricular diastolic   parameters are consistent with Grade III diastolic dysfunction  (restrictive). Elevated left ventricular end-diastolic pressure.   2. Right ventricular systolic function is normal. The right ventricular  size is mildly enlarged. There is normal pulmonary artery systolic  pressure. The estimated right ventricular systolic pressure is 23.4 mmHg.   3. Left atrial size was moderately dilated.   4. Right atrial size was mildly dilated.   5. The   mitral valve is normal in structure. No evidence of mitral valve  regurgitation. No evidence of mitral stenosis. Severe mitral annular  calcification.   6. The aortic valve is tricuspid. Aortic valve regurgitation is not  visualized. Aortic valve sclerosis/calcification is present, without any  evidence of aortic stenosis.   7. Aortic dilatation noted. There is borderline dilatation of the  ascending aorta, measuring 37 mm.   8. The inferior vena cava is normal in size with greater than 50%  respiratory variability, suggesting right atrial pressure of 3 mmHg.   Physical Exam:    VS:  BP 126/76   Pulse 69   Ht 5' 5" (1.651 m)   Wt 165 lb (74.8 kg)   SpO2 100%   BMI 27.46 kg/m    No data found.  Wt Readings from Last 3 Encounters:  09/10/22 165 lb (74.8 kg)  04/02/22 165 lb (74.8 kg)  04/02/22 166 lb 12.8 oz (75.7 kg)     GEN:  Well nourished, well developed in no acute distress HEENT: Normal NECK: No JVD; No carotid bruits CARDIAC: RRR, no murmurs, rubs, gallops RESPIRATORY:  Clear to auscultation without rales, wheezing or rhonchi  ABDOMEN: Soft, non-tender, non-distended MUSCULOSKELETAL: No edema; No deformity  SKIN: Warm and dry NEUROLOGIC:  Alert and  oriented PSYCHIATRIC:  Normal affect     ASSESSMENT AND PLAN    HTN resistant. Essential.  -will continue losartan 100 mg daily,metoprolol  25 mg bid. HCTZ 25 mg daily - Dash diet.  2. Paroxysmal atrial fibrillation -Status post DCCV in November 2019 -Echo showed mild LV dysfunction with dyssynergy due to LBBB. Moderate LAE.   - more recent recurrence in Feb s/p successful DCCV.  - in Apaced rhthym now.  -Continue Eliquis for stroke prevention and metoprolol for rate control.   - last Echo showed normal LV function  3. LBBB chronic.   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   5. HLD. Prior  N/V resolved with stopping simvastatin. No known history of vascular disease. Will focus on lifestyle modification for now.     Disposition - Follow-up in 6 months         Medication Adjustments/Labs and Tests Ordered: Current medicines are reviewed at length with the patient today.  Concerns regarding medicines are outlined above.  No orders of the defined types were placed in this encounter.  No orders of the defined types were placed in this encounter.   There are no Patient Instructions on file for this visit.   Signed, Halley Shepheard, MD  09/10/2022 10:37 AM    [facility] 

## 2023-02-25 ENCOUNTER — Encounter: Payer: Self-pay | Admitting: Cardiology

## 2023-02-25 ENCOUNTER — Ambulatory Visit: Payer: Medicare Other | Attending: Cardiology | Admitting: Cardiology

## 2023-02-25 VITALS — BP 134/84 | HR 64 | Ht 65.0 in | Wt 164.2 lb

## 2023-02-25 DIAGNOSIS — I442 Atrioventricular block, complete: Secondary | ICD-10-CM

## 2023-02-25 DIAGNOSIS — E7800 Pure hypercholesterolemia, unspecified: Secondary | ICD-10-CM | POA: Diagnosis not present

## 2023-02-25 DIAGNOSIS — I447 Left bundle-branch block, unspecified: Secondary | ICD-10-CM

## 2023-02-25 DIAGNOSIS — I10 Essential (primary) hypertension: Secondary | ICD-10-CM

## 2023-02-25 DIAGNOSIS — I480 Paroxysmal atrial fibrillation: Secondary | ICD-10-CM | POA: Diagnosis not present

## 2023-02-25 MED ORDER — HYDROCHLOROTHIAZIDE 25 MG PO TABS: 12.5000 mg | ORAL_TABLET | Freq: Every day | ORAL | 3 refills | Status: DC

## 2023-02-25 MED ORDER — APIXABAN 5 MG PO TABS: 5.0000 mg | ORAL_TABLET | Freq: Two times a day (BID) | ORAL | 0 refills | Status: DC

## 2023-02-25 NOTE — Addendum Note (Signed)
Addended by: Neoma Laming on: 02/25/2023 09:42 AM   Modules accepted: Orders

## 2023-02-25 NOTE — Patient Instructions (Signed)
Medication Instructions:  Decrease HCTZ to 12.5 mg daily Continue all other medications *If you need a refill on your cardiac medications before your next appointment, please call your pharmacy*   Lab Work: None ordered   Testing/Procedures: None ordered   Follow-Up: At [facility], you and your health needs are our priority.  As part of our continuing mission to provide you with exceptional heart care, we have created designated Provider Care Teams.  These Care Teams include your primary Cardiologist (physician) and Advanced Practice Providers (APPs -  Physician Assistants and Nurse Practitioners) who all work together to provide you with the care you need, when you need it.  We recommend signing up for the patient portal called "MyChart".  Sign up information is provided on this After Visit Summary.  MyChart is used to connect with patients for Virtual Visits (Telemedicine).  Patients are able to view lab/test results, encounter notes, upcoming appointments, etc.  Non-urgent messages can be sent to your provider as well.   To learn more about what you can do with MyChart, go to [email or web address].    Your next appointment:  6 months    Provider:  Dr.Jordan

## 2023-02-26 ENCOUNTER — Telehealth: Payer: Self-pay | Admitting: Cardiology

## 2023-02-26 MED ORDER — HYDROCHLOROTHIAZIDE 12.5 MG PO TABS: 12.5000 mg | ORAL_TABLET | Freq: Every day | ORAL | 3 refills | Status: DC

## 2023-02-26 NOTE — Telephone Encounter (Signed)
Pt c/o medication issue:  1. Name of Medication:   hydrochlorothiazide (HYDRODIURIL) 25 MG tablet   2. How are you currently taking this medication (dosage and times per day)?   As prescribed  3. Are you having a reaction (difficulty breathing--STAT)?   No  4. What is your medication issue?   Patient stated current prescription is written for 25 mg and patient states the pharmacy has this medication in 12.5 mg dosage.  Patient wants prescription written for the 12.5 mg dose.

## 2023-02-26 NOTE — Telephone Encounter (Signed)
Patient ask for new RX to be the 12.5mg  dose.  States she has plenty of the 25mg  if her dosage were to be increased, but hard to cut the pill in half as it does not always split well.  New RX sent for the 12.5 mg tablet and educated she will need to take the whole pill of this dose.  She states understanding.

## 2023-03-05 ENCOUNTER — Ambulatory Visit (INDEPENDENT_AMBULATORY_CARE_PROVIDER_SITE_OTHER): Payer: Medicare Other

## 2023-03-05 DIAGNOSIS — I442 Atrioventricular block, complete: Secondary | ICD-10-CM | POA: Diagnosis not present

## 2023-03-05 LAB — CUP PACEART REMOTE DEVICE CHECK
Battery Remaining Percentage: 90 %
Brady Statistic RA Percent Paced: 69 %
Brady Statistic RV Percent Paced: 2 %
Date Time Interrogation Session: 20240611091052
Implantable Lead Connection Status: 753985
Implantable Lead Connection Status: 753985
Implantable Lead Implant Date: 20230313
Implantable Lead Implant Date: 20230313
Implantable Lead Location: 753859
Implantable Lead Location: 753860
Implantable Lead Model: 377171
Implantable Lead Model: 377171
Implantable Lead Serial Number: 7000407809
Implantable Lead Serial Number: 8000780983
Implantable Pulse Generator Implant Date: 20230313
Lead Channel Impedance Value: 566 Ohm
Lead Channel Impedance Value: 566 Ohm
Lead Channel Pacing Threshold Amplitude: 0.6 V
Lead Channel Pacing Threshold Amplitude: 0.9 V
Lead Channel Pacing Threshold Pulse Width: 0.4 ms
Lead Channel Pacing Threshold Pulse Width: 0.4 ms
Lead Channel Sensing Intrinsic Amplitude: 0.3 mV
Lead Channel Sensing Intrinsic Amplitude: 13.5 mV
Lead Channel Setting Pacing Amplitude: 2 V
Lead Channel Setting Pacing Amplitude: 2 V
Lead Channel Setting Pacing Pulse Width: 0.4 ms
Pulse Gen Model: 407145
Pulse Gen Serial Number: 70364045

## 2023-03-21 ENCOUNTER — Ambulatory Visit: Payer: Medicare Other | Attending: Student | Admitting: Pulmonary Disease

## 2023-03-21 ENCOUNTER — Encounter: Payer: Self-pay | Admitting: Pulmonary Disease

## 2023-03-21 VITALS — BP 142/74 | HR 60 | Ht 65.0 in | Wt 163.6 lb

## 2023-03-21 DIAGNOSIS — D6869 Other thrombophilia: Secondary | ICD-10-CM

## 2023-03-21 DIAGNOSIS — I442 Atrioventricular block, complete: Secondary | ICD-10-CM

## 2023-03-21 DIAGNOSIS — I480 Paroxysmal atrial fibrillation: Secondary | ICD-10-CM | POA: Diagnosis not present

## 2023-03-21 DIAGNOSIS — Z950 Presence of cardiac pacemaker: Secondary | ICD-10-CM | POA: Diagnosis not present

## 2023-03-21 LAB — CUP PACEART INCLINIC DEVICE CHECK
Date Time Interrogation Session: 20240627130129
Implantable Lead Connection Status: 753985
Implantable Lead Connection Status: 753985
Implantable Lead Implant Date: 20230313
Implantable Lead Implant Date: 20230313
Implantable Lead Location: 753859
Implantable Lead Location: 753860
Implantable Lead Model: 377171
Implantable Lead Model: 377171
Implantable Lead Serial Number: 7000407809
Implantable Lead Serial Number: 8000780983
Implantable Pulse Generator Implant Date: 20230313
Pulse Gen Model: 407145
Pulse Gen Serial Number: 70364045

## 2023-03-21 NOTE — Progress Notes (Signed)
  Electrophysiology Office Note:   Date:  03/21/2023  ID:  KABREA SEENEY, DOB 05/16/1941, MRN [id number]  Primary Cardiologist: Peter Swaziland, MD Electrophysiologist: Lewayne Bunting, MD      History of Present Illness:   Lisa [name] is a 82 y.o. female with h/o CKD III, HLD, HTN, LBBB, DCCV 07/2018 for AF, CHB s/p PPM,  seen today for routine electrophysiology followup.    Since last being seen in our clinic the patient reports doing very well.  She remains active in her home, frequently climbs stairs and works in her yard. Occasionally has to stop and rest after climbing 3 flights of stairs with groceries. She denies chest pain, palpitations, dyspnea, PND, orthopnea, nausea, vomiting, dizziness, syncope, edema, weight gain, or early satiety.   Review of systems complete and found to be negative unless listed in HPI.    EP Information / Studies Reviewed:    Studies  07/2018 DCCV for AF  EKG is not ordered today. EKG from 02/25/23 reviewed which showed        PPM Interrogation-  reviewed in detail today,  See PACEART report.  Device History: Biotronik Dual Chamber PPM implanted 12/04/2021 Symptomatic Bradycardia due to [employer]	[place] attacks for CHB  Risk Assessment/Calculations:    CHA2DS2-VASc Score = 4   This indicates a 4.8% annual risk of stroke. The patient's score is based upon: CHF History: 0 HTN History: 1 Diabetes History: 0 Stroke History: 0 Vascular Disease History: 0 Age Score: 2 Gender Score: 1        Physical Exam:   VS:  BP (!) 142/74   Pulse 60   Ht 5\' 5"  (1.651 m)   Wt 163 lb 9.6 oz (74.2 kg)   SpO2 98%   BMI 27.22 kg/m    Wt Readings from Last 3 Encounters:  03/21/23 163 lb 9.6 oz (74.2 kg)  02/25/23 164 lb 3.2 oz (74.5 kg)  09/25/22 164 lb 2 oz (74.4 kg)     GEN: Well nourished, well developed in no acute distress NECK: No JVD; No carotid bruits CARDIAC: Regular rate and rhythm, no murmurs, rubs, gallops. Pacemaker site well healed without  edema, erythema or tethering RESPIRATORY:  Clear to auscultation without rales, wheezing or rhonchi  ABDOMEN: Soft, non-tender, non-distended EXTREMITIES:  No edema; No deformity   ASSESSMENT AND PLAN:    Symptomatic bradycardia s/p Biotronik PPM  Normal PPM function See Pace Art report No changes today  Paroxsymal Atrial Fibrillation  DCCV 07/2018  -continue eliquis, dose reviewed & appropriate. Pt has follow up labs with PCP this week.    Secondary Hypercoagulable State -eliquis as above   Disposition:   Follow up with Dr. Ladona Ridgel in 12 months  Signed, Canary Brim, MSN, APRN, NP-C, AGACNP-BC [facility] - Electrophysiology  03/21/2023, 10:46 AM

## 2023-03-21 NOTE — Patient Instructions (Signed)
Medication Instructions:  Your physician recommends that you continue on your current medications as directed. Please refer to the Current Medication list given to you today.  *If you need a refill on your cardiac medications before your next appointment, please call your pharmacy*  Lab Work: None ordered If you have labs (blood work) drawn today and your tests are completely normal, you will receive your results only by: MyChart Message (if you have MyChart) OR A paper copy in the mail If you have any lab test that is abnormal or we need to change your treatment, we will call you to review the results.  Follow-Up: At [facility], you and your health needs are our priority.  As part of our continuing mission to provide you with exceptional heart care, we have created designated Provider Care Teams.  These Care Teams include your primary Cardiologist (physician) and Advanced Practice Providers (APPs -  Physician Assistants and Nurse Practitioners) who all work together to provide you with the care you need, when you need it.  Your next appointment:   1 year(s)  Provider:   Gregg Taylor, MD  

## 2023-03-26 ENCOUNTER — Encounter: Payer: Self-pay | Admitting: Emergency Medicine

## 2023-03-26 ENCOUNTER — Ambulatory Visit (INDEPENDENT_AMBULATORY_CARE_PROVIDER_SITE_OTHER): Payer: Medicare Other | Admitting: Emergency Medicine

## 2023-03-26 VITALS — BP 128/86 | HR 60 | Temp 97.9°F | Ht 65.0 in | Wt 166.0 lb

## 2023-03-26 DIAGNOSIS — Z13228 Encounter for screening for other metabolic disorders: Secondary | ICD-10-CM | POA: Diagnosis not present

## 2023-03-26 DIAGNOSIS — E785 Hyperlipidemia, unspecified: Secondary | ICD-10-CM

## 2023-03-26 DIAGNOSIS — K219 Gastro-esophageal reflux disease without esophagitis: Secondary | ICD-10-CM

## 2023-03-26 DIAGNOSIS — R7303 Prediabetes: Secondary | ICD-10-CM | POA: Insufficient documentation

## 2023-03-26 DIAGNOSIS — Z13220 Encounter for screening for lipoid disorders: Secondary | ICD-10-CM

## 2023-03-26 DIAGNOSIS — Z0000 Encounter for general adult medical examination without abnormal findings: Secondary | ICD-10-CM | POA: Diagnosis not present

## 2023-03-26 DIAGNOSIS — I10 Essential (primary) hypertension: Secondary | ICD-10-CM | POA: Diagnosis not present

## 2023-03-26 DIAGNOSIS — Z130 Encounter for screening for diseases of the blood and blood-forming organs and certain disorders involving the immune mechanism: Secondary | ICD-10-CM | POA: Diagnosis not present

## 2023-03-26 DIAGNOSIS — I4891 Unspecified atrial fibrillation: Secondary | ICD-10-CM

## 2023-03-26 DIAGNOSIS — D6859 Other primary thrombophilia: Secondary | ICD-10-CM

## 2023-03-26 DIAGNOSIS — I442 Atrioventricular block, complete: Secondary | ICD-10-CM

## 2023-03-26 DIAGNOSIS — N1831 Chronic kidney disease, stage 3a: Secondary | ICD-10-CM

## 2023-03-26 DIAGNOSIS — Z7901 Long term (current) use of anticoagulants: Secondary | ICD-10-CM

## 2023-03-26 DIAGNOSIS — Z1329 Encounter for screening for other suspected endocrine disorder: Secondary | ICD-10-CM

## 2023-03-26 DIAGNOSIS — Z789 Other specified health status: Secondary | ICD-10-CM | POA: Insufficient documentation

## 2023-03-26 DIAGNOSIS — Z0001 Encounter for general adult medical examination with abnormal findings: Secondary | ICD-10-CM | POA: Diagnosis not present

## 2023-03-26 LAB — MICROALBUMIN / CREATININE URINE RATIO
Creatinine,U: 79.2 mg/dL
Microalb Creat Ratio: 1.5 mg/g (ref 0.0–30.0)
Microalb, Ur: 1.2 mg/dL (ref 0.0–1.9)

## 2023-03-26 LAB — CBC WITH DIFFERENTIAL/PLATELET
Basophils Absolute: 0.1 10*3/uL (ref 0.0–0.1)
Basophils Relative: 0.7 % (ref 0.0–3.0)
Eosinophils Absolute: 0.2 10*3/uL (ref 0.0–0.7)
Eosinophils Relative: 2.6 % (ref 0.0–5.0)
HCT: 37.6 % (ref 36.0–46.0)
Hemoglobin: 12.2 g/dL (ref 12.0–15.0)
Lymphocytes Relative: 26.7 % (ref 12.0–46.0)
Lymphs Abs: 2.3 10*3/uL (ref 0.7–4.0)
MCHC: 32.5 g/dL (ref 30.0–36.0)
MCV: 80.2 fl (ref 78.0–100.0)
Monocytes Absolute: 0.5 10*3/uL (ref 0.1–1.0)
Monocytes Relative: 6.3 % (ref 3.0–12.0)
Neutro Abs: 5.5 10*3/uL (ref 1.4–7.7)
Neutrophils Relative %: 63.7 % (ref 43.0–77.0)
Platelets: 281 10*3/uL (ref 150.0–400.0)
RBC: 4.69 Mil/uL (ref 3.87–5.11)
RDW: 14.3 % (ref 11.5–15.5)
WBC: 8.6 10*3/uL (ref 4.0–10.5)

## 2023-03-26 LAB — COMPREHENSIVE METABOLIC PANEL
ALT: 13 U/L (ref 0–35)
AST: 18 U/L (ref 0–37)
Albumin: 4.1 g/dL (ref 3.5–5.2)
Alkaline Phosphatase: 60 U/L (ref 39–117)
BUN: 22 mg/dL (ref 6–23)
CO2: 27 mEq/L (ref 19–32)
Calcium: 10.4 mg/dL (ref 8.4–10.5)
Chloride: 103 mEq/L (ref 96–112)
Creatinine, Ser: 1.25 mg/dL — ABNORMAL HIGH (ref 0.40–1.20)
GFR: 40.62 mL/min — ABNORMAL LOW (ref >60.00)
Glucose, Bld: 111 mg/dL — ABNORMAL HIGH (ref 70–99)
Potassium: 4.7 mEq/L (ref 3.5–5.1)
Sodium: 137 mEq/L (ref 135–145)
Total Bilirubin: 0.7 mg/dL (ref 0.2–1.2)
Total Protein: 7.2 g/dL (ref 6.0–8.3)

## 2023-03-26 LAB — LIPID PANEL
Cholesterol: 210 mg/dL — ABNORMAL HIGH (ref 0–200)
HDL: 52.4 mg/dL (ref >39.00)
LDL Cholesterol: 133 mg/dL — ABNORMAL HIGH (ref 0–99)
NonHDL: 157.68
Total CHOL/HDL Ratio: 4
Triglycerides: 123 mg/dL (ref 0.0–149.0)
VLDL: 24.6 mg/dL (ref 0.0–40.0)

## 2023-03-26 LAB — HEMOGLOBIN A1C: Hgb A1c MFr Bld: 6.3 % (ref 4.6–6.5)

## 2023-03-26 NOTE — Assessment & Plan Note (Signed)
Pacemaker in place.  Doing well.  No concerns.

## 2023-03-26 NOTE — Assessment & Plan Note (Signed)
No clinical bleeding episodes Fall precautions given Tylenol for pain.  Avoid NSAIDs

## 2023-03-26 NOTE — Assessment & Plan Note (Signed)
Stable.  Advised to stay well-hydrated and avoid NSAIDs. 

## 2023-03-26 NOTE — Assessment & Plan Note (Signed)
BP Readings from Last 3 Encounters:  03/26/23 128/86  03/21/23 (!) 142/74  02/25/23 134/84  Well-controlled hypertension Recently had hydrochlorothiazide dose reduced to 12.5 mg Continue losartan 100 mg daily and metoprolol tartrate 25 mg twice a day

## 2023-03-26 NOTE — Patient Instructions (Signed)

## 2023-03-26 NOTE — Assessment & Plan Note (Addendum)
Stable chronic condition. Lipid profile done today Intolerant to statins

## 2023-03-26 NOTE — Assessment & Plan Note (Signed)
Well-controlled.  Asymptomatic. Continues Protonix 20 mg as needed

## 2023-03-26 NOTE — Assessment & Plan Note (Signed)
Stable.  Diet and nutrition discussed. 

## 2023-03-26 NOTE — Assessment & Plan Note (Signed)
Well-controlled rate.  Sinus rhythm on exam. Continues long-term anticoagulation with Eliquis 5 mg twice a day

## 2023-03-26 NOTE — Progress Notes (Addendum)
Lisa [name] 82 y.o.   Chief Complaint  Patient presents with   Annual Exam    Patient is requesting a physical, no concerns     HISTORY OF PRESENT ILLNESS: This is a 82 y.o. female here for annual exam and follow-up on multiple chronic medical conditions Overall doing well.  Normal blood pressure readings at home. Recently saw cardiologist. Has no complaints or medical concerns today.  HPI   Prior to Admission medications   Medication Sig Start Date End Date Taking? Authorizing Provider  apixaban (ELIQUIS) 5 MG TABS tablet Take 1 tablet (5 mg total) by mouth 2 (two) times daily. 02/25/23  Yes [place], Peter M, MD  Cholecalciferol (VITAMIN D3) 50 MCG (2000 UT) TABS Take 2,000 Units by mouth daily.   Yes [provider]  hydrochlorothiazide (HYDRODIURIL) 12.5 MG tablet Take 1 tablet (12.5 mg total) by mouth daily. 02/26/23  Yes [place], Peter M, MD  losartan (COZAAR) 100 MG tablet Take 1 tablet (100 mg total) by mouth daily. 10/31/22  Yes [place], Peter M, MD  metoprolol tartrate (LOPRESSOR) 25 MG tablet TAKE 1 TABLET BY MOUTH TWICE DAILY. GENERIC EQUIVALENT FOR LOPRESSOR 01/01/23  Yes [place], Peter M, MD  pantoprazole (PROTONIX) 20 MG tablet Take 20 mg by mouth daily as needed (gerd/heartburn). 07/04/18  Yes [provider]  Polyethyl Glycol-Propyl Glycol (SYSTANE) 0.4-0.3 % SOLN Place 1-2 drops into both eyes 3 (three) times daily as needed (dry/irritated eyes.). Patient not taking: Reported on 03/26/2023    [provider]  PREVIDENT 5000 BOOSTER PLUS 1.1 % PSTE Place onto teeth daily. Patient not taking: Reported on 03/26/2023 06/12/22   [provider]    Allergies  Allergen Reactions   Codeine Other (See Comments) and Nausea And Vomiting    GI upset Other reaction(s): stomach upset   Amlodipine Swelling     edema   Fosamax [Alendronate Sodium] Other (See Comments)    Tooth problems   Lisinopril Cough   Olmesartan Other (See Comments)    Possible  photodermatitis     Patient Active Problem List   Diagnosis Date Noted   Thrombophilia (HCC) 09/25/2022   Current use of long term anticoagulation 09/25/2022   Dyslipidemia 03/14/2022   Bartholin cyst 02/02/2022   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   Age-related osteoporosis without current pathological fracture 11/27/2021   Chronic kidney disease, stage 3a (HCC) 11/27/2021   Essential hypertension 11/27/2021   Impaired fasting glucose 11/27/2021   Mixed hyperlipidemia 11/27/2021   Personal history of malignant neoplasm of breast 11/27/2021   Personal history of urinary calculi 11/27/2021   Vitamin D deficiency 11/27/2021   Changing skin lesion 05/12/2021   Encounter for counseling 07/10/2019   Actinic keratosis 02/24/2019   Atrial fibrillation (HCC)    Gastro-esophageal reflux disease without esophagitis    Left bundle branch block    Pilar cyst 04/17/2016    Past Medical History:  Diagnosis Date   Bundle branch block, left    Dyslipidemia    GERD (gastroesophageal reflux disease)    Hypercholesteremia    Hypertension    LBBB (left bundle branch block)    UTI (urinary tract infection)     Past Surgical History:  Procedure Laterality Date   bladder polyp removal     BREAST EXCISIONAL BIOPSY Right    60 yrs ago- Benign   BREAST LUMPECTOMY Left    No Visible Scar, said 30 + years ago in situ, no chemo, no radiation, or hormone replacement  CARDIOVERSION N/A 07/30/2018   Procedure: CARDIOVERSION;  Surgeon: Jake Bathe, MD;  Location: [facility] ENDOSCOPY;  Service: Cardiovascular;  Laterality: N/A;   CARDIOVERSION N/A 11/28/2021   Procedure: CARDIOVERSION;  Surgeon: Marinus Maw, MD;  Location: [facility] INVASIVE CV LAB;  Service: Cardiovascular;  Laterality: N/A;   CESAREAN SECTION     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   Procedure: PACEMAKER IMPLANT;  Surgeon: Marinus Maw, MD;  Location: [facility] INVASIVE CV LAB;  Service: Cardiovascular;  Laterality: N/A;    Social History    Socioeconomic History   Marital status: Married    Spouse name: Not on file   Number of children: 2   Years of education: Not on file   Highest education level: Associate degree: occupational, Scientist, product/process development, or vocational program  Occupational History   Not on file  Tobacco Use   Smoking status: Former    Years: 20    Types: Cigarettes    Quit date: 1993    Years since quitting: 31.5   Smokeless tobacco: Never  Vaping Use   Vaping Use: Never used  Substance and Sexual Activity   Alcohol use: Yes   Drug use: No   Sexual activity: Not on file  Other Topics Concern   Not on file  Social History Narrative   Not on file   Social Determinants of Health   Financial Resource Strain: Low Risk  (03/22/2023)   Overall Financial Resource Strain (CARDIA)    Difficulty of Paying Living Expenses: Not hard at all  Food Insecurity: No Food Insecurity (03/22/2023)   Hunger Vital Sign    Worried About Running Out of Food in the Last Year: Never true    Ran Out of Food in the Last Year: Never true  Transportation Needs: Unknown (03/22/2023)   PRAPARE - Administrator, Civil Service (Medical): Not on file    Lack of Transportation (Non-Medical): No  Physical Activity: Unknown (03/22/2023)   Exercise Vital Sign    Days of Exercise per Week: 5 days    Minutes of Exercise per Session: Not on file  Stress: No Stress Concern Present (03/22/2023)   [employer] of Occupational Health - Occupational Stress Questionnaire    Feeling of Stress : Only a little  Social Connections: Moderately Isolated (03/22/2023)   Social Connection and Isolation Panel [NHANES]    Frequency of Communication with Friends and Family: Twice a week    Frequency of Social Gatherings with Friends and Family: Once a week    Attends Religious Services: Never    Database administrator or Organizations: No    Attends Engineer, structural: Not on file    Marital Status: Married  Catering manager  Violence: Not on file    Family History  Problem Relation Age of Onset   Breast cancer Mother    Hip fracture Mother    Ulcers Father    CVA Brother    Renal cancer Brother    Other Brother        hip replacement   CVA Brother        Had PaceMaker   Crohn's disease Brother    Diabetes Brother      Review of Systems  Constitutional: Negative.  Negative for chills and fever.  HENT: Negative.  Negative for congestion and sore throat.   Respiratory: Negative.  Negative for cough and shortness of breath.   Cardiovascular: Negative.  Negative for chest pain and palpitations.  Gastrointestinal:  Negative  for abdominal pain, diarrhea, nausea and vomiting.  Genitourinary: Negative.  Negative for dysuria and hematuria.  Skin: Negative.  Negative for rash.  Neurological: Negative.  Negative for dizziness and headaches.  All other systems reviewed and are negative.   Today's Vitals   03/26/23 0819  BP: 128/86  Pulse: 60  Temp: 97.9 F (36.6 C)  TempSrc: Oral  SpO2: 98%  Weight: 166 lb (75.3 kg)  Height: 5\' 5"  (1.651 m)   Body mass index is 27.62 kg/m.   Physical Exam Vitals reviewed.  Constitutional:      Appearance: Normal appearance.  HENT:     Head: Normocephalic.     Right Ear: Tympanic membrane, ear canal and external ear normal.     Left Ear: Tympanic membrane, ear canal and external ear normal.     Mouth/Throat:     Mouth: Mucous membranes are moist.     Pharynx: Oropharynx is clear.  Eyes:     Extraocular Movements: Extraocular movements intact.     Conjunctiva/sclera: Conjunctivae normal.     Pupils: Pupils are equal, round, and reactive to light.  Cardiovascular:     Rate and Rhythm: Normal rate and regular rhythm.     Pulses: Normal pulses.     Heart sounds: Normal heart sounds.  Pulmonary:     Effort: Pulmonary effort is normal.     Breath sounds: Normal breath sounds.  Abdominal:     Palpations: Abdomen is soft.     Tenderness: There is no  abdominal tenderness.  Musculoskeletal:     Cervical back: No tenderness.  Lymphadenopathy:     Cervical: No cervical adenopathy.  Skin:    General: Skin is warm and dry.     Capillary Refill: Capillary refill takes less than 2 seconds.  Neurological:     General: No focal deficit present.     Mental Status: She is alert and oriented to person, place, and time.  Psychiatric:        Mood and Affect: Mood normal.        Behavior: Behavior normal.      ASSESSMENT & PLAN: Problem List Items Addressed This Visit       Cardiovascular and Mediastinum   Atrial fibrillation (HCC)    Well-controlled rate.  Sinus rhythm on exam. Continues long-term anticoagulation with Eliquis 5 mg twice a day      Essential hypertension    BP Readings from Last 3 Encounters:  03/26/23 128/86  03/21/23 (!) 142/74  02/25/23 134/84  Well-controlled hypertension Recently had hydrochlorothiazide dose reduced to 12.5 mg Continue losartan 100 mg daily and metoprolol tartrate 25 mg twice a day       Relevant Orders   CBC with Differential (Completed)   Comprehensive metabolic panel (Completed)   Urine Microalbumin w/creat. ratio   Complete heart block (HCC)    Pacemaker in place.  Doing well.  No concerns.        Digestive   Gastro-esophageal reflux disease without esophagitis    Well-controlled.  Asymptomatic. Continues Protonix 20 mg as needed        Genitourinary   Chronic kidney disease, stage 3a (HCC)    Stable.  Advised to stay well-hydrated and avoid NSAIDs        Hematopoietic and Hemostatic   Thrombophilia (HCC)    Continues Eliquis 5 mg twice a day        Other   Dyslipidemia    Stable chronic condition. Lipid profile done today Intolerant  to statins      Relevant Orders   Lipid panel (Completed)   Current use of long term anticoagulation    No clinical bleeding episodes Fall precautions given Tylenol for pain.  Avoid NSAIDs      Prediabetes    Stable.  Diet  and nutrition discussed.      Relevant Orders   Hemoglobin A1c (Completed)   Urine Microalbumin w/creat. ratio   Statin intolerance   Other Visit Diagnoses     Encounter for general adult medical examination with abnormal findings    -  Primary   Relevant Orders   CBC with Differential (Completed)   Comprehensive metabolic panel (Completed)   Hemoglobin A1c (Completed)   Lipid panel (Completed)   Screening for deficiency anemia       Relevant Orders   CBC with Differential (Completed)   Screening for lipoid disorders       Relevant Orders   Lipid panel (Completed)   Screening for endocrine, metabolic and immunity disorder       Relevant Orders   Comprehensive metabolic panel (Completed)   Hemoglobin A1c (Completed)        Modifiable risk factors discussed with patient. Anticipatory guidance according to age provided. The following topics were also discussed: Social Determinants of Health Smoking.  Non-smoker Diet and nutrition.  Good eating habits Benefits of exercise Cancer screening and review of most recent mammogram report from November 2023 Vaccinations reviewed recommendations Cardiovascular risk assessment and need for blood work Review of multiple chronic medical conditions under management Review of all medications Mental health including depression and anxiety Fall and accident prevention  Patient Instructions  Health Maintenance, Female Adopting a healthy lifestyle and getting preventive care are important in promoting health and wellness. Ask your health care provider about: The right schedule for you to have regular tests and exams. Things you can do on your own to prevent diseases and keep yourself healthy. What should I know about diet, weight, and exercise? Eat a healthy diet  Eat a diet that includes plenty of vegetables, fruits, low-fat dairy products, and lean protein. Do not eat a lot of foods that are high in solid fats, added sugars, or  sodium. Maintain a healthy weight Body mass index (BMI) is used to identify weight problems. It estimates body fat based on height and weight. Your health care provider can help determine your BMI and help you achieve or maintain a healthy weight. Get regular exercise Get regular exercise. This is one of the most important things you can do for your health. Most adults should: Exercise for at least 150 minutes each week. The exercise should increase your heart rate and make you sweat (moderate-intensity exercise). Do strengthening exercises at least twice a week. This is in addition to the moderate-intensity exercise. Spend less time sitting. Even light physical activity can be beneficial. Watch cholesterol and blood lipids Have your blood tested for lipids and cholesterol at 81 years of age, then have this test every 5 years. Have your cholesterol levels checked more often if: Your lipid or cholesterol levels are high. You are older than 81 years of age. You are at high risk for heart disease. What should I know about cancer screening? Depending on your health history and family history, you may need to have cancer screening at various ages. This may include screening for: Breast cancer. Cervical cancer. Colorectal cancer. Skin cancer. Lung cancer. What should I know about heart disease, diabetes, and high blood pressure?  Blood pressure and heart disease High blood pressure causes heart disease and increases the risk of stroke. This is more likely to develop in people who have high blood pressure readings or are overweight. Have your blood pressure checked: Every 3-5 years if you are 40-1 years of age. Every year if you are 44 years old or older. Diabetes Have regular diabetes screenings. This checks your fasting blood sugar level. Have the screening done: Once every three years after age 5 if you are at a normal weight and have a low risk for diabetes. More often and at a younger  age if you are overweight or have a high risk for diabetes. What should I know about preventing infection? Hepatitis B If you have a higher risk for hepatitis B, you should be screened for this virus. Talk with your health care provider to find out if you are at risk for hepatitis B infection. Hepatitis C Testing is recommended for: Everyone born from [birth year] through 1964. Anyone with known risk factors for hepatitis C. Sexually transmitted infections (STIs) Get screened for STIs, including gonorrhea and chlamydia, if: You are sexually active and are younger than 81 years of age. You are older than 81 years of age and your health care provider tells you that you are at risk for this type of infection. Your sexual activity has changed since you were last screened, and you are at increased risk for chlamydia or gonorrhea. Ask your health care provider if you are at risk. Ask your health care provider about whether you are at high risk for HIV. Your health care provider may recommend a prescription medicine to help prevent HIV infection. If you choose to take medicine to prevent HIV, you should first get tested for HIV. You should then be tested every 3 months for as long as you are taking the medicine. Pregnancy If you are about to stop having your period (premenopausal) and you may become pregnant, seek counseling before you get pregnant. Take 400 to 800 micrograms (mcg) of folic acid every day if you become pregnant. Ask for birth control (contraception) if you want to prevent pregnancy. Osteoporosis and menopause Osteoporosis is a disease in which the bones lose minerals and strength with aging. This can result in bone fractures. If you are 26 years old or older, or if you are at risk for osteoporosis and fractures, ask your health care provider if you should: Be screened for bone loss. Take a calcium or vitamin D supplement to lower your risk of fractures. Be given hormone replacement therapy  (HRT) to treat symptoms of menopause. Follow these instructions at home: Alcohol use Do not drink alcohol if: Your health care provider tells you not to drink. You are pregnant, may be pregnant, or are planning to become pregnant. If you drink alcohol: Limit how much you have to: 0-1 drink a day. Know how much alcohol is in your drink. In the [place] one drink equals one 12 oz bottle of beer (355 mL), one 5 oz glass of wine (148 mL), or one 1 oz glass of hard liquor (44 mL). Lifestyle Do not use any products that contain nicotine or tobacco. These products include cigarettes, chewing tobacco, and vaping devices, such as e-cigarettes. If you need help quitting, ask your health care provider. Do not use street drugs. Do not share needles. Ask your health care provider for help if you need support or information about quitting drugs. General instructions Schedule regular health, dental, and eye exams.  Stay current with your vaccines. Tell your health care provider if: You often feel depressed. You have ever been abused or do not feel safe at home. Summary Adopting a healthy lifestyle and getting preventive care are important in promoting health and wellness. Follow your health care provider's instructions about healthy diet, exercising, and getting tested or screened for diseases. Follow your health care provider's instructions on monitoring your cholesterol and blood pressure. This information is not intended to replace advice given to you by your health care provider. Make sure you discuss any questions you have with your health care provider. Document Revised: 01/30/2021 Document Reviewed: 01/30/2021 Elsevier Patient Education  2024 Elsevier Inc.      Edwina Barth, MD [facility]

## 2023-03-26 NOTE — Assessment & Plan Note (Signed)
Continues Eliquis 5 mg twice a day

## 2023-04-02 NOTE — Progress Notes (Signed)
Remote pacemaker transmission.   

## 2023-04-03 DIAGNOSIS — Z961 Presence of intraocular lens: Secondary | ICD-10-CM | POA: Diagnosis not present

## 2023-04-03 DIAGNOSIS — H11441 Conjunctival cysts, right eye: Secondary | ICD-10-CM | POA: Diagnosis not present

## 2023-04-03 DIAGNOSIS — H18513 Endothelial corneal dystrophy, bilateral: Secondary | ICD-10-CM | POA: Diagnosis not present

## 2023-04-03 DIAGNOSIS — H26493 Other secondary cataract, bilateral: Secondary | ICD-10-CM | POA: Diagnosis not present

## 2023-04-12 ENCOUNTER — Other Ambulatory Visit: Payer: Self-pay | Admitting: Cardiology

## 2023-04-12 NOTE — Telephone Encounter (Signed)
Prescription refill request for Eliquis received. Indication:afib Last office visit:6/24 Scr:1.25  7/24 Age: 82 Weight:75.3  kg  Prescription refilled

## 2023-04-17 ENCOUNTER — Telehealth: Payer: Self-pay | Admitting: Cardiology

## 2023-04-17 MED ORDER — HYDROCHLOROTHIAZIDE 12.5 MG PO TABS: 12.5000 mg | ORAL_TABLET | Freq: Every day | ORAL | 3 refills | Status: DC

## 2023-04-17 NOTE — Telephone Encounter (Signed)
*  STAT* If patient is at the pharmacy, call can be transferred to refill team.   1. Which medications need to be refilled? (please list name of each medication and dose if known)   hydrochlorothiazide (HYDRODIURIL) 12.5 MG tablet    2. Which pharmacy/location (including street and city if local pharmacy) is medication to be sent to?[facility] - [place], [place] - [address]   3. Do they need a 30 day or 90 day supply? 90 day

## 2023-04-17 NOTE — Telephone Encounter (Signed)
Pt's medication was sent to pt's pharmacy as requested. Confirmation received.  °

## 2023-04-23 ENCOUNTER — Encounter: Payer: Medicare Other | Admitting: Emergency Medicine

## 2023-04-30 ENCOUNTER — Ambulatory Visit (INDEPENDENT_AMBULATORY_CARE_PROVIDER_SITE_OTHER): Payer: Medicare Other

## 2023-04-30 VITALS — BP 128/60 | HR 60 | Temp 97.5°F | Ht 65.0 in | Wt 166.2 lb

## 2023-04-30 DIAGNOSIS — Z0000 Encounter for general adult medical examination without abnormal findings: Secondary | ICD-10-CM | POA: Diagnosis not present

## 2023-04-30 NOTE — Progress Notes (Signed)
Subjective:   Lisa [name] is a 82 y.o. female who presents for an Initial Medicare Annual Wellness Visit.  Visit Complete: In person  Review of Systems     Cardiac Risk Factors include: advanced age (>54men, >42 women);dyslipidemia;hypertension     Objective:    Today's Vitals   04/30/23 0833  BP: 128/60  Pulse: 60  Temp: (!) 97.5 F (36.4 C)  TempSrc: Temporal  SpO2: 96%  Weight: 166 lb 3.2 oz (75.4 kg)  Height: 5\' 5"  (1.651 m)  PainSc: 0-No pain   Body mass index is 27.66 kg/m.     04/30/2023    9:20 AM 11/28/2021   10:41 AM 07/30/2018   11:59 AM  Advanced Directives  Does Patient Have a Medical Advance Directive? Yes Yes No  Type of Estate agent of [place];Living will Healthcare Power of [place];Living will   Does patient want to make changes to medical advance directive?  No - Patient declined   Copy of Healthcare Power of Attorney in Chart? No - copy requested    Would patient like information on creating a medical advance directive?   No - Patient declined    Current Medications (verified) Outpatient Encounter Medications as of 04/30/2023  Medication Sig   apixaban (ELIQUIS) 5 MG TABS tablet TAKE 1 TABLET BY MOUTH TWICE DAILY   Cholecalciferol (VITAMIN D3) 50 MCG (2000 UT) TABS Take 2,000 Units by mouth daily.   hydrochlorothiazide (HYDRODIURIL) 12.5 MG tablet Take 1 tablet (12.5 mg total) by mouth daily.   losartan (COZAAR) 100 MG tablet Take 1 tablet (100 mg total) by mouth daily.   metoprolol tartrate (LOPRESSOR) 25 MG tablet TAKE 1 TABLET BY MOUTH TWICE DAILY. GENERIC EQUIVALENT FOR LOPRESSOR   pantoprazole (PROTONIX) 20 MG tablet Take 20 mg by mouth daily as needed (gerd/heartburn).   Polyethyl Glycol-Propyl Glycol (SYSTANE) 0.4-0.3 % SOLN Place 1-2 drops into both eyes 3 (three) times daily as needed (dry/irritated eyes.). (Patient not taking: Reported on 03/26/2023)   PREVIDENT 5000 BOOSTER PLUS 1.1 % PSTE Place onto teeth daily.  (Patient not taking: Reported on 03/26/2023)   No facility-administered encounter medications on file as of 04/30/2023.    Allergies (verified) Codeine, Amlodipine, Fosamax [alendronate sodium], Lisinopril, and Olmesartan   History: Past Medical History:  Diagnosis Date   Bundle branch block, left    Dyslipidemia    GERD (gastroesophageal reflux disease)    Hypercholesteremia    Hypertension    LBBB (left bundle branch block)    UTI (urinary tract infection)    Past Surgical History:  Procedure Laterality Date   bladder polyp removal     BREAST EXCISIONAL BIOPSY Right    60 yrs ago- Benign   BREAST LUMPECTOMY Left    No Visible Scar, said 30 + years ago in situ, no chemo, no radiation, or hormone replacement     CARDIOVERSION N/A 07/30/2018   Procedure: CARDIOVERSION;  Surgeon: Jake Bathe, MD;  Location: [facility] ENDOSCOPY;  Service: Cardiovascular;  Laterality: N/A;   CARDIOVERSION N/A 11/28/2021   Procedure: CARDIOVERSION;  Surgeon: Marinus Maw, MD;  Location: [facility] INVASIVE CV LAB;  Service: Cardiovascular;  Laterality: N/A;   CESAREAN SECTION     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   Procedure: PACEMAKER IMPLANT;  Surgeon: Marinus Maw, MD;  Location: [facility] INVASIVE CV LAB;  Service: Cardiovascular;  Laterality: N/A;   Family History  Problem Relation Age of Onset   Breast cancer Mother    Hip fracture  Mother    Ulcers Father    CVA Brother    Renal cancer Brother    Other Brother        hip replacement   CVA Brother        Had PaceMaker   Crohn's disease Brother    Diabetes Brother    Social History   Socioeconomic History   Marital status: Married    Spouse name: Not on file   Number of children: 2   Years of education: Not on file   Highest education level: Associate degree: occupational, Scientist, product/process development, or vocational program  Occupational History   Not on file  Tobacco Use   Smoking status: Former    Current packs/day: 0.00    Types: Cigarettes    Start date: 1973     Quit date: 1993    Years since quitting: 31.6   Smokeless tobacco: Never  Vaping Use   Vaping status: Never Used  Substance and Sexual Activity   Alcohol use: Yes   Drug use: No   Sexual activity: Not on file  Other Topics Concern   Not on file  Social History Narrative   Not on file   Social Determinants of Health   Financial Resource Strain: Low Risk  (04/30/2023)   Overall Financial Resource Strain (CARDIA)    Difficulty of Paying Living Expenses: Not hard at all  Food Insecurity: No Food Insecurity (04/30/2023)   Hunger Vital Sign    Worried About Running Out of Food in the Last Year: Never true    Ran Out of Food in the Last Year: Never true  Transportation Needs: No Transportation Needs (04/30/2023)   PRAPARE - Administrator, Civil Service (Medical): No    Lack of Transportation (Non-Medical): No  Physical Activity: Sufficiently Active (04/30/2023)   Exercise Vital Sign    Days of Exercise per Week: 5 days    Minutes of Exercise per Session: 60 min  Stress: No Stress Concern Present (04/30/2023)   [employer] of Occupational Health - Occupational Stress Questionnaire    Feeling of Stress : Not at all  Social Connections: Moderately Isolated (04/30/2023)   Social Connection and Isolation Panel [NHANES]    Frequency of Communication with Friends and Family: Twice a week    Frequency of Social Gatherings with Friends and Family: Once a week    Attends Religious Services: Never    Database administrator or Organizations: No    Attends Engineer, structural: Never    Marital Status: Married    Tobacco Counseling Counseling given: Not Answered   Clinical Intake:  Pre-visit preparation completed: Yes  Pain : No/denies pain Pain Score: 0-No pain     BMI - recorded: 27.66 Nutritional Status: BMI 25 -29 Overweight Nutritional Risks: None Diabetes: No  How often do you need to have someone help you when you read instructions, pamphlets, or  other written materials from your doctor or pharmacy?: 1 - Never What is the last grade level you completed in school?: Some college  Interpreter Needed?: No  Information entered by ::  N. , LPN.   Activities of Daily Living    04/30/2023    9:21 AM  In your present state of health, do you have any difficulty performing the following activities:  Hearing? 0  Vision? 0  Difficulty concentrating or making decisions? 0  Walking or climbing stairs? 0  Dressing or bathing? 0  Doing errands, shopping? 0  Preparing Food  and eating ? N  Using the Toilet? N  In the past six months, have you accidently leaked urine? N  Do you have problems with loss of bowel control? N  Managing your Medications? N  Managing your Finances? N  Housekeeping or managing your Housekeeping? N    Patient Care Team: Georgina Quint, MD as PCP - General (Internal Medicine) [place], Peter M, MD as PCP - Cardiology (Cardiology) Marinus Maw, MD as PCP - Electrophysiology (Cardiology)  Indicate any recent Medical Services you may have received from other than [facility] providers in the past year (date may be approximate).     Assessment:   This is a routine wellness examination for Heathe.  Hearing/Vision screen Hearing Screening - Comments:: Patient denied any hearing difficulty.   No hearing aids.  Vision Screening - Comments:: Patient does wear otc readers. Cataracts remove and lens implanted. [facility] exam done by: [facility]   Dietary issues and exercise activities discussed:     Goals Addressed             This Visit's Progress    Client understands the importance of follow-up with providers by attending scheduled visits. Keep check on my pacemake.        Depression Screen    04/30/2023    9:19 AM 03/26/2023    8:21 AM 09/25/2022    8:10 AM 03/14/2022    8:30 AM  PHQ 2/9 Scores  PHQ - 2 Score 0 0 0 0  PHQ- 9 Score 0       Fall Risk    04/30/2023    9:21 AM 03/26/2023     8:21 AM 09/25/2022    8:10 AM 03/14/2022    8:30 AM  Fall Risk   Falls in the past year? 0 0 0 0  Number falls in past yr: 0 0 0   Injury with Fall? 0 0 0   Risk for fall due to : No Fall Risks No Fall Risks No Fall Risks   Follow up Falls prevention discussed Falls evaluation completed Falls evaluation completed     MEDICARE RISK AT HOME:  Medicare Risk at Home - 04/30/23 8416     Any stairs in or around the home? Yes    If so, are there any without handrails? No    Home free of loose throw rugs in walkways, pet beds, electrical cords, etc? Yes    Adequate lighting in your home to reduce risk of falls? Yes    Life alert? No    Use of a cane, walker or w/c? No    Grab bars in the bathroom? Yes    Shower chair or bench in shower? No    Elevated toilet seat or a handicapped toilet? Yes             TIMED UP AND GO:  Was the test performed? Yes  Length of time to ambulate 10 feet: 8 sec Gait steady and fast without use of assistive device    Cognitive Function:        04/30/2023    9:22 AM  6CIT Screen  What Year? 0 points  What month? 0 points  What time? 0 points  Count back from 20 0 points  Months in reverse 0 points  Repeat phrase 0 points  Total Score 0 points    Immunizations Immunization History  Administered Date(s) Administered   Fluad Quad(high Dose 65+) 07/23/2022   Influenza, High Dose Seasonal  PF 05/26/2014, 05/27/2015, 05/25/2016, 05/29/2017   Influenza-Unspecified 07/05/2010   PNEUMOCOCCAL CONJUGATE-20 03/14/2022   Pneumococcal Conjugate-13 08/18/2014   Pneumococcal Polysaccharide-23 04/20/2009   Td 08/14/2004   Tdap 04/07/2014   Zoster Recombinant(Shingrix) 10/14/2012    TDAP status: Up to date  Flu Vaccine status: Up to date  Pneumococcal vaccine status: Up to date  Covid-19 vaccine status: Completed vaccines  Qualifies for Shingles Vaccine? Yes   Zostavax completed Yes   Shingrix Completed?: No.    Education has been provided  regarding the importance of this vaccine. Patient has been advised to call insurance company to determine out of pocket expense if they have not yet received this vaccine. Advised may also receive vaccine at local pharmacy or Health Dept. Verbalized acceptance and understanding.  Screening Tests Health Maintenance  Topic Date Due   COVID-19 Vaccine (1) Never done   FOOT EXAM  Never done   Zoster Vaccines- Shingrix (2 of 2) 12/09/2012   INFLUENZA VACCINE  04/25/2023   HEMOGLOBIN A1C  09/26/2023   Diabetic kidney evaluation - eGFR measurement  03/25/2024   Diabetic kidney evaluation - Urine ACR  03/25/2024   DTaP/Tdap/Td (3 - Td or Tdap) 04/07/2024   Medicare Annual Wellness (AWV)  04/29/2024   Pneumonia Vaccine 32+ Years old  Completed   DEXA SCAN  Completed   HPV VACCINES  Aged Out   OPHTHALMOLOGY EXAM  Discontinued    Health Maintenance  Health Maintenance Due  Topic Date Due   COVID-19 Vaccine (1) Never done   FOOT EXAM  Never done   Zoster Vaccines- Shingrix (2 of 2) 12/09/2012   INFLUENZA VACCINE  04/25/2023    Colorectal cancer screening: No longer required.   Mammogram status: Completed 08/20/2022. Repeat every year  Bone Density status: Completed 07/08/2017. Results reflect: Bone density results: OSTEOPOROSIS. Repeat every 2 years.  Lung Cancer Screening: (Low Dose CT Chest recommended if Age 6-80 years, 20 pack-year currently smoking OR have quit w/in 15years.) does not qualify.   Lung Cancer Screening Referral: no  Additional Screening:  Hepatitis C Screening: does not qualify; Completed: no  Vision Screening: Recommended annual ophthalmology exams for early detection of glaucoma and other disorders of the eye. Is the patient up to date with their annual eye exam?  Yes  Who is the provider or what is the name of the office in which the patient attends annual eye exams? [facility] If pt is not established with a provider, would they like to be referred to a  provider to establish care? No .   Dental Screening: Recommended annual dental exams for proper oral hygiene  Diabetic Foot Exam: N/A  Community Resource Referral / Chronic Care Management: CRR required this visit?  No   CCM required this visit?  No     Plan:     I have personally reviewed and noted the following in the patient's chart:   Medical and social history Use of alcohol, tobacco or illicit drugs  Current medications and supplements including opioid prescriptions. Patient is not currently taking opioid prescriptions. Functional ability and status Nutritional status Physical activity Advanced directives List of other physicians Hospitalizations, surgeries, and ER visits in previous 12 months Vitals Screenings to include cognitive, depression, and falls Referrals and appointments  In addition, I have reviewed and discussed with patient certain preventive protocols, quality metrics, and best practice recommendations. A written personalized care plan for preventive services as well as general preventive health recommendations were provided to patient.  Mickeal Needy, LPN   03/28/6432   After Visit Summary: Printed and given to patient.  Nurse Notes: Normal cognitive status assessed by direct observation by this Nurse Health Advisor. No abnormalities found.

## 2023-04-30 NOTE — Patient Instructions (Signed)
Lisa [name] , Thank you for taking time to come for your Medicare Wellness Visit. I appreciate your ongoing commitment to your health goals. Please review the following plan we discussed and let me know if I can assist you in the future.   Referrals/Orders/Follow-Ups/Clinician Recommendations: No  This is a list of the screening recommended for you and due dates:  Health Maintenance  Topic Date Due   COVID-19 Vaccine (1) Never done   Complete foot exam   Never done   Zoster (Shingles) Vaccine (2 of 2) 12/09/2012   Medicare Annual Wellness Visit  06/14/2022   Flu Shot  04/25/2023   Hemoglobin A1C  09/26/2023   Yearly kidney function blood test for diabetes  03/25/2024   Yearly kidney health urinalysis for diabetes  03/25/2024   DTaP/Tdap/Td vaccine (3 - Td or Tdap) 04/07/2024   Pneumonia Vaccine  Completed   DEXA scan (bone density measurement)  Completed   HPV Vaccine  Aged Out   Eye exam for diabetics  Discontinued    Advanced directives: (Copy Requested) Please bring a copy of your health care power of attorney and living will to the office to be added to your chart at your convenience.  Next Medicare Annual Wellness Visit scheduled for next year: No  Preventive Care 81 Years and Older, Female Preventive care refers to lifestyle choices and visits with your health care provider that can promote health and wellness. What does preventive care include? A yearly physical exam. This is also called an annual well check. Dental exams once or twice a year. Routine eye exams. Ask your health care provider how often you should have your eyes checked. Personal lifestyle choices, including: Daily care of your teeth and gums. Regular physical activity. Eating a healthy diet. Avoiding tobacco and drug use. Limiting alcohol use. Practicing safe sex. Taking low-dose aspirin every day. Taking vitamin and mineral supplements as recommended by your health care provider. What happens during  an annual well check? The services and screenings done by your health care provider during your annual well check will depend on your age, overall health, lifestyle risk factors, and family history of disease. Counseling  Your health care provider may ask you questions about your: Alcohol use. Tobacco use. Drug use. Emotional well-being. Home and relationship well-being. Sexual activity. Eating habits. History of falls. Memory and ability to understand (cognition). Work and work Astronomer. Reproductive health. Screening  You may have the following tests or measurements: Height, weight, and BMI. Blood pressure. Lipid and cholesterol levels. These may be checked every 5 years, or more frequently if you are over 55 years old. Skin check. Lung cancer screening. You may have this screening every year starting at age 81 if you have a 30-pack-year history of smoking and currently smoke or have quit within the past 15 years. Fecal occult blood test (FOBT) of the stool. You may have this test every year starting at age 81. Flexible sigmoidoscopy or colonoscopy. You may have a sigmoidoscopy every 5 years or a colonoscopy every 10 years starting at age 81. Hepatitis C blood test. Hepatitis B blood test. Sexually transmitted disease (STD) testing. Diabetes screening. This is done by checking your blood sugar (glucose) after you have not eaten for a while (fasting). You may have this done every 1-3 years. Bone density scan. This is done to screen for osteoporosis. You may have this done starting at age 81. Mammogram. This may be done every 1-2 years. Talk to your health care provider  about how often you should have regular mammograms. Talk with your health care provider about your test results, treatment options, and if necessary, the need for more tests. Vaccines  Your health care provider may recommend certain vaccines, such as: Influenza vaccine. This is recommended every year. Tetanus,  diphtheria, and acellular pertussis (Tdap, Td) vaccine. You may need a Td booster every 10 years. Zoster vaccine. You may need this after age 75. Pneumococcal 13-valent conjugate (PCV13) vaccine. One dose is recommended after age 81. Pneumococcal polysaccharide (PPSV23) vaccine. One dose is recommended after age 81. Talk to your health care provider about which screenings and vaccines you need and how often you need them. This information is not intended to replace advice given to you by your health care provider. Make sure you discuss any questions you have with your health care provider. Document Released: 10/07/2015 Document Revised: 05/30/2016 Document Reviewed: 07/12/2015 Elsevier Interactive Patient Education  2017 [employer].  Fall Prevention in the Home Falls can cause injuries. They can happen to people of all ages. There are many things you can do to make your home safe and to help prevent falls. What can I do on the outside of my home? Regularly fix the edges of walkways and driveways and fix any cracks. Remove anything that might make you trip as you walk through a door, such as a raised step or threshold. Trim any bushes or trees on the path to your home. Use bright outdoor lighting. Clear any walking paths of anything that might make someone trip, such as rocks or tools. Regularly check to see if handrails are loose or broken. Make sure that both sides of any steps have handrails. Any raised decks and porches should have guardrails on the edges. Have any leaves, snow, or ice cleared regularly. Use sand or salt on walking paths during winter. Clean up any spills in your garage right away. This includes oil or grease spills. What can I do in the bathroom? Use night lights. Install grab bars by the toilet and in the tub and shower. Do not use towel bars as grab bars. Use non-skid mats or decals in the tub or shower. If you need to sit down in the shower, use a plastic,  non-slip stool. Keep the floor dry. Clean up any water that spills on the floor as soon as it happens. Remove soap buildup in the tub or shower regularly. Attach bath mats securely with double-sided non-slip rug tape. Do not have throw rugs and other things on the floor that can make you trip. What can I do in the bedroom? Use night lights. Make sure that you have a light by your bed that is easy to reach. Do not use any sheets or blankets that are too big for your bed. They should not hang down onto the floor. Have a firm chair that has side arms. You can use this for support while you get dressed. Do not have throw rugs and other things on the floor that can make you trip. What can I do in the kitchen? Clean up any spills right away. Avoid walking on wet floors. Keep items that you use a lot in easy-to-reach places. If you need to reach something above you, use a strong step stool that has a grab bar. Keep electrical cords out of the way. Do not use floor polish or wax that makes floors slippery. If you must use wax, use non-skid floor wax. Do not have throw rugs and  other things on the floor that can make you trip. What can I do with my stairs? Do not leave any items on the stairs. Make sure that there are handrails on both sides of the stairs and use them. Fix handrails that are broken or loose. Make sure that handrails are as long as the stairways. Check any carpeting to make sure that it is firmly attached to the stairs. Fix any carpet that is loose or worn. Avoid having throw rugs at the top or bottom of the stairs. If you do have throw rugs, attach them to the floor with carpet tape. Make sure that you have a light switch at the top of the stairs and the bottom of the stairs. If you do not have them, ask someone to add them for you. What else can I do to help prevent falls? Wear shoes that: Do not have high heels. Have rubber bottoms. Are comfortable and fit you well. Are closed  at the toe. Do not wear sandals. If you use a stepladder: Make sure that it is fully opened. Do not climb a closed stepladder. Make sure that both sides of the stepladder are locked into place. Ask someone to hold it for you, if possible. Clearly mark and make sure that you can see: Any grab bars or handrails. First and last steps. Where the edge of each step is. Use tools that help you move around (mobility aids) if they are needed. These include: Canes. Walkers. Scooters. Crutches. Turn on the lights when you go into a dark area. Replace any light bulbs as soon as they burn out. Set up your furniture so you have a clear path. Avoid moving your furniture around. If any of your floors are uneven, fix them. If there are any pets around you, be aware of where they are. Review your medicines with your doctor. Some medicines can make you feel dizzy. This can increase your chance of falling. Ask your doctor what other things that you can do to help prevent falls. This information is not intended to replace advice given to you by your health care provider. Make sure you discuss any questions you have with your health care provider. Document Released: 07/07/2009 Document Revised: 02/16/2016 Document Reviewed: 10/15/2014 Elsevier Interactive Patient Education  2017 [employer].

## 2023-05-02 ENCOUNTER — Encounter: Payer: Self-pay | Admitting: Emergency Medicine

## 2023-05-02 ENCOUNTER — Ambulatory Visit (INDEPENDENT_AMBULATORY_CARE_PROVIDER_SITE_OTHER): Payer: Medicare Other | Admitting: Emergency Medicine

## 2023-05-02 VITALS — BP 132/84 | HR 62 | Temp 98.3°F | Ht 65.0 in | Wt 168.0 lb

## 2023-05-02 DIAGNOSIS — N390 Urinary tract infection, site not specified: Secondary | ICD-10-CM | POA: Diagnosis not present

## 2023-05-02 DIAGNOSIS — R300 Dysuria: Secondary | ICD-10-CM | POA: Diagnosis not present

## 2023-05-02 DIAGNOSIS — R399 Unspecified symptoms and signs involving the genitourinary system: Secondary | ICD-10-CM | POA: Diagnosis not present

## 2023-05-02 LAB — URINALYSIS
Bilirubin Urine: NEGATIVE
Ketones, ur: NEGATIVE
Leukocytes,Ua: NEGATIVE
Nitrite: NEGATIVE
Specific Gravity, Urine: 1.01 (ref 1.000–1.030)
Total Protein, Urine: NEGATIVE
Urine Glucose: NEGATIVE
Urobilinogen, UA: 0.2 (ref 0.0–1.0)
pH: 6 (ref 5.0–8.0)

## 2023-05-02 LAB — POC URINALSYSI DIPSTICK (AUTOMATED)
Bilirubin, UA: NEGATIVE
Glucose, UA: NEGATIVE
Ketones, UA: NEGATIVE
Leukocytes, UA: NEGATIVE
Nitrite, UA: NEGATIVE
Protein, UA: NEGATIVE
Spec Grav, UA: 1.01 (ref 1.010–1.025)
Urobilinogen, UA: 0.2 E.U./dL
pH, UA: 6 (ref 5.0–8.0)

## 2023-05-02 MED ORDER — CIPROFLOXACIN HCL 500 MG PO TABS
500.0000 mg | ORAL_TABLET | Freq: Two times a day (BID) | ORAL | 1 refills | Status: DC
Start: 2023-05-02 — End: 2023-05-05

## 2023-05-02 NOTE — Assessment & Plan Note (Signed)
Secondary to bladder infection. Recommend to rest and stay well-hydrated Start antibiotics as prescribed

## 2023-05-02 NOTE — Assessment & Plan Note (Signed)
Partially treated. Urine sent for culture today Recommend to continue Cipro 500 mg twice a day for 7 days Clinically stable.  No signs of pyelonephritis. No red flag signs or symptoms.  Benign examination.

## 2023-05-02 NOTE — Progress Notes (Signed)
Lisa [name] 82 y.o.   Chief Complaint  Patient presents with   Dysuria    Pt stated it started 8/6 and has been frequently using the bathroom, she states she took a cipro Tuesday morning and Wednesday and that did help.     HISTORY OF PRESENT ILLNESS: This is a 82 y.o. female complaining of burning on urination with frequency that started last week.  Took leftover Cipro x 2 and is feeling better Denies nausea or vomiting, flank pain, fever or chills.  No other associated symptoms No other complaints or medical concerns today   HPI   Prior to Admission medications   Medication Sig Start Date End Date Taking? Authorizing Provider  apixaban (ELIQUIS) 5 MG TABS tablet TAKE 1 TABLET BY MOUTH TWICE DAILY 04/12/23  Yes [place], Peter M, MD  Cholecalciferol (VITAMIN D3) 50 MCG (2000 UT) TABS Take 2,000 Units by mouth daily.   Yes [provider]  hydrochlorothiazide (HYDRODIURIL) 12.5 MG tablet Take 1 tablet (12.5 mg total) by mouth daily. 04/17/23  Yes [place], Peter M, MD  losartan (COZAAR) 100 MG tablet Take 1 tablet (100 mg total) by mouth daily. 10/31/22  Yes [place], Peter M, MD  metoprolol tartrate (LOPRESSOR) 25 MG tablet TAKE 1 TABLET BY MOUTH TWICE DAILY. GENERIC EQUIVALENT FOR LOPRESSOR 01/01/23  Yes [place], Peter M, MD  pantoprazole (PROTONIX) 20 MG tablet Take 20 mg by mouth daily as needed (gerd/heartburn). 07/04/18  Yes [provider]  Polyethyl Glycol-Propyl Glycol (SYSTANE) 0.4-0.3 % SOLN Place 1-2 drops into both eyes 3 (three) times daily as needed (dry/irritated eyes.).   Yes [provider]  PREVIDENT 5000 BOOSTER PLUS 1.1 % PSTE Place onto teeth daily. Patient not taking: Reported on 03/26/2023 06/12/22   [provider]    Allergies  Allergen Reactions   Codeine Other (See Comments) and Nausea And Vomiting    GI upset Other reaction(s): stomach upset   Amlodipine Swelling     edema   Fosamax [Alendronate Sodium] Other (See Comments)     Tooth problems   Lisinopril Cough   Olmesartan Other (See Comments)    Possible photodermatitis     Patient Active Problem List   Diagnosis Date Noted   Prediabetes 03/26/2023   Statin intolerance 03/26/2023   Thrombophilia (HCC) 09/25/2022   Current use of long term anticoagulation 09/25/2022   Dyslipidemia 03/14/2022   Bartholin cyst 02/02/2022   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   Age-related osteoporosis without current pathological fracture 11/27/2021   Chronic kidney disease, stage 3a (HCC) 11/27/2021   Essential hypertension 11/27/2021   Impaired fasting glucose 11/27/2021   Mixed hyperlipidemia 11/27/2021   Personal history of malignant neoplasm of breast 11/27/2021   Personal history of urinary calculi 11/27/2021   Vitamin D deficiency 11/27/2021   Changing skin lesion 05/12/2021   Encounter for counseling 07/10/2019   Actinic keratosis 02/24/2019   Atrial fibrillation (HCC)    Gastro-esophageal reflux disease without esophagitis    Left bundle branch block    Pilar cyst 04/17/2016    Past Medical History:  Diagnosis Date   Bundle branch block, left    Dyslipidemia    GERD (gastroesophageal reflux disease)    Hypercholesteremia    Hypertension    LBBB (left bundle branch block)    UTI (urinary tract infection)     Past Surgical History:  Procedure Laterality Date   bladder polyp removal     BREAST EXCISIONAL BIOPSY Right    60 yrs  ago- Benign   BREAST LUMPECTOMY Left    No Visible Scar, said 30 + years ago in situ, no chemo, no radiation, or hormone replacement     CARDIOVERSION N/A 07/30/2018   Procedure: CARDIOVERSION;  Surgeon: Jake Bathe, MD;  Location: [facility] ENDOSCOPY;  Service: Cardiovascular;  Laterality: N/A;   CARDIOVERSION N/A 11/28/2021   Procedure: CARDIOVERSION;  Surgeon: Marinus Maw, MD;  Location: [facility] INVASIVE CV LAB;  Service: Cardiovascular;  Laterality: N/A;   CESAREAN SECTION     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   Procedure:  PACEMAKER IMPLANT;  Surgeon: Marinus Maw, MD;  Location: [facility] INVASIVE CV LAB;  Service: Cardiovascular;  Laterality: N/A;    Social History   Socioeconomic History   Marital status: Married    Spouse name: Not on file   Number of children: 2   Years of education: Not on file   Highest education level: Associate degree: occupational, Scientist, product/process development, or vocational program  Occupational History   Not on file  Tobacco Use   Smoking status: Former    Current packs/day: 0.00    Types: Cigarettes    Start date: 55    Quit date: 1993    Years since quitting: 31.6   Smokeless tobacco: Never  Vaping Use   Vaping status: Never Used  Substance and Sexual Activity   Alcohol use: Yes   Drug use: No   Sexual activity: Not on file  Other Topics Concern   Not on file  Social History Narrative   Not on file   Social Determinants of Health   Financial Resource Strain: Low Risk  (04/30/2023)   Overall Financial Resource Strain (CARDIA)    Difficulty of Paying Living Expenses: Not hard at all  Food Insecurity: No Food Insecurity (04/30/2023)   Hunger Vital Sign    Worried About Running Out of Food in the Last Year: Never true    Ran Out of Food in the Last Year: Never true  Transportation Needs: No Transportation Needs (04/30/2023)   PRAPARE - Administrator, Civil Service (Medical): No    Lack of Transportation (Non-Medical): No  Physical Activity: Sufficiently Active (04/30/2023)   Exercise Vital Sign    Days of Exercise per Week: 5 days    Minutes of Exercise per Session: 60 min  Stress: No Stress Concern Present (04/30/2023)   [employer] of Occupational Health - Occupational Stress Questionnaire    Feeling of Stress : Not at all  Social Connections: Moderately Isolated (04/30/2023)   Social Connection and Isolation Panel [NHANES]    Frequency of Communication with Friends and Family: Twice a week    Frequency of Social Gatherings with Friends and Family: Once a week     Attends Religious Services: Never    Database administrator or Organizations: No    Attends Banker Meetings: Never    Marital Status: Married  Catering manager Violence: Not At Risk (04/30/2023)   Humiliation, Afraid, Rape, and Kick questionnaire    Fear of Current or Ex-Partner: No    Emotionally Abused: No    Physically Abused: No    Sexually Abused: No    Family History  Problem Relation Age of Onset   Breast cancer Mother    Hip fracture Mother    Ulcers Father    CVA Brother    Renal cancer Brother    Other Brother        hip replacement   CVA Brother  Had PaceMaker   Crohn's disease Brother    Diabetes Brother      Review of Systems  Constitutional: Negative.  Negative for chills and fever.  HENT: Negative.  Negative for congestion and sore throat.   Respiratory: Negative.  Negative for cough and shortness of breath.   Cardiovascular: Negative.  Negative for chest pain and palpitations.  Gastrointestinal:  Negative for abdominal pain, nausea and vomiting.  Genitourinary:  Positive for dysuria and frequency. Negative for flank pain and hematuria.  Skin: Negative.  Negative for rash.  Neurological:  Negative for dizziness and headaches.  All other systems reviewed and are negative.   Vitals:   05/02/23 1452  BP: 132/84  Pulse: 62  Temp: 98.3 F (36.8 C)  SpO2: 95%    Physical Exam Vitals reviewed.  HENT:     Head: Normocephalic.  Eyes:     Extraocular Movements: Extraocular movements intact.     Pupils: Pupils are equal, round, and reactive to light.  Cardiovascular:     Rate and Rhythm: Normal rate and regular rhythm.     Pulses: Normal pulses.     Heart sounds: Normal heart sounds.  Pulmonary:     Effort: Pulmonary effort is normal.     Breath sounds: Normal breath sounds.  Abdominal:     Palpations: Abdomen is soft.     Tenderness: There is no abdominal tenderness. There is no right CVA tenderness or left CVA tenderness.   Musculoskeletal:     Cervical back: No tenderness.  Lymphadenopathy:     Cervical: No cervical adenopathy.  Skin:    General: Skin is warm and dry.     Capillary Refill: Capillary refill takes less than 2 seconds.  Neurological:     General: No focal deficit present.     Mental Status: She is alert and oriented to person, place, and time.  Psychiatric:        Mood and Affect: Mood normal.        Behavior: Behavior normal.    Results for orders placed or performed in visit on 05/02/23 (from the past 24 hour(s))  POCT Urinalysis Dipstick (Automated)     Status: None   Collection Time: 05/02/23  3:23 PM  Result Value Ref Range   Color, UA yellow    Clarity, UA clear    Glucose, UA Negative Negative   Bilirubin, UA negative    Ketones, UA negative    Spec Grav, UA 1.010 1.010 - 1.025   Blood, UA postitive    pH, UA 6.0 5.0 - 8.0   Protein, UA Negative Negative   Urobilinogen, UA 0.2 0.2 or 1.0 E.U./dL   Nitrite, UA negative    Leukocytes, UA Negative Negative  Urinalysis     Status: Abnormal   Collection Time: 05/02/23  3:40 PM  Result Value Ref Range   Color, Urine YELLOW Yellow;Lt. Yellow;Straw;Dark Yellow;Amber;Green;Red;Brown   APPearance CLEAR Clear;Turbid;Slightly Cloudy;Cloudy   Specific Gravity, Urine 1.010 1.000 - 1.030   pH 6.0 5.0 - 8.0   Total Protein, Urine NEGATIVE Negative   Urine Glucose NEGATIVE Negative   Ketones, ur NEGATIVE Negative   Bilirubin Urine NEGATIVE Negative   Hgb urine dipstick TRACE-INTACT (A) Negative   Urobilinogen, UA 0.2 0.0 - 1.0   Leukocytes,Ua NEGATIVE Negative   Nitrite NEGATIVE Negative     ASSESSMENT & PLAN: A total of 33 minutes was spent with the patient and counseling/coordination of care regarding preparing for this visit, review of most recent  office visit notes, review of most recent blood work results, review of chronic medical conditions under management, review of all medications, diagnosis of urinary tract infection  and need to take antibiotics, prognosis, documentation, ED precautions, and need for follow-up if no better or worse during the next several days.  Problem List Items Addressed This Visit       Genitourinary   Acute UTI    Partially treated. Urine sent for culture today Recommend to continue Cipro 500 mg twice a day for 7 days Clinically stable.  No signs of pyelonephritis. No red flag signs or symptoms.  Benign examination.      Relevant Medications   ciprofloxacin (CIPRO) 500 MG tablet     Other   Lower urinary tract symptoms - Primary    Secondary to bladder infection. Recommend to rest and stay well-hydrated Start antibiotics as prescribed      Relevant Orders   Urinalysis (Completed)   Urine Culture   Dysuria    Unremarkable urinalysis due to partially treated infection May take over-the-counter Azo as needed Continue Cipro 500 mg twice a day to complete 7 days Advised to stay well-hydrated Advised to contact the office if no better or worse during the next several days      Relevant Orders   POCT Urinalysis Dipstick (Automated) (Completed)   Patient Instructions  Urinary Tract Infection, Adult A urinary tract infection (UTI) is an infection of any part of the urinary tract. The urinary tract includes: The kidneys. The ureters. The bladder. The urethra. These organs make, store, and get rid of pee (urine) in the body. What are the causes? This infection is caused by germs (bacteria) in your genital area. These germs grow and cause swelling (inflammation) of your urinary tract. What increases the risk? The following factors may make you more likely to develop this condition: Using a small, thin tube (catheter) to drain pee. Not being able to control when you pee or poop (incontinence). Being female. If you are female, these things can increase the risk: Using these methods to prevent pregnancy: A medicine that kills sperm (spermicide). A device that blocks  sperm (diaphragm). Having low levels of a female hormone (estrogen). Being pregnant. You are more likely to develop this condition if: You have genes that add to your risk. You are sexually active. You take antibiotic medicines. You have trouble peeing because of: A prostate that is bigger than normal, if you are female. A blockage in the part of your body that drains pee from the bladder. A kidney stone. A nerve condition that affects your bladder. Not getting enough to drink. Not peeing often enough. You have other conditions, such as: Diabetes. A weak disease-fighting system (immune system). Sickle cell disease. Gout. Injury of the spine. What are the signs or symptoms? Symptoms of this condition include: Needing to pee right away. Peeing small amounts often. Pain or burning when peeing. Blood in the pee. Pee that smells bad or not like normal. Trouble peeing. Pee that is cloudy. Fluid coming from the vagina, if you are female. Pain in the belly or lower back. Other symptoms include: Vomiting. Not feeling hungry. Feeling mixed up (confused). This may be the first symptom in older adults. Being tired and grouchy (irritable). A fever. Watery poop (diarrhea). How is this treated? Taking antibiotic medicine. Taking other medicines. Drinking enough water. In some cases, you may need to see a specialist. Follow these instructions at home:  Medicines Take over-the-counter and prescription medicines  only as told by your doctor. If you were prescribed an antibiotic medicine, take it as told by your doctor. Do not stop taking it even if you start to feel better. General instructions Make sure you: Pee until your bladder is empty. Do not hold pee for a long time. Empty your bladder after sex. Wipe from front to back after peeing or pooping if you are a female. Use each tissue one time when you wipe. Drink enough fluid to keep your pee pale yellow. Keep all follow-up  visits. Contact a doctor if: You do not get better after 1-2 days. Your symptoms go away and then come back. Get help right away if: You have very bad back pain. You have very bad pain in your lower belly. You have a fever. You have chills. You feeling like you will vomit or you vomit. Summary A urinary tract infection (UTI) is an infection of any part of the urinary tract. This condition is caused by germs in your genital area. There are many risk factors for a UTI. Treatment includes antibiotic medicines. Drink enough fluid to keep your pee pale yellow. This information is not intended to replace advice given to you by your health care provider. Make sure you discuss any questions you have with your health care provider. Document Revised: 04/17/2020 Document Reviewed: 04/22/2020 Elsevier Patient Education  2024 Elsevier Inc.      Edwina Barth, MD [facility]

## 2023-05-02 NOTE — Patient Instructions (Signed)
Urinary Tract Infection, Adult A urinary tract infection (UTI) is an infection of any part of the urinary tract. The urinary tract includes: The kidneys. The ureters. The bladder. The urethra. These organs make, store, and get rid of pee (urine) in the body. What are the causes? This infection is caused by germs (bacteria) in your genital area. These germs grow and cause swelling (inflammation) of your urinary tract. What increases the risk? The following factors may make you more likely to develop this condition: Using a small, thin tube (catheter) to drain pee. Not being able to control when you pee or poop (incontinence). Being female. If you are female, these things can increase the risk: Using these methods to prevent pregnancy: A medicine that kills sperm (spermicide). A device that blocks sperm (diaphragm). Having low levels of a female hormone (estrogen). Being pregnant. You are more likely to develop this condition if: You have genes that add to your risk. You are sexually active. You take antibiotic medicines. You have trouble peeing because of: A prostate that is bigger than normal, if you are female. A blockage in the part of your body that drains pee from the bladder. A kidney stone. A nerve condition that affects your bladder. Not getting enough to drink. Not peeing often enough. You have other conditions, such as: Diabetes. A weak disease-fighting system (immune system). Sickle cell disease. Gout. Injury of the spine. What are the signs or symptoms? Symptoms of this condition include: Needing to pee right away. Peeing small amounts often. Pain or burning when peeing. Blood in the pee. Pee that smells bad or not like normal. Trouble peeing. Pee that is cloudy. Fluid coming from the vagina, if you are female. Pain in the belly or lower back. Other symptoms include: Vomiting. Not feeling hungry. Feeling mixed up (confused). This may be the first symptom in  older adults. Being tired and grouchy (irritable). A fever. Watery poop (diarrhea). How is this treated? Taking antibiotic medicine. Taking other medicines. Drinking enough water. In some cases, you may need to see a specialist. Follow these instructions at home:  Medicines Take over-the-counter and prescription medicines only as told by your doctor. If you were prescribed an antibiotic medicine, take it as told by your doctor. Do not stop taking it even if you start to feel better. General instructions Make sure you: Pee until your bladder is empty. Do not hold pee for a long time. Empty your bladder after sex. Wipe from front to back after peeing or pooping if you are a female. Use each tissue one time when you wipe. Drink enough fluid to keep your pee pale yellow. Keep all follow-up visits. Contact a doctor if: You do not get better after 1-2 days. Your symptoms go away and then come back. Get help right away if: You have very bad back pain. You have very bad pain in your lower belly. You have a fever. You have chills. You feeling like you will vomit or you vomit. Summary A urinary tract infection (UTI) is an infection of any part of the urinary tract. This condition is caused by germs in your genital area. There are many risk factors for a UTI. Treatment includes antibiotic medicines. Drink enough fluid to keep your pee pale yellow. This information is not intended to replace advice given to you by your health care provider. Make sure you discuss any questions you have with your health care provider. Document Revised: 04/17/2020 Document Reviewed: 04/22/2020 Elsevier Patient Education    2024 Elsevier Inc.  

## 2023-05-02 NOTE — Assessment & Plan Note (Signed)
Unremarkable urinalysis due to partially treated infection May take over-the-counter Azo as needed Continue Cipro 500 mg twice a day to complete 7 days Advised to stay well-hydrated Advised to contact the office if no better or worse during the next several days

## 2023-05-05 ENCOUNTER — Other Ambulatory Visit: Payer: Self-pay | Admitting: Emergency Medicine

## 2023-05-05 DIAGNOSIS — N390 Urinary tract infection, site not specified: Secondary | ICD-10-CM

## 2023-05-05 MED ORDER — CEFUROXIME AXETIL 250 MG PO TABS
250.0000 mg | ORAL_TABLET | Freq: Two times a day (BID) | ORAL | 0 refills | Status: AC
Start: 2023-05-05 — End: 2023-05-12

## 2023-05-20 DIAGNOSIS — K080 Exfoliation of teeth due to systemic causes: Secondary | ICD-10-CM | POA: Diagnosis not present

## 2023-05-23 DIAGNOSIS — K080 Exfoliation of teeth due to systemic causes: Secondary | ICD-10-CM | POA: Diagnosis not present

## 2023-06-03 ENCOUNTER — Ambulatory Visit: Payer: Medicare Other | Admitting: Cardiology

## 2023-06-04 ENCOUNTER — Ambulatory Visit: Payer: Medicare Other

## 2023-06-04 DIAGNOSIS — I442 Atrioventricular block, complete: Secondary | ICD-10-CM | POA: Diagnosis not present

## 2023-06-04 LAB — CUP PACEART REMOTE DEVICE CHECK
Battery Voltage: 90
Date Time Interrogation Session: 20240910103000
Implantable Lead Connection Status: 753985
Implantable Lead Connection Status: 753985
Implantable Lead Implant Date: 20230313
Implantable Lead Implant Date: 20230313
Implantable Lead Location: 753859
Implantable Lead Location: 753860
Implantable Lead Model: 377171
Implantable Lead Model: 377171
Implantable Lead Serial Number: 7000407809
Implantable Lead Serial Number: 8000780983
Implantable Pulse Generator Implant Date: 20230313
Pulse Gen Model: 407145
Pulse Gen Serial Number: 70364045

## 2023-06-20 NOTE — Progress Notes (Signed)
Remote pacemaker transmission.   

## 2023-06-24 DIAGNOSIS — L821 Other seborrheic keratosis: Secondary | ICD-10-CM | POA: Diagnosis not present

## 2023-06-24 DIAGNOSIS — L578 Other skin changes due to chronic exposure to nonionizing radiation: Secondary | ICD-10-CM | POA: Diagnosis not present

## 2023-06-24 DIAGNOSIS — Z85828 Personal history of other malignant neoplasm of skin: Secondary | ICD-10-CM | POA: Diagnosis not present

## 2023-06-24 DIAGNOSIS — L820 Inflamed seborrheic keratosis: Secondary | ICD-10-CM | POA: Diagnosis not present

## 2023-06-24 DIAGNOSIS — L814 Other melanin hyperpigmentation: Secondary | ICD-10-CM | POA: Diagnosis not present

## 2023-07-19 ENCOUNTER — Other Ambulatory Visit: Payer: Self-pay | Admitting: Emergency Medicine

## 2023-07-19 DIAGNOSIS — Z1231 Encounter for screening mammogram for malignant neoplasm of breast: Secondary | ICD-10-CM

## 2023-08-21 NOTE — Progress Notes (Signed)
Cardiology Office Note:    Date:  08/27/2023   ID:  Lisa [name], DOB 1941-02-04, MRN [id number]  PCP:  Georgina Quint, MD [facility] Cardiologist: Ova Meegan [place], MD   Reason for visit: Follow-up ED visit for HTN  History of Present Illness:    Lisa [name] is a 82 y.o. female with a hx of atrial fibrillation s/p DCCV in 07/2018 (sx SOB, swelling, indigestion), hypertension, hyperlipidemia, left bundle branch block.   Myoview in January 2019 was normal.    She was seen in ED at [facility] in Jan 2023. She had an episode of syncope at home. Following this she had acute N/V and diarrhea.  Ecg showed NSR with chronic LBBB. BP was quite high to 202/110. Labs including troponins were negative. She hasn't had any further dizziness or syncope but has persistent elevated BP.  She was on metoprolol, losartan and HCTZ.  When seen in Feb she was in NSR. Later presented in March with recurrent Afib. She underwent successful DCCV. 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 She was admitted and underwent placement of dual chamber pacemaker by Dr Ladona Ridgel. Echo was unremarkable. She was DC on metoprolol and Eliquis. Prior renal artery duplex was negative for RAS.   Pacemaker follow up in September was normal.   On follow up today she is doing very well. Reports BP at home has been well controlled- typically 130-135 systolic. We had previously reduced HCT due to orthostasis. Notes she has a torn meniscus in her knee. Being treated by ortho.      Past Medical History:  Diagnosis Date   Bundle branch block, left    Dyslipidemia    GERD (gastroesophageal reflux disease)    Hypercholesteremia    Hypertension    LBBB (left bundle branch block)    UTI (urinary tract infection)     Past Surgical History:  Procedure Laterality Date   bladder polyp removal     BREAST EXCISIONAL BIOPSY Right    60 yrs ago- Benign    BREAST LUMPECTOMY Left    No Visible Scar, said 30 + years ago in situ, no chemo, no radiation, or hormone replacement     CARDIOVERSION N/A 07/30/2018   Procedure: CARDIOVERSION;  Surgeon: Jake Bathe, MD;  Location: [facility] ENDOSCOPY;  Service: Cardiovascular;  Laterality: N/A;   CARDIOVERSION N/A 11/28/2021   Procedure: CARDIOVERSION;  Surgeon: Marinus Maw, MD;  Location: [facility] INVASIVE CV LAB;  Service: Cardiovascular;  Laterality: N/A;   CESAREAN SECTION     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   Procedure: PACEMAKER IMPLANT;  Surgeon: Marinus Maw, MD;  Location: [facility] INVASIVE CV LAB;  Service: Cardiovascular;  Laterality: N/A;    Current Medications: Current Meds  Medication Sig   apixaban (ELIQUIS) 5 MG TABS tablet TAKE 1 TABLET BY MOUTH TWICE DAILY   Cholecalciferol (VITAMIN D3) 50 MCG (2000 UT) TABS Take 2,000 Units by mouth daily.   hydrochlorothiazide (HYDRODIURIL) 12.5 MG tablet Take 1 tablet (12.5 mg total) by mouth daily.   losartan (COZAAR) 100 MG tablet Take 1 tablet (100 mg total) by mouth daily.   metoprolol tartrate (LOPRESSOR) 25 MG tablet TAKE 1 TABLET BY MOUTH TWICE DAILY. GENERIC EQUIVALENT FOR LOPRESSOR   pantoprazole (PROTONIX) 20 MG tablet Take 20 mg by mouth daily as needed (gerd/heartburn).     Allergies:   Codeine, Amlodipine, Fosamax [alendronate sodium], Lisinopril, and Olmesartan  Social History   Socioeconomic History   Marital status: Married    Spouse name: Not on file   Number of children: 2   Years of education: Not on file   Highest education level: Associate degree: occupational, Scientist, product/process development, or vocational program  Occupational History   Not on file  Tobacco Use   Smoking status: Former    Current packs/day: 0.00    Types: Cigarettes    Start date: 81    Quit date: 1993    Years since quitting: 31.9   Smokeless tobacco: Never  Vaping Use   Vaping status: Never Used  Substance and Sexual Activity   Alcohol use: Yes   Drug use: No   Sexual  activity: Not on file  Other Topics Concern   Not on file  Social History Narrative   Not on file   Social Determinants of Health   Financial Resource Strain: Low Risk  (04/30/2023)   Overall Financial Resource Strain (CARDIA)    Difficulty of Paying Living Expenses: Not hard at all  Food Insecurity: No Food Insecurity (04/30/2023)   Hunger Vital Sign    Worried About Running Out of Food in the Last Year: Never true    Ran Out of Food in the Last Year: Never true  Transportation Needs: No Transportation Needs (04/30/2023)   PRAPARE - Administrator, Civil Service (Medical): No    Lack of Transportation (Non-Medical): No  Physical Activity: Sufficiently Active (04/30/2023)   Exercise Vital Sign    Days of Exercise per Week: 5 days    Minutes of Exercise per Session: 60 min  Stress: No Stress Concern Present (04/30/2023)   [employer] of Occupational Health - Occupational Stress Questionnaire    Feeling of Stress : Not at all  Social Connections: Moderately Isolated (04/30/2023)   Social Connection and Isolation Panel [NHANES]    Frequency of Communication with Friends and Family: Twice a week    Frequency of Social Gatherings with Friends and Family: Once a week    Attends Religious Services: Never    Database administrator or Organizations: No    Attends Engineer, structural: Never    Marital Status: Married     Family History: The patient's family history includes Breast cancer in her mother; CVA in her brother and brother; Crohn's disease in her brother; Diabetes in her brother; Hip fracture in her mother; Other in her brother; Renal cancer in her brother; Ulcers in her father.  ROS:   Please see the history of present illness.     EKGs/Labs/Other Studies Reviewed:    EKG:   today Atrial paced with PACs. LBBB. Rate 64. I have personally reviewed and interpreted this study.    Recent Labs: 03/26/2023: ALT 13; BUN 22; Creatinine, Ser 1.25; Hemoglobin 12.2;  Platelets 281.0; Potassium 4.7; Sodium 137   Recent Lipid Panel Lab Results  Component Value Date/Time   CHOL 210 (H) 03/26/2023 09:04 AM   TRIG 123.0 03/26/2023 09:04 AM   HDL 52.40 03/26/2023 09:04 AM   LDLCALC 133 (H) 03/26/2023 09:04 AM    Dated 06/03/20: cholesterol 159, triglycerides 94, HDL 59, LDL 83. Dated 06/15/21: A1c 6.3, Hgb 12.5. chemistries and TSH normal. Dated 11/15/21:A1c 6.3%   Echo 11/30/21: IMPRESSIONS     1. Left ventricular ejection fraction, by estimation, is 60 to 65%. Left  ventricular ejection fraction by 3D volume is 66 %. The left ventricle has  normal function. The left ventricle has  no regional wall motion  abnormalities. Left ventricular diastolic   parameters are consistent with Grade III diastolic dysfunction  (restrictive). Elevated left ventricular end-diastolic pressure.   2. Right ventricular systolic function is normal. The right ventricular  size is mildly enlarged. There is normal pulmonary artery systolic  pressure. The estimated right ventricular systolic pressure is 23.4 mmHg.   3. Left atrial size was moderately dilated.   4. Right atrial size was mildly dilated.   5. The mitral valve is normal in structure. No evidence of mitral valve  regurgitation. No evidence of mitral stenosis. Severe mitral annular  calcification.   6. The aortic valve is tricuspid. Aortic valve regurgitation is not  visualized. Aortic valve sclerosis/calcification is present, without any  evidence of aortic stenosis.   7. Aortic dilatation noted. There is borderline dilatation of the  ascending aorta, measuring 37 mm.   8. The inferior vena cava is normal in size with greater than 50%  respiratory variability, suggesting right atrial pressure of 3 mmHg.   Physical Exam:    VS:  BP (!) 150/74   Pulse 60   Ht 5\' 5"  (1.651 m)   Wt 165 lb (74.8 kg)   SpO2 98%   BMI 27.46 kg/m    No data found.  Wt Readings from Last 3 Encounters:  08/27/23 165 lb (74.8  kg)  05/02/23 168 lb (76.2 kg)  04/30/23 166 lb 3.2 oz (75.4 kg)     GEN:  Well nourished, well developed in no acute distress HEENT: Normal NECK: No JVD; No carotid bruits CARDIAC: RRR, no murmurs, rubs, gallops RESPIRATORY:  Clear to auscultation without rales, wheezing or rhonchi  ABDOMEN: Soft, non-tender, non-distended MUSCULOSKELETAL: No edema; No deformity  SKIN: Warm and dry NEUROLOGIC:  Alert and oriented PSYCHIATRIC:  Normal affect     ASSESSMENT AND PLAN    HTN - Essential. BP is high today but has been controlled at home. Previously had orthostasis on higher HCT dose.  -will continue losartan 100 mg daily,metoprolol  25 mg bid - HCT to 12.5 mg daily - Dash diet. - continue to monitor at home  2. Paroxysmal atrial fibrillation -Status post DCCV in November 2019 -Echo showed mild LV dysfunction with dyssynergy due to LBBB. Moderate LAE.  - more recent recurrence in Feb s/p successful DCCV.  - in A paced rhthym now. Pacer check shows no Afib.  -Continue Eliquis for stroke prevention and metoprolol for rate control.   - last Echo showed normal LV function  3. LBBB chronic.   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   5. HLD. Intolerant to statins.  No known history of vascular disease. Will focus on lifestyle modification for now.     Disposition - Follow-up in 6 months         Medication Adjustments/Labs and Tests Ordered: Current medicines are reviewed at length with the patient today.  Concerns regarding medicines are outlined above.  No orders of the defined types were placed in this encounter.  No orders of the defined types were placed in this encounter.   Patient Instructions  Medication Instructions:  Continue same medications *If you need a refill on your cardiac medications before your next appointment, please call your pharmacy*   Lab Work: None ordered   Testing/Procedures: None  ordered   Follow-Up: At [facility], you and your health needs are our priority.  As part of our continuing mission to provide  you with exceptional heart care, we have created designated Provider Care Teams.  These Care Teams include your primary Cardiologist (physician) and Advanced Practice Providers (APPs -  Physician Assistants and Nurse Practitioners) who all work together to provide you with the care you need, when you need it.  We recommend signing up for the patient portal called "MyChart".  Sign up information is provided on this After Visit Summary.  MyChart is used to connect with patients for Virtual Visits (Telemedicine).  Patients are able to view lab/test results, encounter notes, upcoming appointments, etc.  Non-urgent messages can be sent to your provider as well.   To learn more about what you can do with MyChart, go to [email or web address].    Your next appointment:  6 months    Call in March to schedule June appointment     Provider:  Dr.Sondos Wolfman     Signed, Cesilia Shinn [place], MD  08/27/2023 10:35 AM    [facility]

## 2023-08-26 ENCOUNTER — Ambulatory Visit
Admission: RE | Admit: 2023-08-26 | Discharge: 2023-08-26 | Disposition: A | Discharge status: Home / Self Care | Payer: Medicare Other | Source: Ambulatory Visit | Attending: Emergency Medicine

## 2023-08-26 DIAGNOSIS — M25561 Pain in right knee: Secondary | ICD-10-CM | POA: Diagnosis not present

## 2023-08-26 DIAGNOSIS — Z1231 Encounter for screening mammogram for malignant neoplasm of breast: Secondary | ICD-10-CM

## 2023-08-26 DIAGNOSIS — M1711 Unilateral primary osteoarthritis, right knee: Secondary | ICD-10-CM | POA: Diagnosis not present

## 2023-08-26 DIAGNOSIS — M23203 Derangement of unspecified medial meniscus due to old tear or injury, right knee: Secondary | ICD-10-CM | POA: Diagnosis not present

## 2023-08-27 ENCOUNTER — Ambulatory Visit: Payer: Medicare Other | Attending: Cardiology | Admitting: Cardiology

## 2023-08-27 ENCOUNTER — Encounter: Payer: Self-pay | Admitting: Cardiology

## 2023-08-27 VITALS — BP 150/74 | HR 60 | Ht 65.0 in | Wt 165.0 lb

## 2023-08-27 DIAGNOSIS — I442 Atrioventricular block, complete: Secondary | ICD-10-CM

## 2023-08-27 DIAGNOSIS — E7800 Pure hypercholesterolemia, unspecified: Secondary | ICD-10-CM

## 2023-08-27 DIAGNOSIS — I480 Paroxysmal atrial fibrillation: Secondary | ICD-10-CM

## 2023-08-27 DIAGNOSIS — I10 Essential (primary) hypertension: Secondary | ICD-10-CM

## 2023-08-27 MED ORDER — APIXABAN 5 MG PO TABS: 5.0000 mg | ORAL_TABLET | Freq: Two times a day (BID) | ORAL | Status: DC

## 2023-08-27 NOTE — Addendum Note (Signed)
Addended by: Neoma Laming on: 08/27/2023 06:06 PM   Modules accepted: Orders

## 2023-08-27 NOTE — Patient Instructions (Signed)
Medication Instructions:  Continue same medications *If you need a refill on your cardiac medications before your next appointment, please call your pharmacy*   Lab Work: None ordered   Testing/Procedures: None ordered   Follow-Up: At [facility], you and your health needs are our priority.  As part of our continuing mission to provide you with exceptional heart care, we have created designated Provider Care Teams.  These Care Teams include your primary Cardiologist (physician) and Advanced Practice Providers (APPs -  Physician Assistants and Nurse Practitioners) who all work together to provide you with the care you need, when you need it.  We recommend signing up for the patient portal called "MyChart".  Sign up information is provided on this After Visit Summary.  MyChart is used to connect with patients for Virtual Visits (Telemedicine).  Patients are able to view lab/test results, encounter notes, upcoming appointments, etc.  Non-urgent messages can be sent to your provider as well.   To learn more about what you can do with MyChart, go to [email or web address].    Your next appointment:  6 months    Call in March to schedule June appointment     Provider:  Dr.Jordan

## 2023-08-28 ENCOUNTER — Encounter: Payer: Self-pay | Admitting: Internal Medicine

## 2023-08-30 DIAGNOSIS — M1711 Unilateral primary osteoarthritis, right knee: Secondary | ICD-10-CM | POA: Diagnosis not present

## 2023-08-30 DIAGNOSIS — M23203 Derangement of unspecified medial meniscus due to old tear or injury, right knee: Secondary | ICD-10-CM | POA: Diagnosis not present

## 2023-09-03 ENCOUNTER — Ambulatory Visit (INDEPENDENT_AMBULATORY_CARE_PROVIDER_SITE_OTHER): Payer: Medicare Other

## 2023-09-03 DIAGNOSIS — M1711 Unilateral primary osteoarthritis, right knee: Secondary | ICD-10-CM | POA: Diagnosis not present

## 2023-09-03 DIAGNOSIS — S83231A Complex tear of medial meniscus, current injury, right knee, initial encounter: Secondary | ICD-10-CM | POA: Diagnosis not present

## 2023-09-03 DIAGNOSIS — M23203 Derangement of unspecified medial meniscus due to old tear or injury, right knee: Secondary | ICD-10-CM | POA: Diagnosis not present

## 2023-09-03 DIAGNOSIS — M84451A Pathological fracture, right femur, initial encounter for fracture: Secondary | ICD-10-CM | POA: Diagnosis not present

## 2023-09-03 DIAGNOSIS — M65961 Unspecified synovitis and tenosynovitis, right lower leg: Secondary | ICD-10-CM | POA: Diagnosis not present

## 2023-09-03 DIAGNOSIS — M84461A Pathological fracture, right tibia, initial encounter for fracture: Secondary | ICD-10-CM | POA: Diagnosis not present

## 2023-09-03 DIAGNOSIS — I442 Atrioventricular block, complete: Secondary | ICD-10-CM | POA: Diagnosis not present

## 2023-09-04 LAB — CUP PACEART REMOTE DEVICE CHECK
Battery Voltage: 90
Date Time Interrogation Session: 20241210080542
Implantable Lead Connection Status: 753985
Implantable Lead Connection Status: 753985
Implantable Lead Implant Date: 20230313
Implantable Lead Implant Date: 20230313
Implantable Lead Location: 753859
Implantable Lead Location: 753860
Implantable Lead Model: 377171
Implantable Lead Model: 377171
Implantable Lead Serial Number: 7000407809
Implantable Lead Serial Number: 8000780983
Implantable Pulse Generator Implant Date: 20230313
Pulse Gen Model: 407145
Pulse Gen Serial Number: 70364045

## 2023-09-06 DIAGNOSIS — M25561 Pain in right knee: Secondary | ICD-10-CM | POA: Diagnosis not present

## 2023-09-06 DIAGNOSIS — G8929 Other chronic pain: Secondary | ICD-10-CM | POA: Diagnosis not present

## 2023-09-06 DIAGNOSIS — Z7409 Other reduced mobility: Secondary | ICD-10-CM | POA: Diagnosis not present

## 2023-09-07 ENCOUNTER — Encounter (HOSPITAL_COMMUNITY): Payer: Self-pay

## 2023-09-07 ENCOUNTER — Observation Stay (HOSPITAL_COMMUNITY)
Admission: EM | Admit: 2023-09-07 | Discharge: 2023-09-09 | Disposition: A | Discharge status: Home / Self Care | Payer: Medicare Other | Attending: Internal Medicine | Admitting: Internal Medicine

## 2023-09-07 ENCOUNTER — Other Ambulatory Visit: Payer: Self-pay

## 2023-09-07 DIAGNOSIS — Z87891 Personal history of nicotine dependence: Secondary | ICD-10-CM | POA: Diagnosis not present

## 2023-09-07 DIAGNOSIS — K219 Gastro-esophageal reflux disease without esophagitis: Secondary | ICD-10-CM | POA: Diagnosis not present

## 2023-09-07 DIAGNOSIS — R778 Other specified abnormalities of plasma proteins: Secondary | ICD-10-CM | POA: Diagnosis not present

## 2023-09-07 DIAGNOSIS — I5032 Chronic diastolic (congestive) heart failure: Secondary | ICD-10-CM | POA: Diagnosis present

## 2023-09-07 DIAGNOSIS — R55 Syncope and collapse: Secondary | ICD-10-CM | POA: Diagnosis not present

## 2023-09-07 DIAGNOSIS — I480 Paroxysmal atrial fibrillation: Secondary | ICD-10-CM | POA: Diagnosis present

## 2023-09-07 DIAGNOSIS — N179 Acute kidney failure, unspecified: Secondary | ICD-10-CM | POA: Diagnosis not present

## 2023-09-07 DIAGNOSIS — R002 Palpitations: Principal | ICD-10-CM | POA: Diagnosis present

## 2023-09-07 DIAGNOSIS — I110 Hypertensive heart disease with heart failure: Secondary | ICD-10-CM | POA: Insufficient documentation

## 2023-09-07 DIAGNOSIS — Z7901 Long term (current) use of anticoagulants: Secondary | ICD-10-CM | POA: Diagnosis not present

## 2023-09-07 DIAGNOSIS — Z79899 Other long term (current) drug therapy: Secondary | ICD-10-CM | POA: Insufficient documentation

## 2023-09-07 DIAGNOSIS — R0789 Other chest pain: Principal | ICD-10-CM | POA: Diagnosis present

## 2023-09-07 DIAGNOSIS — D72829 Elevated white blood cell count, unspecified: Secondary | ICD-10-CM | POA: Diagnosis present

## 2023-09-07 DIAGNOSIS — R110 Nausea: Secondary | ICD-10-CM | POA: Diagnosis not present

## 2023-09-07 DIAGNOSIS — Z950 Presence of cardiac pacemaker: Secondary | ICD-10-CM | POA: Diagnosis not present

## 2023-09-07 DIAGNOSIS — R42 Dizziness and giddiness: Secondary | ICD-10-CM | POA: Insufficient documentation

## 2023-09-07 DIAGNOSIS — I10 Essential (primary) hypertension: Secondary | ICD-10-CM | POA: Diagnosis not present

## 2023-09-07 DIAGNOSIS — R7989 Other specified abnormal findings of blood chemistry: Secondary | ICD-10-CM | POA: Diagnosis present

## 2023-09-07 HISTORY — DX: Paroxysmal atrial fibrillation: I48.0

## 2023-09-07 LAB — TROPONIN I (HIGH SENSITIVITY)
Troponin I (High Sensitivity): 15 ng/L (ref <18)
Troponin I (High Sensitivity): 23 ng/L — ABNORMAL HIGH (ref <18)

## 2023-09-07 LAB — CBC
HCT: 34.3 % — ABNORMAL LOW (ref 36.0–46.0)
Hemoglobin: 11.5 g/dL — ABNORMAL LOW (ref 12.0–15.0)
MCH: 26.7 pg (ref 26.0–34.0)
MCHC: 33.5 g/dL (ref 30.0–36.0)
MCV: 79.6 fL — ABNORMAL LOW (ref 80.0–100.0)
Platelets: 301 10*3/uL (ref 150–400)
RBC: 4.31 MIL/uL (ref 3.87–5.11)
RDW: 15.1 % (ref 11.5–15.5)
WBC: 12.8 10*3/uL — ABNORMAL HIGH (ref 4.0–10.5)
nRBC: 0 % (ref 0.0–0.2)

## 2023-09-07 LAB — CBG MONITORING, ED: Glucose-Capillary: 99 mg/dL (ref 70–99)

## 2023-09-07 LAB — BASIC METABOLIC PANEL
Anion gap: 11 (ref 5–15)
BUN: 28 mg/dL — ABNORMAL HIGH (ref 8–23)
CO2: 22 mmol/L (ref 22–32)
Calcium: 9.7 mg/dL (ref 8.9–10.3)
Chloride: 102 mmol/L (ref 98–111)
Creatinine, Ser: 1.33 mg/dL — ABNORMAL HIGH (ref 0.44–1.00)
GFR, Estimated: 40 mL/min — ABNORMAL LOW (ref >60)
Glucose, Bld: 108 mg/dL — ABNORMAL HIGH (ref 70–99)
Potassium: 3.8 mmol/L (ref 3.5–5.1)
Sodium: 135 mmol/L (ref 135–145)

## 2023-09-07 LAB — URINALYSIS, ROUTINE W REFLEX MICROSCOPIC
Bacteria, UA: NONE SEEN
Bilirubin Urine: NEGATIVE
Glucose, UA: NEGATIVE mg/dL
Ketones, ur: NEGATIVE mg/dL
Leukocytes,Ua: NEGATIVE
Nitrite: NEGATIVE
Protein, ur: NEGATIVE mg/dL
Specific Gravity, Urine: 1.009 (ref 1.005–1.030)
pH: 6 (ref 5.0–8.0)

## 2023-09-07 NOTE — ED Notes (Addendum)
PACEMAKER INTERROGATION HAS BEEN COMPLETED

## 2023-09-07 NOTE — ED Provider Triage Note (Signed)
Emergency Medicine Provider Triage Evaluation Note  Lisa [name] , a 82 y.o. female  was evaluated in triage.  Transported by EMS.  Pt complains of palpitations, lightheadedness with standing and nausea.  She has a history of complete heart block, Biotronik pacemaker implantation.  She reports sitting in a chair around 2 PM watching football when she developed sensation that her heart was racing.  She stood up and nearly passed out.  No associated chest pain.  This resolved over an hour or 2 but then returned around dinnertime.  No vomiting.  Review of Systems  Positive: Palpitations Negative: Chest pain  Physical Exam  BP (!) 184/77 (BP Location: Right Arm)   Pulse 64   Temp 98.1 F (36.7 C) (Oral)   Resp 16   Ht 5\' 5"  (1.651 m)   Wt 74.8 kg   SpO2 96%   BMI 27.46 kg/m  Gen:   Awake, no distress   Resp:  Normal effort  MSK:   Moves extremities without difficulty  Other:  Heart rate about 60  Medical Decision Making  Medically screening exam initiated at 7:22 PM.  Appropriate orders placed.  NYLAN OESTREICH was informed that the remainder of the evaluation will be completed by another provider, this initial triage assessment does not replace that evaluation, and the importance of remaining in the ED until their evaluation is complete.     Renne Crigler, PA-C 09/07/23 [id number]

## 2023-09-07 NOTE — ED Triage Notes (Signed)
PER EMS: pt is from home with c/o intermittent dizziness and nausea that started today around 1400 when she stood up. Denies LOC or fall.   BP-180/80, HR-60s paced rhythm, O2-100% RA, CBG-102

## 2023-09-07 NOTE — ED Notes (Signed)
Pt has a BIOTRONIK pacemaker and this RN has called the company to have them send a representative to interrogate her pacemaker.

## 2023-09-08 ENCOUNTER — Emergency Department (HOSPITAL_COMMUNITY): Payer: Medicare Other

## 2023-09-08 ENCOUNTER — Encounter (HOSPITAL_COMMUNITY): Payer: Self-pay | Admitting: Emergency Medicine

## 2023-09-08 DIAGNOSIS — I480 Paroxysmal atrial fibrillation: Secondary | ICD-10-CM | POA: Diagnosis not present

## 2023-09-08 DIAGNOSIS — I5032 Chronic diastolic (congestive) heart failure: Secondary | ICD-10-CM | POA: Diagnosis present

## 2023-09-08 DIAGNOSIS — I517 Cardiomegaly: Secondary | ICD-10-CM | POA: Diagnosis not present

## 2023-09-08 DIAGNOSIS — R7989 Other specified abnormal findings of blood chemistry: Secondary | ICD-10-CM | POA: Diagnosis present

## 2023-09-08 DIAGNOSIS — R002 Palpitations: Secondary | ICD-10-CM | POA: Diagnosis not present

## 2023-09-08 DIAGNOSIS — R0789 Other chest pain: Secondary | ICD-10-CM | POA: Diagnosis present

## 2023-09-08 DIAGNOSIS — I10 Essential (primary) hypertension: Secondary | ICD-10-CM

## 2023-09-08 DIAGNOSIS — Z950 Presence of cardiac pacemaker: Secondary | ICD-10-CM | POA: Diagnosis not present

## 2023-09-08 DIAGNOSIS — N179 Acute kidney failure, unspecified: Secondary | ICD-10-CM | POA: Diagnosis not present

## 2023-09-08 DIAGNOSIS — R778 Other specified abnormalities of plasma proteins: Secondary | ICD-10-CM | POA: Diagnosis not present

## 2023-09-08 DIAGNOSIS — D72829 Elevated white blood cell count, unspecified: Secondary | ICD-10-CM | POA: Diagnosis not present

## 2023-09-08 DIAGNOSIS — K219 Gastro-esophageal reflux disease without esophagitis: Secondary | ICD-10-CM

## 2023-09-08 LAB — CBC WITH DIFFERENTIAL/PLATELET
Abs Immature Granulocytes: 0.06 10*3/uL (ref 0.00–0.07)
Basophils Absolute: 0.1 10*3/uL (ref 0.0–0.1)
Basophils Relative: 0 %
Eosinophils Absolute: 0.1 10*3/uL (ref 0.0–0.5)
Eosinophils Relative: 1 %
HCT: 38.8 % (ref 36.0–46.0)
Hemoglobin: 12.8 g/dL (ref 12.0–15.0)
Immature Granulocytes: 1 %
Lymphocytes Relative: 27 %
Lymphs Abs: 3.2 10*3/uL (ref 0.7–4.0)
MCH: 26.4 pg (ref 26.0–34.0)
MCHC: 33 g/dL (ref 30.0–36.0)
MCV: 80 fL (ref 80.0–100.0)
Monocytes Absolute: 0.8 10*3/uL (ref 0.1–1.0)
Monocytes Relative: 7 %
Neutro Abs: 7.4 10*3/uL (ref 1.7–7.7)
Neutrophils Relative %: 64 %
Platelets: 322 10*3/uL (ref 150–400)
RBC: 4.85 MIL/uL (ref 3.87–5.11)
RDW: 15.4 % (ref 11.5–15.5)
WBC: 11.6 10*3/uL — ABNORMAL HIGH (ref 4.0–10.5)
nRBC: 0 % (ref 0.0–0.2)

## 2023-09-08 LAB — PROCALCITONIN: Procalcitonin: <0.1 ng/mL

## 2023-09-08 LAB — COMPREHENSIVE METABOLIC PANEL
ALT: 14 U/L (ref 0–44)
AST: 21 U/L (ref 15–41)
Albumin: 3.7 g/dL (ref 3.5–5.0)
Alkaline Phosphatase: 61 U/L (ref 38–126)
Anion gap: 9 (ref 5–15)
BUN: 22 mg/dL (ref 8–23)
CO2: 23 mmol/L (ref 22–32)
Calcium: 10.2 mg/dL (ref 8.9–10.3)
Chloride: 106 mmol/L (ref 98–111)
Creatinine, Ser: 1.23 mg/dL — ABNORMAL HIGH (ref 0.44–1.00)
GFR, Estimated: 44 mL/min — ABNORMAL LOW (ref >60)
Glucose, Bld: 98 mg/dL (ref 70–99)
Potassium: 4.7 mmol/L (ref 3.5–5.1)
Sodium: 138 mmol/L (ref 135–145)
Total Bilirubin: 0.8 mg/dL (ref <1.2)
Total Protein: 6.9 g/dL (ref 6.5–8.1)

## 2023-09-08 LAB — TSH: TSH: 2.201 u[IU]/mL (ref 0.350–4.500)

## 2023-09-08 LAB — TROPONIN I (HIGH SENSITIVITY): Troponin I (High Sensitivity): 32 ng/L — ABNORMAL HIGH (ref <18)

## 2023-09-08 LAB — LIPASE, BLOOD: Lipase: 38 U/L (ref 11–51)

## 2023-09-08 LAB — MAGNESIUM: Magnesium: 1.7 mg/dL (ref 1.7–2.4)

## 2023-09-08 LAB — BRAIN NATRIURETIC PEPTIDE: B Natriuretic Peptide: 157.3 pg/mL — ABNORMAL HIGH (ref 0.0–100.0)

## 2023-09-08 MED ORDER — POTASSIUM CHLORIDE CRYS ER 20 MEQ PO TBCR
40.0000 meq | EXTENDED_RELEASE_TABLET | Freq: Once | ORAL | Status: AC
  Administered 2023-09-08: 40 meq via ORAL
  Filled 2023-09-08: qty 2

## 2023-09-08 MED ORDER — APIXABAN 5 MG PO TABS
5.0000 mg | ORAL_TABLET | Freq: Two times a day (BID) | ORAL | Status: DC
  Administered 2023-09-08 – 2023-09-09 (×3): 5 mg via ORAL
  Filled 2023-09-08 (×3): qty 1

## 2023-09-08 MED ORDER — MELATONIN 3 MG PO TABS: 3.0000 mg | ORAL_TABLET | Freq: Every evening | ORAL | Status: DC | PRN

## 2023-09-08 MED ORDER — HYDROCHLOROTHIAZIDE 12.5 MG PO TABS: 12.5000 mg | ORAL_TABLET | Freq: Every day | ORAL | Status: DC

## 2023-09-08 MED ORDER — METOPROLOL TARTRATE 50 MG PO TABS
50.0000 mg | ORAL_TABLET | Freq: Two times a day (BID) | ORAL | Status: DC
Start: 2023-09-08 — End: 2023-09-09
  Administered 2023-09-08 – 2023-09-09 (×3): 50 mg via ORAL
  Filled 2023-09-08 (×3): qty 1

## 2023-09-08 MED ORDER — ONDANSETRON HCL 4 MG/2ML IJ SOLN: 4.0000 mg | Freq: Four times a day (QID) | INTRAMUSCULAR | Status: DC | PRN

## 2023-09-08 MED ORDER — PANTOPRAZOLE SODIUM 40 MG PO TBEC
40.0000 mg | DELAYED_RELEASE_TABLET | Freq: Every day | ORAL | Status: DC
  Administered 2023-09-08 – 2023-09-09 (×2): 40 mg via ORAL
  Filled 2023-09-08 (×2): qty 1

## 2023-09-08 MED ORDER — ACETAMINOPHEN 650 MG RE SUPP: 650.0000 mg | Freq: Four times a day (QID) | RECTAL | Status: DC | PRN

## 2023-09-08 MED ORDER — ACETAMINOPHEN 325 MG PO TABS: 650.0000 mg | ORAL_TABLET | Freq: Four times a day (QID) | ORAL | Status: DC | PRN

## 2023-09-08 MED ORDER — LOSARTAN POTASSIUM 50 MG PO TABS: 100.0000 mg | ORAL_TABLET | Freq: Every day | ORAL | Status: DC

## 2023-09-08 MED ORDER — PANTOPRAZOLE SODIUM 40 MG IV SOLR
40.0000 mg | INTRAVENOUS | Status: DC
  Administered 2023-09-08: 40 mg via INTRAVENOUS
  Filled 2023-09-08: qty 10

## 2023-09-08 NOTE — Assessment & Plan Note (Signed)
09-08-2023 likely due to rapid afib.

## 2023-09-08 NOTE — Assessment & Plan Note (Signed)
09-08-2023 have to stop losartan and hydrochlorothiazide due to AKI. Up titration of lopressor for better HR control.  09-09-2023 HR under control with lopressor 50 mg bid. Will add hydralazine 25 mg tid. Pt has reported allergy to norvasc that causes edema.

## 2023-09-08 NOTE — Subjective & Objective (Signed)
Pt seen and examined. HR much improved on lopressor 50 mg bid. Cardiology consulted per her request. Pt has lots of questions BP still up but she is still complaining about her right knee pain. Scr down to 1.23. BUN down to 22. Pt off losartan and hydrochlorothiazide.  Her telmetry reviewed. She is partially paced with HR right around 60 bpm.  Echo tech came to bedside to perform echo.  I think pt is stable for DC. Awaiting cardiology consult that pt requested.

## 2023-09-08 NOTE — Assessment & Plan Note (Signed)
09-08-2023 doubt infection. Likely due to stress from palpitations.

## 2023-09-08 NOTE — Assessment & Plan Note (Signed)
09-08-2023 was in rapid afib this AM. Will increase lopressor to 50 mg bid. Verified that her PPM set for lowest HR of 60 bpm. Continue with eliquis. Hold losartan and hydrochlorothiazide to give BP more room for up titration of lopressor.  09-09-2023 HR improved on lopressor 50 mg bid. On Eliquis. Cardiology consulted per her and husband request. Echo shows LVEF 50%.

## 2023-09-08 NOTE — Assessment & Plan Note (Signed)
09-08-2023 euvolemic/mild hypovolemic. Hold off on IVF for now.

## 2023-09-08 NOTE — H&P (Signed)
History and Physical      Lisa [name] ZOX:[id number] DOB: [birth year] DOA: 09/07/2023; DOS: 09/08/2023  PCP: Georgina Quint, MD  Patient coming from: home   I have personally briefly reviewed patient's old medical records in [facility]  Chief Complaint: chest pain  HPI: Lisa [name] is a 82 y.o. 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   Over the last day, the patient has experienced 2 episodes of palpitations followed by development of sharp nonradiating left-sided chest discomfort associated with shortness of breath, dizziness, without ensuing loss of consciousness without fall.  These episodes occur while at rest, and have both resolved spontaneously within 1 to 2 minutes of onset, responding with resolution of palpitations, in the absence of any interval nitroglycerin.  She denies any residual or new chest pain in between these episodes of palpitations.  Denies any recent subjective fever, chills or rigors, generalized myalgias.  Recent cough or trauma.  She has a history of paroxysmal atrial fibrillation, accompanied by sick sinus syndrome, complete heart block, for which she underwent pacemaker placement on 12/04/2021 in the [facility] health system.  She follows with Dr. Swazil[place] as her outpatient cardiologist, and notes good compliance with chronic anticoagulation on home Eliquis.  In terms of outpatient AV nodal blocking regimen, she is on Lopressor 25 mg p.o. twice daily.  Most recent echocardiogram occurred in March 2023 and was notable for LVEF 66 5%, no evidence of focal motion  abnormalities, grade 3 diastolic dysfunction with normal right ventricular systolic function, moderately dilated left atrium, mildly dilated right atrium and no evidence of significant valvular pathology.  She confirms that she is currently chest pain-free, and that she took a full dose aspirin x 1 at home prior to presenting to the emergency department this evening for    ED Course:  Vital signs in the ED were notable for the following: Afebrile; heart rates in the 60s to 70s; systolic blood pressure in the 160s 20s; respiratory rate 16-18, oxygen saturation 97 today 100% on room air.  Labs were notable for the following: BMP, which was notable for sodium 135 potassium 3.8, creatinine 1.33 compared to 1.25 on 7-24.  High-sensitivity troponin I initially 15, with repeat value trending up at 23.  CBC notable for with a cell count 12,800, hemoglobin 11.5 compared to 12.2 on 7-24, platelet count 301.  Urinalysis notable for noted blood cells, leukocyte esterase/nitrate negative.  Per my interpretation, EKG in ED demonstrated the following: Atrial paced rhythm, noting the presence of pacer spikes, left bundle branch block, heart rate 61 no evidence of T wave or ST changes, including no evidence of ST elevation.  Imaging in the ED, per corresponding formal radiology read, was notable for the following: 1 view chest x-ray shows no evidence of acute cardiopulmonary process, including no evidence of infiltrate, edema, effusion, or pneumothorax.  While in the ED, the following were administered: None.  Subsequently, the patient was admitted further evaluation management of atypical chest pain.     Review of Systems: As per HPI otherwise 10 point review of systems negative.   Past Medical History:  Diagnosis Date   Bundle branch block, left  Dyslipidemia    GERD (gastroesophageal reflux disease)    Hypercholesteremia    Hypertension    LBBB (left bundle branch block)    UTI (urinary tract  infection)     Past Surgical History:  Procedure Laterality Date   bladder polyp removal     BREAST EXCISIONAL BIOPSY Right    60 yrs ago- Benign   BREAST LUMPECTOMY Left    No Visible Scar, said 30 + years ago in situ, no chemo, no radiation, or hormone replacement     CARDIOVERSION N/A 07/30/2018   Procedure: CARDIOVERSION;  Surgeon: Jake Bathe, MD;  Location: [facility] ENDOSCOPY;  Service: Cardiovascular;  Laterality: N/A;   CARDIOVERSION N/A 11/28/2021   Procedure: CARDIOVERSION;  Surgeon: Marinus Maw, MD;  Location: [facility] INVASIVE CV LAB;  Service: Cardiovascular;  Laterality: N/A;   CESAREAN SECTION     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   Procedure: PACEMAKER IMPLANT;  Surgeon: Marinus Maw, MD;  Location: [facility] INVASIVE CV LAB;  Service: Cardiovascular;  Laterality: N/A;    Social History:  reports that she quit smoking about 31 years ago. Her smoking use included cigarettes. She started smoking about 51 years ago. She has never used smokeless tobacco. She reports current alcohol use. She reports that she does not use drugs.   Allergies  Allergen Reactions   Codeine Other (See Comments) and Nausea And Vomiting    GI upset Other reaction(s): stomach upset   Amlodipine Swelling     edema   Fosamax [Alendronate Sodium] Other (See Comments)    Tooth problems   Lisinopril Cough   Olmesartan Other (See Comments)    Possible photodermatitis     Family History  Problem Relation Age of Onset   Breast cancer Mother    Hip fracture Mother    Ulcers Father    CVA Brother    Renal cancer Brother    Other Brother        hip replacement   CVA Brother        Had PaceMaker   Crohn's disease Brother    Diabetes Brother     Family history reviewed and not pertinent    Prior to Admission medications   Medication Sig Start Date End Date Taking? Authorizing Provider  apixaban (ELIQUIS) 5 MG TABS tablet TAKE 1 TABLET BY MOUTH TWICE DAILY 04/12/23   [place], Peter M, MD  apixaban  (ELIQUIS) 5 MG TABS tablet Take 1 tablet (5 mg total) by mouth 2 (two) times daily. 08/27/23   [place], Peter M, MD  Cholecalciferol (VITAMIN D3) 50 MCG (2000 UT) TABS Take 2,000 Units by mouth daily.    [provider]  hydrochlorothiazide (HYDRODIURIL) 12.5 MG tablet Take 1 tablet (12.5 mg total) by mouth daily. 04/17/23   [place], Peter M, MD  losartan (COZAAR) 100 MG tablet Take 1 tablet (100 mg total) by mouth daily. 10/31/22   [place], Peter M, MD  metoprolol tartrate (LOPRESSOR) 25 MG tablet TAKE 1 TABLET BY MOUTH TWICE DAILY. GENERIC EQUIVALENT FOR LOPRESSOR 01/01/23   [place], Peter M, MD  pantoprazole (PROTONIX) 20 MG tablet Take 20 mg by mouth daily as needed (gerd/heartburn). 07/04/18   [provider]     Objective    Physical Exam: Vitals:   09/08/23 0100 09/08/23 0123 09/08/23 0158 09/08/23 0200  BP: (!) 185/71 (!) 195/76  (!) 175/60  Pulse: 61 65  65  Resp: 12 17  16   Temp:   98.3 F (36.8 C)  TempSrc:   Oral   SpO2: 100% 98%  93%  Weight:      Height:        General: appears to be stated age; alert, oriented Skin: warm, dry, no rash Head:  AT/[place] Mouth:  Oral mucosa membranes appear moist, normal dentition Neck: supple; trachea midline Chest: pacemaker noted. Heart:  RRR; did not appreciate any M/R/G Lungs: CTAB, did not appreciate any wheezes, rales, or rhonchi Abdomen: + BS; soft, ND, NT Vascular: 2+ pedal pulses b/l; 2+ radial pulses b/l Extremities: no peripheral edema, no muscle wasting Neuro: strength and sensation intact in upper and lower extremities b/l    Labs on Admission: I have personally reviewed following labs and imaging studies  CBC: Recent Labs  Lab 09/07/23 1924  WBC 12.8*  HGB 11.5*  HCT 34.3*  MCV 79.6*  PLT 301   Basic Metabolic Panel: Recent Labs  Lab 09/07/23 1924  NA 135  K 3.8  CL 102  CO2 22  GLUCOSE 108*  BUN 28*  CREATININE 1.33*  CALCIUM 9.7   GFR: Estimated Creatinine Clearance: 33.6 mL/min (A)  (by C-G formula based on SCr of 1.33 mg/dL (H)). Liver Function Tests: No results for input(s): "AST", "ALT", "ALKPHOS", "BILITOT", "PROT", "ALBUMIN" in the last 168 hours. No results for input(s): "LIPASE", "AMYLASE" in the last 168 hours. No results for input(s): "AMMONIA" in the last 168 hours. Coagulation Profile: No results for input(s): "INR", "PROTIME" in the last 168 hours. Cardiac Enzymes: No results for input(s): "CKTOTAL", "CKMB", "CKMBINDEX", "TROPONINI" in the last 168 hours. BNP (last 3 results) No results for input(s): "PROBNP" in the last 8760 hours. HbA1C: No results for input(s): "HGBA1C" in the last 72 hours. CBG: Recent Labs  Lab 09/07/23 1924  GLUCAP 99   Lipid Profile: No results for input(s): "CHOL", "HDL", "LDLCALC", "TRIG", "CHOLHDL", "LDLDIRECT" in the last 72 hours. Thyroid Function Tests: No results for input(s): "TSH", "T4TOTAL", "FREET4", "T3FREE", "THYROIDAB" in the last 72 hours. Anemia Panel: No results for input(s): "VITAMINB12", "FOLATE", "FERRITIN", "TIBC", "IRON", "RETICCTPCT" in the last 72 hours. Urine analysis:    Component Value Date/Time   COLORURINE STRAW (A) 09/07/2023 2134   APPEARANCEUR CLEAR 09/07/2023 2134   LABSPEC 1.009 09/07/2023 2134   PHURINE 6.0 09/07/2023 2134   GLUCOSEU NEGATIVE 09/07/2023 2134   GLUCOSEU NEGATIVE 05/02/2023 1540   HGBUR SMALL (A) 09/07/2023 2134   BILIRUBINUR NEGATIVE 09/07/2023 2134   BILIRUBINUR negative 05/02/2023 1523   KETONESUR NEGATIVE 09/07/2023 2134   PROTEINUR NEGATIVE 09/07/2023 2134   UROBILINOGEN 0.2 05/02/2023 1540   UROBILINOGEN 0.2 05/02/2023 1523   NITRITE NEGATIVE 09/07/2023 2134   LEUKOCYTESUR NEGATIVE 09/07/2023 2134    Radiological Exams on Admission: No results found.    Assessment/Plan   Principal Problem:   Atypical chest pain Active Problems:   GERD (gastroesophageal reflux disease)   Paroxysmal atrial fibrillation (HCC)   Essential hypertension   Palpitations    Elevated troponin   Leukocytosis   Chronic diastolic CHF (congestive heart failure) (HCC)    #) Atypical Chest Pain: 2 episodes of nonradiating nonexertional left-sided chest pain that started with palpitations, with spontaneous resolution corresponding with resolution of the palpitations, and in the absence of any nitroglycerin.  Currently chest pain-free.  Given this history, and and in particular the sequence of symptoms starting with palpitations followed by chest discomfort, and in the context of a history of paroxysmal atrial fibrillation, suspect contributory underlying tachyarrhythmia as the driving source of these intermittent transient episodes  of chest discomfort, as opposed to underlying ACS.  Pacer to be interrogated to further assess.  Troponin shows minimal elevation, with initial value of 15 followed by 23, with his small elevation felt to be most likely the basis of type II supply/demand mismatch as a consequence of suspected tachyarrhythmia.  Presenting EKG shows atrial paced rhythm, including the presence of pacer spikes, without overt evidence of acute ischemic changes, including no evidence of STEMI, while chest x-ray shows no evidence of acute cardiopulmonary process, including no evidence of pneumothorax.   Presentation associated with heart score of 4 conveying a moderate risk of major acute cardiac event over the ensuing 6 weeks.  Consequently, the patient is being admitted for further evaluation management of her chest pain, including further trending of troponin and pursuit of echocardiogram in the morning.  Given suspected underlying tachyarrhythmia leading to her chest discomfort, and while noting the presence of pacemaker with pacer spikes on EKG, will increase the dose of her Lopressor from 25 mg p.o. to twice daily to 50 mg p.o. twice daily.   Of note, she has not received a full dose aspirin at home earlier today.  Presentation appears less suggestive of acute  pulmonary embolism, particularly given her excellent compliance with chronic anticoagulation on Eliquis.  She also has a history of GERD, for which she is on a PPI on a as needed basis only as an outpatient.  Plan: trend serial troponin. Monitor on telemetry. PRN sublingual nitroglycerin. PRN EKG for subsequent episodes of chest pain.  Potassium chloride 40 mill colons p.o. x 1 dose now.  Check serum Mg level and repeat BMP in the morning. Repeat CBC in the AM.  Increase home metoprolol to tartrate from 25 mg p.o. twice daily to 50 mg p.o. daily, as described above.  Resume home losartan.  Echocardiogram ordered for the morning.  Check liver enzymes, lipase.  Protonix 40 mg IV daily.  Pacemaker interrogation, as above.  Add on TSH.  Fall precautions ordered.                  #) Leukocytosis: Presenting CBC reflects mildly elevated white cell count of 12,800. This may also be reactive in nature in the setting of presenting episodes of palpitations, chest pain, with concern for underlying tachyarrhythmia.   no evidence to suggest underlying infectious process at this time, including  Urinalysis that was inconsistent with UTI, chest x-ray shows no evidence of acute cardiopulmonary process.  No additional SIRS criteria met at this time. Overall, criteria for sepsis are not currently met. Appears hemodynamically stable.  Therefore, will refrain from initiation of antibiotics at this time.  Plan: Repeat CBC with diff in the morning.  Monitor strict I's and O's, daily weights.  Add on procalcitonin level.                #) Paroxysmal atrial fibrillation: Documented history of such, complicated by sick sinus syndrome, complete heart block status post pacemaker placement in March 2023. In setting of CHA2DS2-VASc score of 5, there is an indication for chronic anticoagulation for thromboembolic prophylaxis. Consistent with this, patient is chronically anticoagulated on Eliquis. Home AV  nodal blocking regimen: Metoprolol to tartrate 25 mg p.o. twice daily.  Most recent echocardiogram was performed in March 2023, and notable for LVEF 66 5%, moderately dilated left atrium, appears greater than 2 additional episodes of atrial fibrillation, as well as mildly dilated right atrium in the absence of any significant valvular pathology. Presenting EKG shows  atrial paced rhythm without overt evidence of acute ischemic changes.   Plan: monitor strict I's & O's and daily weights. CMP/CBC in AM. Check serum mag level.  Increase home dose of metoprolol tartrate from 25 mg p.o. twice daily to 50 mg daily, as described above.  Continue outpatient Eliquis.  Monitor on telemetry.  Pacemaker interrogation, as above.                    #) Chronic diastolic heart failure: documented history of such, with most recent echocardiogram performed in March 2023, with results notable for LVEF 66%, grade 3 diastolic dysfunction, with additional results as noted above. No clinical or radiographic evidence to suggest acutely decompensated heart failure at this time. home diuretic regimen reportedly consists of the following: None.   Plan: monitor strict I's & O's and daily weights. Repeat CMP in AM. Check serum mag level.  Add on BNP.                #) GERD: documented h/o such; on prn PPI as outpatient.  In the setting of her presenting atypical chest discomfort, will felt to most likely be representative of a tachyarrhythmia, we will proceed with scheduling of her PPI for now.  Plan: Protonix 40 mg IV daily, as above.                #) Essential Hypertension: documented h/o such, with outpatient antihypertensive regimen including losartan, HCTZ, metoprolol tartrate.  SBP's in the ED today: 160s to 180s mmHg. as noted above, plan to increase dose of beta-blocker given concern for tachyarrhythmia, and will closely monitor ensuing blood pressure response to this  measure.  Plan: Close monitoring of subsequent BP via routine VS. increase metoprolol tartrate from 25 mg p.o. twice daily to 50 mg p.o. daily, as above, resume home losartan and HCTZ.  Monitor on telemetry.     DVT prophylaxis: SCD's + continue home Eliquis Code Status: Full code Family Communication: none Disposition Plan: Per Rounding Team Consults called: none;  Admission status: observation     I SPENT GREATER THAN 75  MINUTES IN CLINICAL CARE TIME/MEDICAL DECISION-MAKING IN COMPLETING THIS ADMISSION.      Chaney Born Latara Micheli DO [facility]  From 7PM - 7AM   09/08/2023, 2:38 AM

## 2023-09-08 NOTE — Plan of Care (Signed)

## 2023-09-08 NOTE — Progress Notes (Signed)
PROGRESS NOTE    Lisa [name]  ZOX:[id number] DOB: [birth year] DOA: 09/07/2023 PCP: Georgina Quint, MD  Subjective: Pt seen and examined. Had palpitations yesterday. Her pacemaker interrogated but histogram of her HR yesterday does not show a spike in HR >100. Pt was in rapid afib this AM on telemetry with HR consistently >120.  By the time EKG could be obtained, pt's HR around 101. EKG does show afib.  Lopressor increased to 50 mg bid.   Hospital Course: HPI: Lisa [name] is a 82 y.o. 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    Over the last day, the patient has experienced 2 episodes of palpitations followed by development of sharp nonradiating left-sided chest discomfort associated with shortness of breath, dizziness, without ensuing loss of consciousness without fall.  These episodes occur while at rest, and have both resolved spontaneously within 1 to 2 minutes of onset, responding with resolution of palpitations, in the absence of any interval nitroglycerin.  She denies any residual or new chest pain in between these episodes of palpitations.  Denies any recent subjective fever, chills or rigors, generalized myalgias.  Recent cough or trauma.   She has a history of paroxysmal atrial fibrillation, accompanied by sick sinus syndrome, complete heart block, for which she underwent pacemaker placement on 12/04/2021 in the [facility] health system.  She follows with Dr. Swaziland as her outpatient cardiologist, and notes good compliance with chronic anticoagulation on home Eliquis.  In terms of  outpatient AV nodal blocking regimen, she is on Lopressor 25 mg p.o. twice daily.   Most recent echocardiogram occurred in March 2023 and was notable for LVEF 66 5%, no evidence of focal motion abnormalities, grade 3 diastolic dysfunction with normal right ventricular systolic function, moderately dilated left atrium, mildly dilated right atrium and no evidence of significant valvular pathology.   She confirms that she is currently chest pain-free, and that she took a full dose aspirin x 1 at home prior to presenting to the emergency department this evening for       ED Course:  Vital signs in the ED were notable for the following: Afebrile; heart rates in the 60s to 70s; systolic blood pressure in the 160s 20s; respiratory rate 16-18, oxygen saturation 97 today 100% on room air.   Labs were notable for the following: BMP, which was notable for sodium 135 potassium 3.8, creatinine 1.33 compared to 1.25 on 7-24.  High-sensitivity troponin I initially 15, with repeat value trending up at 23.  CBC notable for with a cell count 12,800, hemoglobin 11.5 compared to 12.2 on 7-24, platelet count 301.  Urinalysis notable for noted blood cells, leukocyte esterase/nitrate negative.   Per my interpretation, EKG in ED demonstrated the following: Atrial paced rhythm, noting the presence of pacer spikes, left bundle branch block, heart rate 61 no evidence of T wave or ST changes, including no evidence of ST elevation.   Imaging in the ED, per corresponding formal radiology read, was notable for the following: 1 view chest x-ray shows no evidence of acute cardiopulmonary process, including no evidence of infiltrate, edema, effusion, or pneumothorax.   While in the ED, the following were administered:  None.   Subsequently, the patient was admitted further evaluation management of atypical chest pain.   Significant Events: Admitted 09/07/2023 for atypical CP   Significant Labs: Admission sodium 135 potassium  3.8, creatinine 1.33 compared to 1.25 on 7-24. High-sensitivity troponin I initially 15, with repeat value trending up at 23. CBC notable for with a cell count 12,800, hemoglobin 11.5 compared to 12.2 on 7-24, platelet count 301. Urinalysis notable for noted blood cells, leukocyte esterase/nitrate negative.   Significant Imaging Studies: Admission chest x-ray shows no evidence of acute cardiopulmonary process, including no evidence of infiltrate, edema, effusion, or pneumothorax   Antibiotic Therapy: Anti-infectives (From admission, onward)    None       Procedures:   Consultants:     Assessment and Plan: * Atypical chest pain 09-08-2023 unclear the cause of her CP. I suspect it was from rapid afib. Will have cards look at her pacemaker interrogation. She may need another pacer interrogation to verify her rapid afib this AM.  Paroxysmal atrial fibrillation (HCC) 09-08-2023 was in rapid afib this AM. Will increase lopressor to 50 mg bid. Verified that her PPM set for lowest HR of 60 bpm. Continue with eliquis. Hold losartan and hydrochlorothiazide to give BP more room for up titration of lopressor.  AKI (acute kidney injury) (HCC) 09-08-2023 mild AKI with Scr 1.33. have stopped losartan and hydrochlorothiazide for now. Repeat BMP in AM.  Elevated troponin I level 09-08-2023 not ACS. Maybe tachycardia related. Trending troponin.  Leukocytosis 09-08-2023 doubt infection. Likely due to stress from palpitations.  Elevated troponin 09-08-2023 likely due to rapid afib.  Palpitations 09-08-2023 likely her rapid afib from yesterday.  Chronic diastolic CHF (congestive heart failure) (HCC) 09-08-2023 euvolemic/mild hypovolemic. Hold off on IVF for now.  Essential hypertension 09-08-2023 have to stop losartan and hydrochlorothiazide due to AKI. Up titration of lopressor for better HR control.  GERD (gastroesophageal reflux disease) 09-08-2023 continue protonix. Change to  po.       DVT prophylaxis: SCDs Start: 09/08/23 0233 apixaban (ELIQUIS) tablet 5 mg     Code Status: Full Code Family Communication: discussed with pt and husband at bedside Disposition Plan: return home Reason for continuing need for hospitalization: monitor HR with afib  Objective: Vitals:   09/08/23 0230 09/08/23 0300 09/08/23 0356 09/08/23 0735  BP: (!) 161/71 (!) 149/65 136/77 (!) 156/76  Pulse: 66 67  (!) 107  Resp: 15 16  16   Temp:   98.2 F (36.8 C) 98.4 F (36.9 C)  TempSrc:   Oral Oral  SpO2: 93% 93%  97%  Weight:   71.5 kg   Height:        Intake/Output Summary (Last 24 hours) at 09/08/2023 1005 Last data filed at 09/08/2023 0600 Gross per 24 hour  Intake 240 ml  Output --  Net 240 ml   Filed Weights   09/07/23 1913 09/08/23 0356  Weight: 74.8 kg 71.5 kg    Examination:  Physical Exam Vitals and nursing note reviewed.  Constitutional:      General: She is not in acute distress.    Appearance: Normal appearance. She is not toxic-appearing or diaphoretic.  HENT:     Head: Normocephalic and atraumatic.     Nose: Nose normal.  Cardiovascular:     Rate and Rhythm: Tachycardia present. Rhythm irregular.  Pulmonary:     Effort: Pulmonary effort is normal. No respiratory distress.     Breath sounds: Normal breath sounds.  Abdominal:     General:  Abdomen is flat. Bowel sounds are normal.     Palpations: Abdomen is soft.  Musculoskeletal:     Right lower leg: No edema.     Left lower leg: No edema.  Skin:    General: Skin is warm and dry.     Capillary Refill: Capillary refill takes less than 2 seconds.  Neurological:     General: No focal deficit present.     Mental Status: She is alert and oriented to person, place, and time.     Data Reviewed: I have personally reviewed following labs and imaging studies  EKG: afib with RVR   CBC: Recent Labs  Lab 09/07/23 1924  WBC 12.8*  HGB 11.5*  HCT 34.3*  MCV 79.6*  PLT 301   Basic  Metabolic Panel: Recent Labs  Lab 09/07/23 1924  NA 135  K 3.8  CL 102  CO2 22  GLUCOSE 108*  BUN 28*  CREATININE 1.33*  CALCIUM 9.7   GFR: Estimated Creatinine Clearance: 32.9 mL/min (A) (by C-G formula based on SCr of 1.33 mg/dL (H)).  BNP (last 3 results) Recent Labs    09/07/23 1923  BNP 157.3*   CBG: Recent Labs  Lab 09/07/23 1924  GLUCAP 99   Radiology Studies: DG Chest Portable 1 View Result Date: 09/08/2023 CLINICAL DATA:  Encounter for pacemaker EXAM: PORTABLE CHEST 1 VIEW COMPARISON:  Chest x-ray 12/05/2021 FINDINGS: Left-sided pacemaker is unchanged in position. The heart is mildly enlarged, unchanged. The lungs are clear. There is no pleural effusion or pneumothorax. No acute osseous abnormality. IMPRESSION: 1. No active disease. 2. Mild cardiomegaly. Electronically Signed   By: Darliss Cheney M.D.   On: 09/08/2023 02:42    Scheduled Meds:  apixaban  5 mg Oral BID   metoprolol tartrate  50 mg Oral BID   pantoprazole  40 mg Oral Daily   Continuous Infusions:   LOS: 0 days   Time spent: 40 minutes  Carollee Herter, DO  [facility]  09/08/2023, 10:05 AM

## 2023-09-08 NOTE — Progress Notes (Signed)
0630: This nurse called Lab to ask about the process of blood work that was ordered for this AM. Lab referred this nurse to Phlebotomist number to call and ask why it has not been done yet. This nurse tried to call pheblotomist number and it was busy "busy tone".   0710: This nurse gave report to day shift nurse Udora, and the samples has not been collected yet. Day shift nurse is aware.

## 2023-09-08 NOTE — ED Notes (Signed)
ED TO INPATIENT HANDOFF REPORT  ED Nurse Name and Phone #: [phone]  S Name/Age/Gender Juanell Fairly 82 y.o. female Room/Bed: 026C/026C  Code Status   Code Status: Full Code  Home/SNF/Other Home Patient oriented to: self, place, time, and situation Is this baseline? Yes   Triage Complete: Triage complete  Chief Complaint Chest pain [R07.9]  Triage Note PER EMS: pt is from home with c/o intermittent dizziness and nausea that started today around 1400 when she stood up. Denies LOC or fall.   BP-180/80, HR-60s paced rhythm, O2-100% RA, CBG-102   Allergies Allergies  Allergen Reactions   Codeine Other (See Comments) and Nausea And Vomiting    GI upset Other reaction(s): stomach upset   Amlodipine Swelling     edema   Fosamax [Alendronate Sodium] Other (See Comments)    Tooth problems   Lisinopril Cough   Olmesartan Other (See Comments)    Possible photodermatitis     Level of Care/Admitting Diagnosis ED Disposition     ED Disposition  Admit   Condition  --   Comment  Hospital Area: [facility] [[id number]]  Level of Care: Telemetry Cardiac [[id number]]  May place patient in observation at [facility] or [facility] if equivalent level of care is available:: No  Covid Evaluation: Asymptomatic - no recent exposure (last 10 days) testing not required  Diagnosis: Chest pain [536644]  Admitting Physician: Angie Fava [[id number]]  Attending Physician: Angie Fava [[id number]]          B Medical/Surgery History Past Medical History:  Diagnosis Date   Bundle branch block, left    Dyslipidemia    GERD (gastroesophageal reflux disease)    Hypercholesteremia    Hypertension    LBBB (left bundle branch block)    Paroxysmal atrial fibrillation (HCC)    UTI (urinary tract infection)    Past Surgical History:  Procedure Laterality Date   bladder polyp removal     BREAST EXCISIONAL BIOPSY Right    60 yrs ago- Benign   BREAST LUMPECTOMY Left     No Visible Scar, said 30 + years ago in situ, no chemo, no radiation, or hormone replacement     CARDIOVERSION N/A 07/30/2018   Procedure: CARDIOVERSION;  Surgeon: Jake Bathe, MD;  Location: [facility] ENDOSCOPY;  Service: Cardiovascular;  Laterality: N/A;   CARDIOVERSION N/A 11/28/2021   Procedure: CARDIOVERSION;  Surgeon: Marinus Maw, MD;  Location: [facility] INVASIVE CV LAB;  Service: Cardiovascular;  Laterality: N/A;   CESAREAN SECTION     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   Procedure: PACEMAKER IMPLANT;  Surgeon: Marinus Maw, MD;  Location: [facility] INVASIVE CV LAB;  Service: Cardiovascular;  Laterality: N/A;     A IV Location/Drains/Wounds Patient Lines/Drains/Airways Status     Active Line/Drains/Airways     Name Placement date Placement time Site Days   Peripheral IV 09/07/23 20 G Anterior;Proximal;Right Antecubital 09/07/23  1923  Antecubital  1            Intake/Output Last 24 hours No intake or output data in the 24 hours ending 09/08/23 0243  Labs/Imaging Results for orders placed or performed during the hospital encounter of 09/07/23 (from the past 48 hours)  Basic metabolic panel     Status: Abnormal   Collection Time: 09/07/23  7:24 PM  Result Value Ref Range   Sodium 135 135 - 145 mmol/L   Potassium 3.8 3.5 - 5.1 mmol/L   Chloride 102 98 - 111 mmol/L  CO2 22 22 - 32 mmol/L   Glucose, Bld 108 (H) 70 - 99 mg/dL    Comment: Glucose reference range applies only to samples taken after fasting for at least 8 hours.   BUN 28 (H) 8 - 23 mg/dL   Creatinine, Ser 6.19 (H) 0.44 - 1.00 mg/dL   Calcium 9.7 8.9 - 50.9 mg/dL   GFR, Estimated 40 (L) >60 mL/min    Comment: (NOTE) Calculated using the CKD-EPI Creatinine Equation (2021)    Anion gap 11 5 - 15    Comment: Performed at [facility], [name] [place], [place] [address]  CBC     Status: Abnormal   Collection Time: 09/07/23  7:24 PM  Result Value Ref Range   WBC 12.8 (H) 4.0 - 10.5 K/uL   RBC 4.31 3.87 -  5.11 MIL/uL   Hemoglobin 11.5 (L) 12.0 - 15.0 g/dL   HCT 24.5 (L) 80.9 - 98.3 %   MCV 79.6 (L) 80.0 - 100.0 fL   MCH 26.7 26.0 - 34.0 pg   MCHC 33.5 30.0 - 36.0 g/dL   RDW 38.2 50.5 - 39.7 %   Platelets 301 150 - 400 K/uL   nRBC 0.0 0.0 - 0.2 %    Comment: Performed at [facility] Lab, [address] [place], [place] [address]  CBG monitoring, ED     Status: None   Collection Time: 09/07/23  7:24 PM  Result Value Ref Range   Glucose-Capillary 99 70 - 99 mg/dL    Comment: Glucose reference range applies only to samples taken after fasting for at least 8 hours.   Comment 1 Notify RN    Comment 2 Document in Chart   Troponin I (High Sensitivity)     Status: None   Collection Time: 09/07/23  7:24 PM  Result Value Ref Range   Troponin I (High Sensitivity) 15 <18 ng/L    Comment: (NOTE) Elevated high sensitivity troponin I (hsTnI) values and significant  changes across serial measurements may suggest ACS but many other  chronic and acute conditions are known to elevate hsTnI results.  Refer to the "Links" section for chest pain algorithms and additional  guidance. Performed at [facility], [address], [place], [place] [address]   Urinalysis, Routine w reflex microscopic -Urine, Clean Catch     Status: Abnormal   Collection Time: 09/07/23  9:34 PM  Result Value Ref Range   Color, Urine STRAW (A) YELLOW   APPearance CLEAR CLEAR   Specific Gravity, Urine 1.009 1.005 - 1.030   pH 6.0 5.0 - 8.0   Glucose, UA NEGATIVE NEGATIVE mg/dL   Hgb urine dipstick SMALL (A) NEGATIVE   Bilirubin Urine NEGATIVE NEGATIVE   Ketones, ur NEGATIVE NEGATIVE mg/dL   Protein, ur NEGATIVE NEGATIVE mg/dL   Nitrite NEGATIVE NEGATIVE   Leukocytes,Ua NEGATIVE NEGATIVE   RBC / HPF 0-5 0 - 5 RBC/hpf   WBC, UA 0-5 0 - 5 WBC/hpf   Bacteria, UA NONE SEEN NONE SEEN   Squamous Epithelial / HPF 0-5 0 - 5 /HPF    Comment: Performed at [facility], [address], [place], [place] [address]   Troponin I (High Sensitivity)     Status: Abnormal   Collection Time: 09/07/23  9:47 PM  Result Value Ref Range   Troponin I (High Sensitivity) 23 (H) <18 ng/L    Comment: (NOTE) Elevated high sensitivity troponin I (hsTnI) values and significant  changes  across serial measurements may suggest ACS but many other  chronic and acute conditions are known to elevate hsTnI results.  Refer to the "Links" section for chest pain algorithms and additional  guidance. Performed at [facility] Lab, [address], [place], [place] [address]    No results found.  Pending Labs Unresulted Labs (From admission, onward)     Start     Ordered   09/08/23 0500  CBC with Differential/Platelet  Tomorrow morning,   R        09/08/23 0233   09/08/23 0500  Comprehensive metabolic panel  Tomorrow morning,   R        09/08/23 0233   09/08/23 0233  Magnesium  Add-on,   AD        09/08/23 0233            Vitals/Pain Today's Vitals   09/08/23 0100 09/08/23 0123 09/08/23 0158 09/08/23 0200  BP: (!) 185/71 (!) 195/76  (!) 175/60  Pulse: 61 65  65  Resp: 12 17  16   Temp:   98.3 F (36.8 C)   TempSrc:   Oral   SpO2: 100% 98%  93%  Weight:      Height:      PainSc:        Isolation Precautions No active isolations  Medications Medications  acetaminophen (TYLENOL) tablet 650 mg (has no administration in time range)    Or  acetaminophen (TYLENOL) suppository 650 mg (has no administration in time range)  melatonin tablet 3 mg (has no administration in time range)  ondansetron (ZOFRAN) injection 4 mg (has no administration in time range)  potassium chloride SA (KLOR-CON M) CR tablet 40 mEq (has no administration in time range)    Mobility walks     Focused Assessments     R Recommendations: See Admitting Provider Note  Report given to:   Additional Notes: A&O x 4 / standby assist to the BR / continent /

## 2023-09-08 NOTE — Hospital Course (Addendum)
HPI: Lisa [name] is a 82 y.o. 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    Over the last day, the patient has experienced 2 episodes of palpitations followed by development of sharp nonradiating left-sided chest discomfort associated with shortness of breath, dizziness, without ensuing loss of consciousness without fall.  These episodes occur while at rest, and have both resolved spontaneously within 1 to 2 minutes of onset, responding with resolution of palpitations, in the absence of any interval nitroglycerin.  She denies any residual or new chest pain in between these episodes of palpitations.  Denies any recent subjective fever, chills or rigors, generalized myalgias.  Recent cough or trauma.   She has a history of paroxysmal atrial fibrillation, accompanied by sick sinus syndrome, complete heart block, for which she underwent pacemaker placement on 12/04/2021 in the [facility] health system.  She follows with Dr. Swaziland as her outpatient cardiologist, and notes good compliance with chronic anticoagulation on home Eliquis.  In terms of outpatient AV nodal blocking regimen, she is on Lopressor 25 mg p.o. twice daily.   Most recent echocardiogram occurred in March 2023 and was notable for LVEF 66 5%, no evidence of focal motion abnormalities, grade 3 diastolic dysfunction with normal right ventricular systolic function, moderately dilated left atrium, mildly dilated right atrium and no evidence of significant valvular pathology.   She confirms that she is currently chest pain-free, and that she took a full dose  aspirin x 1 at home prior to presenting to the emergency department this evening for       ED Course:  Vital signs in the ED were notable for the following: Afebrile; heart rates in the 60s to 70s; systolic blood pressure in the 160s 20s; respiratory rate 16-18, oxygen saturation 97 today 100% on room air.   Labs were notable for the following: BMP, which was notable for sodium 135 potassium 3.8, creatinine 1.33 compared to 1.25 on 7-24.  High-sensitivity troponin I initially 15, with repeat value trending up at 23.  CBC notable for with a cell count 12,800, hemoglobin 11.5 compared to 12.2 on 7-24, platelet count 301.  Urinalysis notable for noted blood cells, leukocyte esterase/nitrate negative.   Per my interpretation, EKG in ED demonstrated the following: Atrial paced rhythm, noting the presence of pacer spikes, left bundle branch block, heart rate 61 no evidence of T wave or ST changes, including no evidence of ST elevation.   Imaging in the ED, per corresponding formal radiology read, was notable for the following: 1 view chest x-ray shows no evidence of acute cardiopulmonary process, including no evidence of infiltrate, edema, effusion, or pneumothorax.   While in the ED, the following were administered: None.   Subsequently, the patient was admitted further evaluation management of atypical chest pain.   Significant Events: Admitted 09/07/2023 for atypical CP   Significant Labs: Admission sodium 135 potassium 3.8, creatinine 1.33 compared to 1.25 on 7-24. High-sensitivity troponin I initially 15, with repeat value trending up at 23. CBC notable for with a cell count 12,800, hemoglobin 11.5 compared to 12.2 on 7-24, platelet count 301. Urinalysis notable for noted blood cells, leukocyte esterase/nitrate negative.   Significant Imaging Studies: Admission  chest x-ray shows no evidence of acute cardiopulmonary process, including no evidence of infiltrate, edema, effusion, or pneumothorax    Antibiotic Therapy: Anti-infectives (From admission, onward)    None       Procedures:   Consultants:

## 2023-09-08 NOTE — Assessment & Plan Note (Signed)
09-08-2023 continue protonix. Change to po.  09-09-2023 pt continues to c/o of GERD. Will increase protonix to bid.

## 2023-09-08 NOTE — Assessment & Plan Note (Signed)
09-08-2023 not ACS. Maybe tachycardia related. Trending troponin.

## 2023-09-08 NOTE — ED Notes (Signed)
Pt ambulated to the bathroom with a steady gait with standby assistance.

## 2023-09-08 NOTE — Assessment & Plan Note (Signed)
09-08-2023 mild AKI with Scr 1.33. have stopped losartan and hydrochlorothiazide for now. Repeat BMP in AM.  09-08-2023 Scr down to 1.23. have stopped hydrochlorothiazide and diovan. Do not restart at discharge.  *At time of discharge, I was specifically instructed to continue pt's hydrochlorothiazide 12.5 mg qday and losartan 100 mg qday by Dr. Tenny Craw.

## 2023-09-08 NOTE — Assessment & Plan Note (Signed)
09-08-2023 unclear the cause of her CP. I suspect it was from rapid afib. Will have cards look at her pacemaker interrogation. She may need another pacer interrogation to verify her rapid afib this AM.  09-09-2023 resolved. Discussed that mild elevation in her troponin are likely result of her rapid afib. Advised her that she is not having an AMI.

## 2023-09-08 NOTE — Assessment & Plan Note (Signed)
09-08-2023 likely her rapid afib from yesterday.  09-09-2023 resolved.

## 2023-09-08 NOTE — ED Provider Notes (Signed)
[facility] 3E HF PCU Provider Note   CSN: [id number] Arrival date & time: 09/07/23  1907     History  Chief Complaint  Patient presents with   Dizziness    Lisa [name] is a 82 y.o. female.  The history is provided by the patient and the spouse.  Dizziness Quality:  Lightheadedness Severity:  Severe Onset quality:  Sudden Timing:  Constant Progression:  Resolved Chronicity:  New Context comment:  Palpitations Relieved by:  Nothing Worsened by:  Nothing Ineffective treatments:  None tried Associated symptoms: chest pain, nausea and palpitations   Risk factors: no anemia        Home Medications Prior to Admission medications   Medication Sig Start Date End Date Taking? Authorizing Provider  acetaminophen (ACETAMINOPHEN 8 HOUR) 650 MG CR tablet Take 650 mg by mouth as needed for pain (right knee pain).   Yes [provider]  apixaban (ELIQUIS) 5 MG TABS tablet TAKE 1 TABLET BY MOUTH TWICE DAILY 04/12/23  Yes [place], Peter M, MD  Cholecalciferol (VITAMIN D3) 50 MCG (2000 UT) TABS Take 2,000 Units by mouth daily.   Yes [provider]  hydrochlorothiazide (HYDRODIURIL) 12.5 MG tablet Take 1 tablet (12.5 mg total) by mouth daily. 04/17/23  Yes [place], Peter M, MD  losartan (COZAAR) 100 MG tablet Take 1 tablet (100 mg total) by mouth daily. 10/31/22  Yes [place], Peter M, MD  metoprolol tartrate (LOPRESSOR) 25 MG tablet TAKE 1 TABLET BY MOUTH TWICE DAILY. GENERIC EQUIVALENT FOR LOPRESSOR 01/01/23  Yes [place], Peter M, MD  pantoprazole (PROTONIX) 20 MG tablet Take 20 mg by mouth daily as needed (gerd/heartburn). 07/04/18  Yes [provider]      Allergies    Codeine, Amlodipine, Fosamax [alendronate sodium], Lisinopril, and Olmesartan    Review of Systems   Review of Systems  Constitutional:  Negative for fever.  HENT:  Negative for ear pain.   Cardiovascular:  Positive for chest pain and palpitations.  Gastrointestinal:  Positive for  nausea.  Neurological:  Positive for light-headedness.  All other systems reviewed and are negative.   Physical Exam Updated Vital Signs BP 136/77 (BP Location: Right Arm)   Pulse 67   Temp 98.2 F (36.8 C) (Oral)   Resp 16   Ht 5\' 5"  (1.651 m)   Wt 71.5 kg   SpO2 93%   BMI 26.23 kg/m  Physical Exam Vitals and nursing note reviewed.  Constitutional:      General: She is not in acute distress.    Appearance: Normal appearance. She is well-developed.  HENT:     Head: Normocephalic and atraumatic.     Nose: Nose normal.  Eyes:     Pupils: Pupils are equal, round, and reactive to light.  Cardiovascular:     Rate and Rhythm: Normal rate and regular rhythm.     Pulses: Normal pulses.     Heart sounds: Normal heart sounds.  Pulmonary:     Effort: Pulmonary effort is normal. No respiratory distress.     Breath sounds: Normal breath sounds.  Abdominal:     General: Bowel sounds are normal. There is no distension.     Palpations: Abdomen is soft.     Tenderness: There is no abdominal tenderness. There is no guarding or rebound.  Genitourinary:    Vagina: No vaginal discharge.  Musculoskeletal:        General: Normal range of motion.     Cervical back: Normal range of motion  and neck supple.  Skin:    General: Skin is warm and dry.     Capillary Refill: Capillary refill takes less than 2 seconds.     Findings: No erythema or rash.  Neurological:     General: No focal deficit present.     Mental Status: She is alert and oriented to person, place, and time.     Deep Tendon Reflexes: Reflexes normal.  Psychiatric:        Mood and Affect: Mood normal.     ED Results / Procedures / Treatments   Labs (all labs ordered are listed, but only abnormal results are displayed) Results for orders placed or performed during the hospital encounter of 09/07/23  Brain natriuretic peptide   Collection Time: 09/07/23  7:23 PM  Result Value Ref Range   B Natriuretic Peptide 157.3 (H)  0.0 - 100.0 pg/mL  Basic metabolic panel   Collection Time: 09/07/23  7:24 PM  Result Value Ref Range   Sodium 135 135 - 145 mmol/L   Potassium 3.8 3.5 - 5.1 mmol/L   Chloride 102 98 - 111 mmol/L   CO2 22 22 - 32 mmol/L   Glucose, Bld 108 (H) 70 - 99 mg/dL   BUN 28 (H) 8 - 23 mg/dL   Creatinine, Ser 0.86 (H) 0.44 - 1.00 mg/dL   Calcium 9.7 8.9 - 57.8 mg/dL   GFR, Estimated 40 (L) >60 mL/min   Anion gap 11 5 - 15  CBC   Collection Time: 09/07/23  7:24 PM  Result Value Ref Range   WBC 12.8 (H) 4.0 - 10.5 K/uL   RBC 4.31 3.87 - 5.11 MIL/uL   Hemoglobin 11.5 (L) 12.0 - 15.0 g/dL   HCT 46.9 (L) 62.9 - 52.8 %   MCV 79.6 (L) 80.0 - 100.0 fL   MCH 26.7 26.0 - 34.0 pg   MCHC 33.5 30.0 - 36.0 g/dL   RDW 41.3 24.4 - 01.0 %   Platelets 301 150 - 400 K/uL   nRBC 0.0 0.0 - 0.2 %  CBG monitoring, ED   Collection Time: 09/07/23  7:24 PM  Result Value Ref Range   Glucose-Capillary 99 70 - 99 mg/dL   Comment 1 Notify RN    Comment 2 Document in Chart   Troponin I (High Sensitivity)   Collection Time: 09/07/23  7:24 PM  Result Value Ref Range   Troponin I (High Sensitivity) 15 <18 ng/L  Urinalysis, Routine w reflex microscopic -Urine, Clean Catch   Collection Time: 09/07/23  9:34 PM  Result Value Ref Range   Color, Urine STRAW (A) YELLOW   APPearance CLEAR CLEAR   Specific Gravity, Urine 1.009 1.005 - 1.030   pH 6.0 5.0 - 8.0   Glucose, UA NEGATIVE NEGATIVE mg/dL   Hgb urine dipstick SMALL (A) NEGATIVE   Bilirubin Urine NEGATIVE NEGATIVE   Ketones, ur NEGATIVE NEGATIVE mg/dL   Protein, ur NEGATIVE NEGATIVE mg/dL   Nitrite NEGATIVE NEGATIVE   Leukocytes,Ua NEGATIVE NEGATIVE   RBC / HPF 0-5 0 - 5 RBC/hpf   WBC, UA 0-5 0 - 5 WBC/hpf   Bacteria, UA NONE SEEN NONE SEEN   Squamous Epithelial / HPF 0-5 0 - 5 /HPF  Troponin I (High Sensitivity)   Collection Time: 09/07/23  9:47 PM  Result Value Ref Range   Troponin I (High Sensitivity) 23 (H) <18 ng/L   DG Chest Portable 1  View Result Date: 09/08/2023 CLINICAL DATA:  Encounter for pacemaker EXAM: PORTABLE CHEST  1 VIEW COMPARISON:  Chest x-ray 12/05/2021 FINDINGS: Left-sided pacemaker is unchanged in position. The heart is mildly enlarged, unchanged. The lungs are clear. There is no pleural effusion or pneumothorax. No acute osseous abnormality. IMPRESSION: 1. No active disease. 2. Mild cardiomegaly. Electronically Signed   By: Darliss Cheney M.D.   On: 09/08/2023 02:42   CUP PACEART REMOTE DEVICE CHECK Result Date: 09/04/2023 Scheduled remote reviewed. Normal device function.  Next remote 91 days. LA, CVRS  MM 3D SCREENING MAMMOGRAM BILATERAL BREAST Result Date: 08/28/2023 CLINICAL DATA:  Screening. EXAM: DIGITAL SCREENING BILATERAL MAMMOGRAM WITH TOMOSYNTHESIS AND CAD TECHNIQUE: Bilateral screening digital craniocaudal and mediolateral oblique mammograms were obtained. Bilateral screening digital breast tomosynthesis was performed. The images were evaluated with computer-aided detection. COMPARISON:  Previous exam(s). ACR Breast Density Category a: The breasts are almost entirely fatty. FINDINGS: There are no findings suspicious for malignancy. IMPRESSION: No mammographic evidence of malignancy. A result letter of this screening mammogram will be mailed directly to the patient. RECOMMENDATION: Screening mammogram in one year. (Code:[id number]-B-01Y) BI-RADS CATEGORY  1: Negative. Electronically Signed   By: Baird Lyons M.D.   On: 08/28/2023 11:25    EKG EKG Interpretation Date/Time:  Saturday September 07 2023 19:12:30 EST Ventricular Rate:  61 PR Interval:  174 QRS Duration:  148 QT Interval:  442 QTC Calculation: 444 R Axis:   -37  Text Interpretation: Normal sinus rhythm ATRIAL PACED RHYTHM Left axis deviation Left bundle branch block Confirmed by Nicanor Alcon, Payden Docter ([id number]) on 09/08/2023 12:36:59 AM  Radiology DG Chest Portable 1 View Result Date: 09/08/2023 CLINICAL DATA:  Encounter for pacemaker EXAM: PORTABLE CHEST  1 VIEW COMPARISON:  Chest x-ray 12/05/2021 FINDINGS: Left-sided pacemaker is unchanged in position. The heart is mildly enlarged, unchanged. The lungs are clear. There is no pleural effusion or pneumothorax. No acute osseous abnormality. IMPRESSION: 1. No active disease. 2. Mild cardiomegaly. Electronically Signed   By: Darliss Cheney M.D.   On: 09/08/2023 02:42    Procedures Procedures    Medications Ordered in ED Medications  acetaminophen (TYLENOL) tablet 650 mg (has no administration in time range)    Or  acetaminophen (TYLENOL) suppository 650 mg (has no administration in time range)  melatonin tablet 3 mg (has no administration in time range)  ondansetron (ZOFRAN) injection 4 mg (has no administration in time range)  pantoprazole (PROTONIX) injection 40 mg (40 mg Intravenous Given 09/08/23 0421)  apixaban (ELIQUIS) tablet 5 mg (has no administration in time range)  hydrochlorothiazide (HYDRODIURIL) tablet 12.5 mg (has no administration in time range)  losartan (COZAAR) tablet 100 mg (has no administration in time range)  metoprolol tartrate (LOPRESSOR) tablet 50 mg (has no administration in time range)  potassium chloride SA (KLOR-CON M) CR tablet 40 mEq (40 mEq Oral Given 09/08/23 0307)    ED Course/ Medical Decision Making/ A&P                                 Medical Decision Making Patient with heart racing and near syncope x 2   Amount and/or Complexity of Data Reviewed External Data Reviewed: notes.    Details: Previous notes  Labs: ordered.    Details: Normal sodium 135, normal potassium 3.8, elevated creatinine 1.33. elevated white count 12.8, hemoglobin low 11.5, normal platelets normal urine elevated creatinine 80  Radiology: ordered.    Details: Normal cxr by me  ECG/medicine tests: ordered. Decision-making details documented in ED Course.  Risk Decision regarding hospitalization.    Final Clinical Impression(s) / ED Diagnoses Final diagnoses:  Palpitations   Elevated troponin I level  Near syncope   The patient appears reasonably stabilized for admission considering the current resources, flow, and capabilities available in the ED at this time, and I doubt any other [facility] requiring further screening and/or treatment in the ED prior to admission.  Rx / DC Orders ED Discharge Orders     None         Mana Morison, MD 09/08/23 [phone]

## 2023-09-09 ENCOUNTER — Observation Stay (HOSPITAL_BASED_OUTPATIENT_CLINIC_OR_DEPARTMENT_OTHER): Payer: Medicare Other

## 2023-09-09 DIAGNOSIS — N179 Acute kidney failure, unspecified: Secondary | ICD-10-CM | POA: Diagnosis not present

## 2023-09-09 DIAGNOSIS — I480 Paroxysmal atrial fibrillation: Secondary | ICD-10-CM | POA: Diagnosis not present

## 2023-09-09 DIAGNOSIS — R079 Chest pain, unspecified: Secondary | ICD-10-CM | POA: Diagnosis not present

## 2023-09-09 DIAGNOSIS — R002 Palpitations: Secondary | ICD-10-CM | POA: Diagnosis not present

## 2023-09-09 DIAGNOSIS — R0789 Other chest pain: Secondary | ICD-10-CM | POA: Diagnosis not present

## 2023-09-09 LAB — ECHOCARDIOGRAM COMPLETE
AR max vel: 2.3 cm2
AV Area VTI: 2.81 cm2
AV Area mean vel: 2.19 cm2
AV Mean grad: 4 mm[Hg]
AV Peak grad: 7.2 mm[Hg]
Ao pk vel: 1.34 m/s
Area-P 1/2: 3.87 cm2
Est EF: 50
Height: 65 in
MV VTI: 2.17 cm2
S' Lateral: 3.3 cm
Weight: 2465.62 [oz_av]

## 2023-09-09 MED ORDER — HYDRALAZINE HCL 25 MG PO TABS
25.0000 mg | ORAL_TABLET | Freq: Three times a day (TID) | ORAL | Status: DC
  Administered 2023-09-09: 25 mg via ORAL
  Filled 2023-09-09: qty 1

## 2023-09-09 MED ORDER — HYDROCHLOROTHIAZIDE 12.5 MG PO TABS: 12.5000 mg | ORAL_TABLET | Freq: Every day | ORAL | Status: DC

## 2023-09-09 MED ORDER — PANTOPRAZOLE SODIUM 40 MG PO TBEC: 40.0000 mg | DELAYED_RELEASE_TABLET | Freq: Two times a day (BID) | ORAL | 0 refills | Status: DC

## 2023-09-09 MED ORDER — PANTOPRAZOLE SODIUM 40 MG PO TBEC: 40.0000 mg | DELAYED_RELEASE_TABLET | Freq: Two times a day (BID) | ORAL | Status: DC

## 2023-09-09 MED ORDER — METOPROLOL TARTRATE 50 MG PO TABS: 50.0000 mg | ORAL_TABLET | Freq: Two times a day (BID) | ORAL | 0 refills | Status: DC

## 2023-09-09 MED ORDER — LOSARTAN POTASSIUM 50 MG PO TABS: 100.0000 mg | ORAL_TABLET | Freq: Every day | ORAL | Status: DC

## 2023-09-09 NOTE — Care Management Obs Status (Signed)
MEDICARE OBSERVATION STATUS NOTIFICATION   Patient Details  Name: Lisa [name] MRN: [id number] Date of Birth: 23-Dec-1940   Medicare Observation Status Notification Given:  Yes    Ronny Bacon, RN 09/09/2023, 7:37 AM

## 2023-09-09 NOTE — Plan of Care (Signed)

## 2023-09-09 NOTE — Consult Note (Addendum)
Cardiology Consultation   Patient ID: Lisa [name] MRN: [id number]; DOB: 03/14/1941  Admit date: 09/07/2023 Date of Consult: 09/09/2023  PCP:  Georgina Quint, MD   [facility] Providers Cardiologist:  Peter Swaziland, MD  Electrophysiologist:  Lewayne Bunting, MD       Patient Profile:   Lisa [name] is a 82 y.o. female with a history of paroxysmal atrial fibrillation on Eliquis, complete heart block s/p PPM in 11/2021, LBBB, hypertension complicated by orthostatic hypotension in the past, hyperlipidemia, and GERD who is being seen 09/09/2023 for the evaluation of atrial fibrillation at the request of Dr. Imogene Burn.  History of Present Illness:   Lisa [name] is a 82 year old female with the above history who is followed by Dr. Swaziland and Dr. Ladona Ridgel.  She has a history of normal Myoview in 2006 and in 09/2017. She was diagnosed with atrial fibrillation later in 2019.  Echo showed LVEF of 40-45% with diffuse hypokinesis and septal abnormality consistent with LBBB. She was started on Eliquis and underwent successful DCCV in 07/2018 restoration of sinus rhythm.  He was noted to have recurrent atrial fibrillation in 11/2021.  Repeat echo showed LVEF of 60-65% with normal wall motion and grade 3 diastolic dysfunction.  She underwent recurrent DCCV.  However, a few days later, she presented with Stokes Adams attacks and was noted to be in intermittent complete heart block with rates in the 20s requiring Atropine.  She was seen by EP and underwent implantation of chamber Biotronik PPM.  She will was recently seen by Dr. Swaziland on 08/27/2023 at which time she was doing very well from a cardiac standpoint.  Patient presented to the ED on 09/08/2023 for further evaluation of palpitations with associated chest pain, shortness of breath, and dizziness. EKG showed atrial fibrillation rate 101 bpm, with known LBBB.  High-sensitivity troponin minimally elevated and flat at 15 >> 23 >> 32.  BNP  mildly elevated at 157.3.  Chest x-ray showed mild cardiomegaly but no acute findings. WBC 12.8, Hgb 11.5, Plts 301. Na 135, K 3.8, Glucose 108, BUN 28, Cr 1.33. Magnesium 1.7. Lipase normal. Procalcitonin negative.  Her Lopressor was increased and she was admitted for further evaluation.  Cardiology consulted today at the request of family.  Patient states she has been struggling with elevated BP for the last month with systolic BP as high as the 190s at times.  Otherwise, she has been in her usual state of health.  She developed sudden onset of palpitations that she describes as "heart racing" and her heart "beating outside of her chest" around 2 PM on 09/07/2023.  She had associated shortness of breath, nausea, and near syncope with this.  Heart rate settled down some after she rested in a recliner but never fully resolved.  She checked her vital and systolic BP was in the 190s and heart rates was in the 90s. She states around 4 PM she went to stand up from dinner and thought that she was going to pass out which is why she ended up coming to the ED.  She also describes a very vague "tinge" in her chest wall with these palpitations but no real chest pain.  Symptoms completely resolved when she came out of atrial fibrillation and she denies any other cardiac symptoms recently.  No chest pain/discomfort, shortness of breath, lightheadedness, dizziness, near syncope outside of th episode of palpitations.  No orthopnea, PND, edema.  She denies any recent fevers or illnesses.  No  abnormal bleeding on Eliquis.  Is currently back in normal sinus rhythm.  Telemetry shows an atrial paced rhythm with rates in the 60s.  Her pacemaker was interrogated in the ED. I am currently unable to see this report but per primary team histogram of heart rate showed did not show a spike in her heart rate > 100.    Past Medical History:  Diagnosis Date   Bundle branch block, left    Dyslipidemia    GERD (gastroesophageal reflux  disease)    Hypercholesteremia    Hypertension    LBBB (left bundle branch block)    Paroxysmal atrial fibrillation (HCC)    UTI (urinary tract infection)     Past Surgical History:  Procedure Laterality Date   bladder polyp removal     BREAST EXCISIONAL BIOPSY Right    60 yrs ago- Benign   BREAST LUMPECTOMY Left    No Visible Scar, said 30 + years ago in situ, no chemo, no radiation, or hormone replacement     CARDIOVERSION N/A 07/30/2018   Procedure: CARDIOVERSION;  Surgeon: Jake Bathe, MD;  Location: [facility] ENDOSCOPY;  Service: Cardiovascular;  Laterality: N/A;   CARDIOVERSION N/A 11/28/2021   Procedure: CARDIOVERSION;  Surgeon: Marinus Maw, MD;  Location: [facility] INVASIVE CV LAB;  Service: Cardiovascular;  Laterality: N/A;   CESAREAN SECTION     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   Procedure: PACEMAKER IMPLANT;  Surgeon: Marinus Maw, MD;  Location: [facility] INVASIVE CV LAB;  Service: Cardiovascular;  Laterality: N/A;     Home Medications:  Prior to Admission medications   Medication Sig Start Date End Date Taking? Authorizing Provider  acetaminophen (ACETAMINOPHEN 8 HOUR) 650 MG CR tablet Take 650 mg by mouth as needed for pain (right knee pain).   Yes [provider]  apixaban (ELIQUIS) 5 MG TABS tablet TAKE 1 TABLET BY MOUTH TWICE DAILY 04/12/23  Yes Swaziland, Peter M, MD  Cholecalciferol (VITAMIN D3) 50 MCG (2000 UT) TABS Take 2,000 Units by mouth daily.   Yes [provider]  hydrochlorothiazide (HYDRODIURIL) 12.5 MG tablet Take 1 tablet (12.5 mg total) by mouth daily. 04/17/23  Yes Swaziland, Peter M, MD  losartan (COZAAR) 100 MG tablet Take 1 tablet (100 mg total) by mouth daily. 10/31/22  Yes Swaziland, Peter M, MD  metoprolol tartrate (LOPRESSOR) 25 MG tablet TAKE 1 TABLET BY MOUTH TWICE DAILY. GENERIC EQUIVALENT FOR LOPRESSOR 01/01/23  Yes Swaziland, Peter M, MD  pantoprazole (PROTONIX) 20 MG tablet Take 20 mg by mouth daily as needed (gerd/heartburn). 07/04/18  Yes [provider]    Inpatient Medications: Scheduled Meds:  apixaban  5 mg Oral BID   hydrALAZINE  25 mg Oral Q8H   metoprolol tartrate  50 mg Oral BID   pantoprazole  40 mg Oral BID   Continuous Infusions:  PRN Meds: acetaminophen **OR** acetaminophen, melatonin, ondansetron (ZOFRAN) IV  Allergies:    Allergies  Allergen Reactions   Codeine Other (See Comments) and Nausea And Vomiting    GI upset Other reaction(s): stomach upset   Amlodipine Swelling     edema   Fosamax [Alendronate Sodium] Other (See Comments)    Tooth problems   Lisinopril Cough   Olmesartan Other (See Comments)    Possible photodermatitis     Social History:   Social History   Socioeconomic History   Marital status: Married    Spouse name: Not on file   Number of children: 2   Years of education:  Not on file   Highest education level: Associate degree: occupational, technical, or vocational program  Occupational History   Not on file  Tobacco Use   Smoking status: Former    Current packs/day: 0.00    Types: Cigarettes    Start date: 68    Quit date: 1993    Years since quitting: 31.9   Smokeless tobacco: Never  Vaping Use   Vaping status: Never Used  Substance and Sexual Activity   Alcohol use: Yes   Drug use: No   Sexual activity: Not on file  Other Topics Concern   Not on file  Social History Narrative   Not on file   Social Drivers of Health   Financial Resource Strain: Low Risk  (04/30/2023)   Overall Financial Resource Strain (CARDIA)    Difficulty of Paying Living Expenses: Not hard at all  Food Insecurity: No Food Insecurity (09/08/2023)   Hunger Vital Sign    Worried About Running Out of Food in the Last Year: Never true    Ran Out of Food in the Last Year: Never true  Transportation Needs: No Transportation Needs (09/08/2023)   PRAPARE - Administrator, Civil Service (Medical): No    Lack of Transportation (Non-Medical): No  Physical Activity:  Sufficiently Active (04/30/2023)   Exercise Vital Sign    Days of Exercise per Week: 5 days    Minutes of Exercise per Session: 60 min  Stress: No Stress Concern Present (04/30/2023)   [employer] of Occupational Health - Occupational Stress Questionnaire    Feeling of Stress : Not at all  Social Connections: Moderately Isolated (04/30/2023)   Social Connection and Isolation Panel [NHANES]    Frequency of Communication with Friends and Family: Twice a week    Frequency of Social Gatherings with Friends and Family: Once a week    Attends Religious Services: Never    Database administrator or Organizations: No    Attends Banker Meetings: Never    Marital Status: Married  Catering manager Violence: Not At Risk (09/08/2023)   Humiliation, Afraid, Rape, and Kick questionnaire    Fear of Current or Ex-Partner: No    Emotionally Abused: No    Physically Abused: No    Sexually Abused: No    Family History:   Family History  Problem Relation Age of Onset   Breast cancer Mother    Hip fracture Mother    Ulcers Father    CVA Brother    Renal cancer Brother    Other Brother        hip replacement   CVA Brother        Had PaceMaker   Crohn's disease Brother    Diabetes Brother      ROS:  Please see the history of present illness.  All other ROS reviewed and negative.     Physical Exam/Data:   Vitals:   09/09/23 0033 09/09/23 0321 09/09/23 0426 09/09/23 0725  BP: 138/76  (!) 161/72 (!) 162/74  Pulse: 61  (!) 53 (!) 59  Resp: 19 17 20 17   Temp: 98.4 F (36.9 C)  97.9 F (36.6 C) 98.2 F (36.8 C)  TempSrc: Oral  Oral Oral  SpO2: 97%  91% 97%  Weight:  69.9 kg    Height:        Intake/Output Summary (Last 24 hours) at 09/09/2023 1028 Last data filed at 09/09/2023 0812 Gross per 24 hour  Intake 1080 ml  Output --  Net 1080 ml      09/09/2023    3:21 AM 09/08/2023    3:56 AM 09/07/2023    7:13 PM  Last 3 Weights  Weight (lbs) 154 lb 1.6 oz 157 lb  10.1 oz 165 lb  Weight (kg) 69.9 kg 71.5 kg 74.844 kg     Body mass index is 25.64 kg/m.  General: 82 y.o. Caucasian female resting comfortably in no acute distress. HEENT: Normocephalic and atraumatic. Sclera clear. EOMs intact. Neck: Supple. No JVD. Heart: RRR. Distinct S1 and S2. No murmurs, gallops, or rubs.  Lungs: No increased work of breathing. Clear to ausculation bilaterally. No wheezes, rhonchi, or rales. . Extremities: No lower extremity edema.    Skin: Warm and dry. Neuro: Alert and oriented x3. No focal deficits. Psych: Normal affect. Responds appropriately.   EKG:  The EKG was personally reviewed and demonstrates:   Atrial fibrillation rate 101 bpm, with known LBBB. Telemetry:  Telemetry was personally reviewed and demonstrates:  Atrial paced rhythm with rates in the 60s.   Relevant CV Studies:  Echocardiogram 09/09/2023: Impression:  1. Left ventricular ejection fraction, by estimation, is 50%. The left  ventricle has mildly decreased function. The left ventricle demonstrates  global hypokinesis with septal-lateral dyssynchrony consistent with LBBB.  There is mild concentric left  ventricular hypertrophy. Left ventricular diastolic parameters are  consistent with Grade II diastolic dysfunction (pseudonormalization).   2. Right ventricular systolic function is mildly reduced. The right  ventricular size is mildly enlarged. There is normal pulmonary artery  systolic pressure. The estimated right ventricular systolic pressure is  25.1 mmHg.   3. Left atrial size was moderately dilated.   4. The mitral valve is degenerative. No evidence of mitral valve  regurgitation. No evidence of mitral stenosis. The mean mitral valve  gradient is 2.5 mmHg. Moderate mitral annular calcification.   5. The aortic valve is tricuspid. There is mild calcification of the  aortic valve. Aortic valve regurgitation is not visualized. No aortic  stenosis is present.   6. The inferior vena  cava is normal in size with greater than 50%  respiratory variability, suggesting right atrial pressure of 3 mmHg.   7. A small pericardial effusion is present.    Laboratory Data:  High Sensitivity Troponin:   Recent Labs  Lab 09/07/23 1924 09/07/23 2147 09/08/23 1033  TROPONINIHS 15 23* 32*     Chemistry Recent Labs  Lab 09/07/23 1924 09/08/23 1033  NA 135 138  K 3.8 4.7  CL 102 106  CO2 22 23  GLUCOSE 108* 98  BUN 28* 22  CREATININE 1.33* 1.23*  CALCIUM 9.7 10.2  MG  --  1.7  GFRNONAA 40* 44*  ANIONGAP 11 9    Recent Labs  Lab 09/08/23 1033  PROT 6.9  ALBUMIN 3.7  AST 21  ALT 14  ALKPHOS 61  BILITOT 0.8   Lipids No results for input(s): "CHOL", "TRIG", "HDL", "LABVLDL", "LDLCALC", "CHOLHDL" in the last 168 hours.  Hematology Recent Labs  Lab 09/07/23 1924 09/08/23 1033  WBC 12.8* 11.6*  RBC 4.31 4.85  HGB 11.5* 12.8  HCT 34.3* 38.8  MCV 79.6* 80.0  MCH 26.7 26.4  MCHC 33.5 33.0  RDW 15.1 15.4  PLT 301 322   Thyroid  Recent Labs  Lab 09/08/23 1033  TSH 2.201    BNP Recent Labs  Lab 09/07/23 1923  BNP 157.3*    DDimer No results for input(s): "DDIMER" in the last 168  hours.   Radiology/Studies:  ECHOCARDIOGRAM COMPLETE Result Date: 09/09/2023    ECHOCARDIOGRAM REPORT   Patient Name:   Lisa [name] Date of Exam: 09/09/2023 Medical Rec #:  [id number]       Height:       65.0 in Accession #:    [id number]      Weight:       154.1 lb Date of Birth:  1940/12/07        BSA:          1.771 m Patient Age:    82 years        BP:           162/74 mmHg Patient Gender: F               HR:           64 bpm. Exam Location:  Inpatient Procedure: 2D Echo, Cardiac Doppler and Color Doppler Indications:    Chest Pain R07.9  History:        Patient has prior history of Echocardiogram examinations, most                 recent 11/30/2021. Arrythmias:Atrial Fibrillation; Risk                 Factors:Hypertension.  Sonographer:    Darlys Gales Referring Phys:  [id number] JUSTIN B HOWERTER IMPRESSIONS  1. Left ventricular ejection fraction, by estimation, is 50%. The left ventricle has mildly decreased function. The left ventricle demonstrates global hypokinesis with septal-lateral dyssynchrony consistent with LBBB. There is mild concentric left ventricular hypertrophy. Left ventricular diastolic parameters are consistent with Grade II diastolic dysfunction (pseudonormalization).  2. Right ventricular systolic function is mildly reduced. The right ventricular size is mildly enlarged. There is normal pulmonary artery systolic pressure. The estimated right ventricular systolic pressure is 25.1 mmHg.  3. Left atrial size was moderately dilated.  4. The mitral valve is degenerative. No evidence of mitral valve regurgitation. No evidence of mitral stenosis. The mean mitral valve gradient is 2.5 mmHg. Moderate mitral annular calcification.  5. The aortic valve is tricuspid. There is mild calcification of the aortic valve. Aortic valve regurgitation is not visualized. No aortic stenosis is present.  6. The inferior vena cava is normal in size with greater than 50% respiratory variability, suggesting right atrial pressure of 3 mmHg.  7. A small pericardial effusion is present. FINDINGS  Left Ventricle: Left ventricular ejection fraction, by estimation, is 50%. The left ventricle has mildly decreased function. The left ventricle demonstrates global hypokinesis. The left ventricular internal cavity size was normal in size. There is mild concentric left ventricular hypertrophy. Left ventricular diastolic parameters are consistent with Grade II diastolic dysfunction (pseudonormalization). Right Ventricle: The right ventricular size is mildly enlarged. No increase in right ventricular wall thickness. Right ventricular systolic function is mildly reduced. There is normal pulmonary artery systolic pressure. The tricuspid regurgitant velocity  is 2.35 m/s, and with an assumed right atrial  pressure of 3 mmHg, the estimated right ventricular systolic pressure is 25.1 mmHg. Left Atrium: Left atrial size was moderately dilated. Right Atrium: Right atrial size was normal in size. Pericardium: A small pericardial effusion is present. Mitral Valve: The mitral valve is degenerative in appearance. Moderate mitral annular calcification. No evidence of mitral valve regurgitation. No evidence of mitral valve stenosis. MV peak gradient, 4.9 mmHg. The mean mitral valve gradient is 2.5 mmHg. Tricuspid Valve: The tricuspid valve is normal in structure. Tricuspid valve regurgitation is  trivial. Aortic Valve: The aortic valve is tricuspid. There is mild calcification of the aortic valve. Aortic valve regurgitation is not visualized. No aortic stenosis is present. Aortic valve mean gradient measures 4.0 mmHg. Aortic valve peak gradient measures 7.2 mmHg. Aortic valve area, by VTI measures 2.81 cm. Pulmonic Valve: The pulmonic valve was normal in structure. Pulmonic valve regurgitation is not visualized. Aorta: The aortic root is normal in size and structure. Venous: The inferior vena cava is normal in size with greater than 50% respiratory variability, suggesting right atrial pressure of 3 mmHg. IAS/Shunts: No atrial level shunt detected by color flow Doppler. Additional Comments: A device lead is visualized in the right ventricle.  LEFT VENTRICLE PLAX 2D LVIDd:         4.30 cm   Diastology LVIDs:         3.30 cm   LV e' medial:    5.66 cm/s LV PW:         1.00 cm   LV E/e' medial:  15.8 LV IVS:        1.00 cm   LV e' lateral:   7.51 cm/s LVOT diam:     2.00 cm   LV E/e' lateral: 11.9 LV SV:         82 LV SV Index:   46 LVOT Area:     3.14 cm  RIGHT VENTRICLE RV S prime:     9.90 cm/s TAPSE (M-mode): 2.2 cm LEFT ATRIUM              Index        RIGHT ATRIUM           Index LA Vol (A2C):   104.0 ml 58.74 ml/m  RA Area:     11.00 cm LA Vol (A4C):   56.0 ml  31.63 ml/m  RA Volume:   21.50 ml  12.14 ml/m LA Biplane  Vol: 84.6 ml  47.78 ml/m  AORTIC VALVE AV Area (Vmax):    2.30 cm AV Area (Vmean):   2.19 cm AV Area (VTI):     2.81 cm AV Vmax:           134.00 cm/s AV Vmean:          94.000 cm/s AV VTI:            0.293 m AV Peak Grad:      7.2 mmHg AV Mean Grad:      4.0 mmHg LVOT Vmax:         98.20 cm/s LVOT Vmean:        65.600 cm/s LVOT VTI:          0.262 m LVOT/AV VTI ratio: 0.89  AORTA Ao Root diam: 2.60 cm Ao Asc diam:  3.30 cm MITRAL VALVE               TRICUSPID VALVE MV Area (PHT): 3.87 cm    TR Peak grad:   22.1 mmHg MV Area VTI:   2.17 cm    TR Vmax:        235.00 cm/s MV Peak grad:  4.9 mmHg MV Mean grad:  2.5 mmHg    SHUNTS MV Vmax:       1.10 m/s    Systemic VTI:  0.26 m MV Vmean:      75.9 cm/s   Systemic Diam: 2.00 cm MV Decel Time: 196 msec MV E velocity: 89.60 cm/s MV A velocity: 84.90 cm/s MV E/A ratio:  1.06 Dalton  McleanMD Electronically signed by Wilfred Lacy Signature Date/Time: 09/09/2023/10:25:17 AM    Final    DG Chest Portable 1 View Result Date: 09/08/2023 CLINICAL DATA:  Encounter for pacemaker EXAM: PORTABLE CHEST 1 VIEW COMPARISON:  Chest x-ray 12/05/2021 FINDINGS: Left-sided pacemaker is unchanged in position. The heart is mildly enlarged, unchanged. The lungs are clear. There is no pleural effusion or pneumothorax. No acute osseous abnormality. IMPRESSION: 1. No active disease. 2. Mild cardiomegaly. Electronically Signed   By: Darliss Cheney M.D.   On: 09/08/2023 02:42     Assessment and Plan:   Paroxysmal Atrial Fibrillation Patient has a history of paroxysmal atrial fibrillation with multiple cardioversions in the past.  However, she has not had any recurrent atrial fibrillation since having a pacemaker placed in 11/2021.  She presented with palpitations described as heart racing with associated shortness of breath, nausea, and near syncope.  She was noted to be in atrial fibrillation with RVR on arrival with rates reportedly as high as the 120s per chart review.   Electrolytes and TSH normal.  Showed LVEF of 50% with global hypokinesis with septal-lateral dyssynchrony consistent with LBBB and grade 2 diastolic dysfunction, mildly enlarged RV with mildly reduced RV function, moderately dilated left atrium, and small pericardial effusion. -Ambulate he has converted back to normal rhythm.  Telemetry shows atrial paced rhythm with rates in the 60s. -Home Lopressor has been increased to 50 mg twice daily.  Agree with this. -Continue chronic anticoagulation with Eliquis 5 mg twice daily. -If she has frequent episodes of paroxysmal atrial fibrillation, could consider adding amiodarone as an outpatient.  However, do not think this is necessary at this time.  Complete Heart Block S/p dual-chamber Biotronik PPM in 11/2021.  Device was interrogated on admission.  I am unable to currently see the report from this but per primary team histogram did not show any spikes of heart rate greater than 100 bpm. -Followed by Dr. Ladona Ridgel.  Demand Ischemia High-sensitivity troponin minimally elevated and flat at 15 >> 23 >> 32. EKG showed atrial fibrillation, rate 101 bpm, with known LBBB. Echo as above. - No angina. She described a very mild twinge when in atrial fibrillation but no cardiac chest pain.  - Troponin elevation consistent with demand ischemic from markedly elevated BP and atrial fibrillation. No ischemic work-up necessary.  Hypertension History of Orthostatic Hypotension She has a history of hypertension complicated by orthostatic hypotension in the past.  She reports she has been having elevated BP readings over the past month with systolic BP as high as the 190s at times. BP was markedly elevated on arrival with BP as high as 195/76. -BP improved but still elevated. Systolic BP mostly in the 150s to 160s today. -Lopressor has been increased to 50 mg twice daily as above. -She is on Losartan 100 mg daily and HCTZ 12.5 mg daily at home.  Both of these were held on  admission due to renal function.  However, renal function can looks to be at baseline from 03/2023.  Therefore we will restart these. -She was started on Hydralazine 25mg  three times daily here stop this given we are restarting home medications as above. However, would consider using PRN Hydralazine 25mg  for systolic BP >160.   CKD Stage III Creatinine 1.33 on admission. Previously 1.25 in 03/2023. - Stable at 1.23 today.  Disposition; I think patient is likely stable for discharged from a cardiac standpoint. MD to follow with additional recommendations. Will go ahead and arrange a follow-up  visit.   Risk Assessment/Risk Scores:     CHA2DS2-VASc Score = 4   This indicates a 4.8% annual risk of stroke. The patient's score is based upon: CHF History: 0 HTN History: 1 Diabetes History: 0 Stroke History: 0 Vascular Disease History: 0 Age Score: 2 Gender Score: 1   For questions or updates, please contact [facility] Please consult [email or web address] for contact info under    Signed, Corrin Parker, PA-C  09/09/2023 10:28 AM  Patient seen and examined   I agree with findings of C Irene Limbo above  Pt is an 82 yo with hx of PAF, s/p PPM for complete HB, LBBB, HTN Saturday she felt bad  Her heart was racing   she was SOB     Came to ER  HR 100s     Converted to SR     The pt says this is the worst spell she has had   When not in afibshe says her BP has been high for about 1 month   Otherwise breathing is OK  No CP    On exam Neck  JVP is normal    No bruits Lungs are CTA  Cardiac RRR   No S3   No murmurs  Abd   Supple  Ext are without edema  Impression PAF    Would keep on current regimen but increase b blocker as done     Follow as outpt Continue Eliquis  If has more spells will consider antiarrhythmic   Review with EP  HTN   BP has been high both before hospital and here    I would switch back to hydrochlorothiazide and losartan    Increase metoprolol    Will  follow   May indeed need another agent like hydralazine but will reassess in clinic      OK to go from cardiac standpoint   Will make sure she has follow up in EP  Dietrich Pates MD

## 2023-09-09 NOTE — TOC Initial Note (Signed)
Transition of Care [facility]) - Initial/Assessment Note    Patient Details  Name: Lisa [name] MRN: [id number] Date of Birth: [birth year]  Transition of Care [facility]) CM/SW Contact:    Leone Haven, RN Phone Number: 09/09/2023, 1:54 PM  Clinical Narrative:                 From home with spouse, has PCP and insurance on file, states has no HH services in place at this time or DME at home.  States spouse will transport her home at [employer] and he is support system, states gets medications from [facility] in [place] on [address].   Pta self ambulatory .  Expected Discharge Plan: Home/Self Care Barriers to Discharge: Continued Medical Work up   Patient Goals and CMS Choice Patient states their goals for this hospitalization and ongoing recovery are:: return home with spouse   Choice offered to / list presented to : NA      Expected Discharge Plan and Services In-house Referral: NA Discharge Planning Services: CM Consult Post Acute Care Choice: NA Living arrangements for the past 2 months: Single Family Home                 DME Arranged: N/A DME Agency: NA       HH Arranged: NA          Prior Living Arrangements/Services Living arrangements for the past 2 months: Single Family Home Lives with:: Spouse Patient language and need for interpreter reviewed:: Yes Do you feel safe going back to the place where you live?: Yes      Need for Family Participation in Patient Care: Yes (Comment) Care giver support system in place?: Yes (comment) Current home services: DME (her spouse has all the DME they neeed) Criminal Activity/Legal Involvement Pertinent to Current Situation/Hospitalization: No - Comment as needed  Activities of Daily Living   ADL Screening (condition at time of admission) Independently performs ADLs?: Yes (appropriate for developmental age) Is the patient deaf or have difficulty hearing?: No Does the patient have difficulty seeing, even when wearing  glasses/contacts?: No Does the patient have difficulty concentrating, remembering, or making decisions?: No  Permission Sought/Granted Permission sought to share information with : Case Manager Permission granted to share information with : Yes, Verbal Permission Granted              Emotional Assessment Appearance:: Appears stated age Attitude/Demeanor/Rapport: Engaged Affect (typically observed): Appropriate Orientation: : Oriented to Self, Oriented to Place, Oriented to  Time, Oriented to Situation Alcohol / Substance Use: Not Applicable Psych Involvement: No (comment)  Admission diagnosis:  Palpitations [R00.2] Elevated troponin I level [R79.89] Near syncope [R55] Chest pain [R07.9] Patient Active Problem List   Diagnosis Date Noted   Atypical chest pain 09/08/2023   Palpitations 09/08/2023   Elevated troponin 09/08/2023   Leukocytosis 09/08/2023   Chronic diastolic CHF (congestive heart failure) (HCC) 09/08/2023   Elevated troponin I level 09/08/2023   AKI (acute kidney injury) (HCC) 09/08/2023   Lower urinary tract symptoms 05/02/2023   Prediabetes 03/26/2023   Statin intolerance 03/26/2023   Thrombophilia (HCC) 09/25/2022   Current use of long term anticoagulation 09/25/2022   Dyslipidemia 03/14/2022   Bartholin cyst 02/02/2022   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   Age-related osteoporosis without current pathological fracture 11/27/2021   Chronic kidney disease, stage 3a (HCC) 11/27/2021   Essential hypertension 11/27/2021   Impaired fasting glucose 11/27/2021   Mixed hyperlipidemia 11/27/2021   Personal history of malignant  neoplasm of breast 11/27/2021   Personal history of urinary calculi 11/27/2021   Vitamin D deficiency 11/27/2021   Changing skin lesion 05/12/2021   Actinic keratosis 02/24/2019   Paroxysmal atrial fibrillation (HCC)    GERD (gastroesophageal reflux disease)    Left bundle branch block    Pilar cyst 04/17/2016   PCP:  Georgina Quint, MD Pharmacy:   [facility] - [place], [place] - [address] [facility] [place] [address] Phone: [phone] Fax: [phone]  [phone] - [place], [place] - [address] [place] [address] Phone: [phone] Fax: [phone]  [facility] [address] [place] - [address] [place] [address] Phone: [phone] Fax: [phone]     Social Drivers of Health (SDOH) Social History: SDOH Screenings   Food Insecurity: No Food Insecurity (09/08/2023)  Housing: Low Risk  (09/08/2023)  Transportation Needs: No Transportation Needs (09/08/2023)  Utilities: Not At Risk (09/08/2023)  Alcohol Screen: Low Risk  (04/30/2023)  Depression (PHQ2-9): Low Risk  (05/02/2023)  Financial Resource Strain: Low Risk  (04/30/2023)  Physical Activity: Sufficiently Active (04/30/2023)  Social Connections: Moderately Isolated (04/30/2023)  Stress: No Stress Concern Present (04/30/2023)  Tobacco Use: Medium Risk (09/08/2023)  Health Literacy: Adequate Health Literacy (04/30/2023)   SDOH Interventions:     Readmission Risk Interventions     No data to display

## 2023-09-09 NOTE — Progress Notes (Signed)
PROGRESS NOTE    Lisa [name]  ZOX:[id number] DOB: October 26, 1940 DOA: 09/07/2023 PCP: Lisa Quint, MD  Subjective: Pt seen and examined. HR much improved on lopressor 50 mg bid. Cardiology consulted per her request. Pt has lots of questions BP still up but she is still complaining about her right knee pain. Scr down to 1.23. BUN down to 22. Pt off losartan and hydrochlorothiazide.  Her telmetry reviewed. She is partially paced with HR right around 60 bpm.  Echo tech came to bedside to perform echo.  I think pt is stable for DC. Awaiting cardiology consult that pt requested.   Hospital Course: HPI: Lisa [name] is a 82 y.o. 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    Over the last day, the patient has experienced 2 episodes of palpitations followed by development of sharp nonradiating left-sided chest discomfort associated with shortness of breath, dizziness, without ensuing loss of consciousness without fall.  These episodes occur while at rest, and have both resolved spontaneously within 1 to 2 minutes of onset, responding with resolution of palpitations, in the absence of any interval nitroglycerin.  She denies any residual or new chest pain in between these episodes of palpitations.  Denies any recent subjective fever, chills or rigors, generalized myalgias.  Recent cough or trauma.   She has a history of paroxysmal atrial fibrillation, accompanied by sick sinus syndrome, complete heart block, for which she underwent pacemaker placement on 12/04/2021 in the [facility] health system.  She  follows with Dr. Swaz[place] as her outpatient cardiologist, and notes good compliance with chronic anticoagulation on home Eliquis.  In terms of outpatient AV nodal blocking regimen, she is on Lopressor 25 mg p.o. twice daily.   Most recent echocardiogram occurred in March 2023 and was notable for LVEF 66 5%, no evidence of focal motion abnormalities, grade 3 diastolic dysfunction with normal right ventricular systolic function, moderately dilated left atrium, mildly dilated right atrium and no evidence of significant valvular pathology.   She confirms that she is currently chest pain-free, and that she took a full dose aspirin x 1 at home prior to presenting to the emergency department this evening for       ED Course:  Vital signs in the ED were notable for the following: Afebrile; heart rates in the 60s to 70s; systolic blood pressure in the 160s 20s; respiratory rate 16-18, oxygen saturation 97 today 100% on room air.   Labs were notable for the following: BMP, which was notable for sodium 135 potassium 3.8, creatinine 1.33 compared to 1.25 on 7-24.  High-sensitivity troponin I initially 15, with repeat value trending up at 23.  CBC notable for with a cell count 12,800, hemoglobin 11.5 compared to 12.2 on 7-24, platelet count 301.  Urinalysis notable for noted blood cells, leukocyte esterase/nitrate negative.   Per my interpretation, EKG in ED demonstrated the following: Atrial paced rhythm, noting the presence of pacer spikes, left bundle branch block, heart rate 61 no evidence of T wave or ST changes, including no evidence of ST elevation.   Imaging in the ED, per corresponding formal radiology read, was notable for the following: 1 view chest x-ray shows  no evidence of acute cardiopulmonary process, including no evidence of infiltrate, edema, effusion, or pneumothorax.   While in the ED, the following were administered: None.   Subsequently, the patient was admitted further evaluation  management of atypical chest pain.   Significant Events: Admitted 09/07/2023 for atypical CP   Significant Labs: Admission sodium 135 potassium 3.8, creatinine 1.33 compared to 1.25 on 7-24. High-sensitivity troponin I initially 15, with repeat value trending up at 23. CBC notable for with a cell count 12,800, hemoglobin 11.5 compared to 12.2 on 7-24, platelet count 301. Urinalysis notable for noted blood cells, leukocyte esterase/nitrate negative.   Significant Imaging Studies: Admission chest x-ray shows no evidence of acute cardiopulmonary process, including no evidence of infiltrate, edema, effusion, or pneumothorax   Antibiotic Therapy: Anti-infectives (From admission, onward)    None       Procedures:   Consultants:     Assessment and Plan: * Atypical chest pain 09-08-2023 unclear the cause of her CP. I suspect it was from rapid afib. Will have cards look at her pacemaker interrogation. She may need another pacer interrogation to verify her rapid afib this AM.  09-09-2023 resolved. Discussed that mild elevation in her troponin are likely result of her rapid afib. Advised her that she is not having an AMI.  Paroxysmal atrial fibrillation (HCC) 09-08-2023 was in rapid afib this AM. Will increase lopressor to 50 mg bid. Verified that her PPM set for lowest HR of 60 bpm. Continue with eliquis. Hold losartan and hydrochlorothiazide to give BP more room for up titration of lopressor.  09-09-2023 HR improved on lopressor 50 mg bid. On Eliquis. Cardiology consulted per her and husband request. Echo shows LVEF 50%.  AKI (acute kidney injury) (HCC) 09-08-2023 mild AKI with Scr 1.33. have stopped losartan and hydrochlorothiazide for now. Repeat BMP in AM.  09-08-2023 Scr down to 1.23. have stopped hydrochlorothiazide and diovan. Do not restart at discharge.  Elevated troponin I level 09-08-2023 not ACS. Maybe tachycardia related. Trending troponin.  Leukocytosis 09-08-2023  doubt infection. Likely due to stress from palpitations.  Elevated troponin 09-08-2023 likely due to rapid afib.  Palpitations 09-08-2023 likely her rapid afib from yesterday.  09-09-2023 resolved.  Chronic diastolic CHF (congestive heart failure) (HCC) 09-08-2023 euvolemic/mild hypovolemic. Hold off on IVF for now.  Essential hypertension 09-08-2023 have to stop losartan and hydrochlorothiazide due to AKI. Up titration of lopressor for better HR control.  09-09-2023 HR under control with lopressor 50 mg bid. Will add hydralazine 25 mg tid. Pt has reported allergy to norvasc that causes edema.  GERD (gastroesophageal reflux disease) 09-08-2023 continue protonix. Change to po.  09-09-2023 pt continues to c/o of GERD. Will increase protonix to bid.       DVT prophylaxis: SCDs Start: 09/08/23 0233 apixaban (ELIQUIS) tablet 5 mg     Code Status: Full Code Family Communication: no family at bedside. Pt is decisional Disposition Plan: return home Reason for continuing need for hospitalization: medically stable for DC.  Objective: Vitals:   09/09/23 0033 09/09/23 0321 09/09/23 0426 09/09/23 0725  BP: 138/76  (!) 161/72 (!) 162/74  Pulse: 61  (!) 53 (!) 59  Resp: 19 17 20 17   Temp: 98.4 F (36.9 C)  97.9 F (36.6 C) 98.2 F (36.8 C)  TempSrc: Oral  Oral Oral  SpO2: 97%  91% 97%  Weight:  69.9 kg    Height:        Intake/Output Summary (Last 24 hours) at 09/09/2023 1044 Last data  filed at 09/09/2023 0812 Gross per 24 hour  Intake 840 ml  Output --  Net 840 ml   Filed Weights   09/07/23 1913 09/08/23 0356 09/09/23 0321  Weight: 74.8 kg 71.5 kg 69.9 kg    Examination:  Physical Exam Vitals and nursing note reviewed.  HENT:     Head: Normocephalic and atraumatic.     Nose: Nose normal.  Cardiovascular:     Rate and Rhythm: Normal rate. Rhythm irregular.     Pulses: Normal pulses.  Pulmonary:     Effort: Pulmonary effort is normal.     Breath sounds:  Normal breath sounds.  Abdominal:     General: Abdomen is flat. Bowel sounds are normal.     Palpations: Abdomen is soft.  Musculoskeletal:     Right lower leg: No edema.     Left lower leg: No edema.  Skin:    General: Skin is warm and dry.     Capillary Refill: Capillary refill takes less than 2 seconds.  Neurological:     Mental Status: She is alert and oriented to person, place, and time.     Data Reviewed: I have personally reviewed following labs and imaging studies  CBC: Recent Labs  Lab 09/07/23 1924 09/08/23 1033  WBC 12.8* 11.6*  NEUTROABS  --  7.4  HGB 11.5* 12.8  HCT 34.3* 38.8  MCV 79.6* 80.0  PLT 301 322   Basic Metabolic Panel: Recent Labs  Lab 09/07/23 1924 09/08/23 1033  NA 135 138  K 3.8 4.7  CL 102 106  CO2 22 23  GLUCOSE 108* 98  BUN 28* 22  CREATININE 1.33* 1.23*  CALCIUM 9.7 10.2  MG  --  1.7   GFR: Estimated Creatinine Clearance: 35.2 mL/min (A) (by C-G formula based on SCr of 1.23 mg/dL (H)). Liver Function Tests: Recent Labs  Lab 09/08/23 1033  AST 21  ALT 14  ALKPHOS 61  BILITOT 0.8  PROT 6.9  ALBUMIN 3.7   Recent Labs  Lab 09/08/23 1033  LIPASE 38    BNP (last 3 results) Recent Labs    09/07/23 1923  BNP 157.3*   CBG: Recent Labs  Lab 09/07/23 1924  GLUCAP 99   Thyroid Function Tests: Recent Labs    09/08/23 1033  TSH 2.201   Sepsis Labs: Recent Labs  Lab 09/08/23 1033  PROCALCITON <0.10    Radiology Studies: ECHOCARDIOGRAM COMPLETE Result Date: 09/09/2023    ECHOCARDIOGRAM REPORT   Patient Name:   Lisa [name] Date of Exam: 09/09/2023 Medical Rec #:  [id number]       Height:       65.0 in Accession #:    [id number]      Weight:       154.1 lb Date of Birth:  Dec 18, 1940        BSA:          1.771 m Patient Age:    82 years        BP:           162/74 mmHg Patient Gender: F               HR:           64 bpm. Exam Location:  Inpatient Procedure: 2D Echo, Cardiac Doppler and Color Doppler Indications:     Chest Pain R07.9  History:        Patient has prior history of Echocardiogram examinations, most  recent 11/30/2021. Arrythmias:Atrial Fibrillation; Risk                 Factors:Hypertension.  Sonographer:    Darlys Gales Referring Phys: [id number] JUSTIN B HOWERTER IMPRESSIONS  1. Left ventricular ejection fraction, by estimation, is 50%. The left ventricle has mildly decreased function. The left ventricle demonstrates global hypokinesis with septal-lateral dyssynchrony consistent with LBBB. There is mild concentric left ventricular hypertrophy. Left ventricular diastolic parameters are consistent with Grade II diastolic dysfunction (pseudonormalization).  2. Right ventricular systolic function is mildly reduced. The right ventricular size is mildly enlarged. There is normal pulmonary artery systolic pressure. The estimated right ventricular systolic pressure is 25.1 mmHg.  3. Left atrial size was moderately dilated.  4. The mitral valve is degenerative. No evidence of mitral valve regurgitation. No evidence of mitral stenosis. The mean mitral valve gradient is 2.5 mmHg. Moderate mitral annular calcification.  5. The aortic valve is tricuspid. There is mild calcification of the aortic valve. Aortic valve regurgitation is not visualized. No aortic stenosis is present.  6. The inferior vena cava is normal in size with greater than 50% respiratory variability, suggesting right atrial pressure of 3 mmHg.  7. A small pericardial effusion is present. FINDINGS  Left Ventricle: Left ventricular ejection fraction, by estimation, is 50%. The left ventricle has mildly decreased function. The left ventricle demonstrates global hypokinesis. The left ventricular internal cavity size was normal in size. There is mild concentric left ventricular hypertrophy. Left ventricular diastolic parameters are consistent with Grade II diastolic dysfunction (pseudonormalization). Right Ventricle: The right ventricular size is  mildly enlarged. No increase in right ventricular wall thickness. Right ventricular systolic function is mildly reduced. There is normal pulmonary artery systolic pressure. The tricuspid regurgitant velocity  is 2.35 m/s, and with an assumed right atrial pressure of 3 mmHg, the estimated right ventricular systolic pressure is 25.1 mmHg. Left Atrium: Left atrial size was moderately dilated. Right Atrium: Right atrial size was normal in size. Pericardium: A small pericardial effusion is present. Mitral Valve: The mitral valve is degenerative in appearance. Moderate mitral annular calcification. No evidence of mitral valve regurgitation. No evidence of mitral valve stenosis. MV peak gradient, 4.9 mmHg. The mean mitral valve gradient is 2.5 mmHg. Tricuspid Valve: The tricuspid valve is normal in structure. Tricuspid valve regurgitation is trivial. Aortic Valve: The aortic valve is tricuspid. There is mild calcification of the aortic valve. Aortic valve regurgitation is not visualized. No aortic stenosis is present. Aortic valve mean gradient measures 4.0 mmHg. Aortic valve peak gradient measures 7.2 mmHg. Aortic valve area, by VTI measures 2.81 cm. Pulmonic Valve: The pulmonic valve was normal in structure. Pulmonic valve regurgitation is not visualized. Aorta: The aortic root is normal in size and structure. Venous: The inferior vena cava is normal in size with greater than 50% respiratory variability, suggesting right atrial pressure of 3 mmHg. IAS/Shunts: No atrial level shunt detected by color flow Doppler. Additional Comments: A device lead is visualized in the right ventricle.  LEFT VENTRICLE PLAX 2D LVIDd:         4.30 cm   Diastology LVIDs:         3.30 cm   LV e' medial:    5.66 cm/s LV PW:         1.00 cm   LV E/e' medial:  15.8 LV IVS:        1.00 cm   LV e' lateral:   7.51 cm/s LVOT diam:  2.00 cm   LV E/e' lateral: 11.9 LV SV:         82 LV SV Index:   46 LVOT Area:     3.14 cm  RIGHT VENTRICLE RV S  prime:     9.90 cm/s TAPSE (M-mode): 2.2 cm LEFT ATRIUM              Index        RIGHT ATRIUM           Index LA Vol (A2C):   104.0 ml 58.74 ml/m  RA Area:     11.00 cm LA Vol (A4C):   56.0 ml  31.63 ml/m  RA Volume:   21.50 ml  12.14 ml/m LA Biplane Vol: 84.6 ml  47.78 ml/m  AORTIC VALVE AV Area (Vmax):    2.30 cm AV Area (Vmean):   2.19 cm AV Area (VTI):     2.81 cm AV Vmax:           134.00 cm/s AV Vmean:          94.000 cm/s AV VTI:            0.293 m AV Peak Grad:      7.2 mmHg AV Mean Grad:      4.0 mmHg LVOT Vmax:         98.20 cm/s LVOT Vmean:        65.600 cm/s LVOT VTI:          0.262 m LVOT/AV VTI ratio: 0.89  AORTA Ao Root diam: 2.60 cm Ao Asc diam:  3.30 cm MITRAL VALVE               TRICUSPID VALVE MV Area (PHT): 3.87 cm    TR Peak grad:   22.1 mmHg MV Area VTI:   2.17 cm    TR Vmax:        235.00 cm/s MV Peak grad:  4.9 mmHg MV Mean grad:  2.5 mmHg    SHUNTS MV Vmax:       1.10 m/s    Systemic VTI:  0.26 m MV Vmean:      75.9 cm/s   Systemic Diam: 2.00 cm MV Decel Time: 196 msec MV E velocity: 89.60 cm/s MV A velocity: 84.90 cm/s MV E/A ratio:  1.06 Dalton McleanMD Electronically signed by Wilfred Lacy Signature Date/Time: 09/09/2023/10:25:17 AM    Final    DG Chest Portable 1 View Result Date: 09/08/2023 CLINICAL DATA:  Encounter for pacemaker EXAM: PORTABLE CHEST 1 VIEW COMPARISON:  Chest x-ray 12/05/2021 FINDINGS: Left-sided pacemaker is unchanged in position. The heart is mildly enlarged, unchanged. The lungs are clear. There is no pleural effusion or pneumothorax. No acute osseous abnormality. IMPRESSION: 1. No active disease. 2. Mild cardiomegaly. Electronically Signed   By: Darliss Cheney M.D.   On: 09/08/2023 02:42    Scheduled Meds:  apixaban  5 mg Oral BID   hydrALAZINE  25 mg Oral Q8H   metoprolol tartrate  50 mg Oral BID   pantoprazole  40 mg Oral BID   Continuous Infusions:   LOS: 0 days   Time spent: 40 minutes  Carollee Herter, DO  [facility]  09/09/2023, 10:44 AM

## 2023-09-09 NOTE — Discharge Summary (Signed)
[facility] Phys[facility] Discharge Summary   Patient name: Lisa [name]  Admit date:     09/07/2023  Discharge date: 09/09/2023  Attending Physician: Angie Fava [[id number]]  Discharge Physician: Carollee Herter   PCP: Georgina Quint, MD  Admitted From: Home  Disposition:  Home  Recommendations for Outpatient Follow-up:  Follow up with PCP in 1-2 weeks Follow up with cardiology as scheduled by their office Please follow up on the following pending results: pt will need repeat BMP in PCP followup office visit to f/u on AKI.  Home Health:No Equipment/Devices: None    Discharge Condition:Stable CODE STATUS:FULL Diet recommendation: Heart Healthy Fluid Restriction: None  Hospital Summary: HPI: Lisa [name] is a 82 y.o. 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    Over the last day, the patient has experienced 2 episodes of palpitations followed by development of sharp nonradiating left-sided chest discomfort associated with shortness of breath, dizziness, without ensuing loss of consciousness without fall.  These episodes occur while at rest, and have both resolved spontaneously within 1 to 2 minutes of onset, responding with resolution of palpitations, in the absence of any interval nitroglycerin.  She denies any residual or new chest pain in between these episodes of palpitations.  Denies any recent subjective fever, chills or rigors, generalized myalgias.  Recent cough or trauma.   She has a history of paroxysmal atrial fibrillation, accompanied by sick sinus syndrome,  complete heart block, for which she underwent pacemaker placement on 12/04/2021 in the [facility] health system.  She follows with Dr. [place] as her outpatient cardiologist, and notes good compliance with chronic anticoagulation on home Eliquis.  In terms of outpatient AV nodal blocking regimen, she is on Lopressor 25 mg p.o. twice daily.   Most recent echocardiogram occurred in March 2023 and was notable for LVEF 66 5%, no evidence of focal motion abnormalities, grade 3 diastolic dysfunction with normal right ventricular systolic function, moderately dilated left atrium, mildly dilated right atrium and no evidence of significant valvular pathology.   She confirms that she is currently chest pain-free, and that she took a full dose aspirin x 1 at home prior to presenting to the emergency department this evening for       ED Course:  Vital signs in the ED were notable for the following: Afebrile; heart rates in the 60s to 70s; systolic blood pressure in the 160s 20s; respiratory rate 16-18, oxygen saturation 97 today 100% on room air.   Labs were notable for the following: BMP, which was notable for sodium 135 potassium 3.8, creatinine 1.33 compared to 1.25 on 7-24.  High-sensitivity troponin I initially 15, with repeat value trending up at 23.  CBC notable for with a cell count 12,800, hemoglobin 11.5 compared to 12.2 on 7-24, platelet count 301.  Urinalysis notable for noted blood cells, leukocyte esterase/nitrate negative.   Per my interpretation, EKG in ED demonstrated the following: Atrial paced rhythm, noting the presence of pacer spikes, left bundle branch block, heart rate 61 no evidence of T wave or ST changes, including no evidence of ST elevation.   Imaging in the ED, per corresponding formal radiology read, was notable for  the following: 1 view chest x-ray shows no evidence of acute cardiopulmonary process, including no evidence of infiltrate, edema, effusion, or pneumothorax.   While in the ED,  the following were administered: None.   Subsequently, the patient was admitted further evaluation management of atypical chest pain.   Significant Events: Admitted 09/07/2023 for atypical CP   Significant Labs: Admission sodium 135 potassium 3.8, creatinine 1.33 compared to 1.25 on 7-24. High-sensitivity troponin I initially 15, with repeat value trending up at 23. CBC notable for with a cell count 12,800, hemoglobin 11.5 compared to 12.2 on 7-24, platelet count 301. Urinalysis notable for noted blood cells, leukocyte esterase/nitrate negative.   Significant Imaging Studies: Admission chest x-ray shows no evidence of acute cardiopulmonary process, including no evidence of infiltrate, edema, effusion, or pneumothorax   Antibiotic Therapy: Anti-infectives (From admission, onward)    None       Procedures:   Consultants:    Hospital Course by Problem: * Atypical chest pain 09-08-2023 unclear the cause of her CP. I suspect it was from rapid afib. Will have cards look at her pacemaker interrogation. She may need another pacer interrogation to verify her rapid afib this AM.  09-09-2023 resolved. Discussed that mild elevation in her troponin are likely result of her rapid afib. Advised her that she is not having an AMI.  Paroxysmal atrial fibrillation (HCC) 09-08-2023 was in rapid afib this AM. Will increase lopressor to 50 mg bid. Verified that her PPM set for lowest HR of 60 bpm. Continue with eliquis. Hold losartan and hydrochlorothiazide to give BP more room for up titration of lopressor.  09-09-2023 HR improved on lopressor 50 mg bid. On Eliquis. Cardiology consulted per her and husband request. Echo shows LVEF 50%.  AKI (acute kidney injury) (HCC) 09-08-2023 mild AKI with Scr 1.33. have stopped losartan and hydrochlorothiazide for now. Repeat BMP in AM.  09-08-2023 Scr down to 1.23. have stopped hydrochlorothiazide and diovan. Do not restart at discharge.  *At time of  discharge, I was specifically instructed to continue pt's hydrochlorothiazide 12.5 mg qday and losartan 100 mg qday by Dr. Tenny Craw.  Elevated troponin I level 09-08-2023 not ACS. Maybe tachycardia related. Trending troponin.  Leukocytosis 09-08-2023 doubt infection. Likely due to stress from palpitations.  Elevated troponin 09-08-2023 likely due to rapid afib.  Palpitations 09-08-2023 likely her rapid afib from yesterday.  09-09-2023 resolved.  Chronic diastolic CHF (congestive heart failure) (HCC) 09-08-2023 euvolemic/mild hypovolemic. Hold off on IVF for now.  Essential hypertension 09-08-2023 have to stop losartan and hydrochlorothiazide due to AKI. Up titration of lopressor for better HR control.  09-09-2023 HR under control with lopressor 50 mg bid. Will add hydralazine 25 mg tid. Pt has reported allergy to norvasc that causes edema.  GERD (gastroesophageal reflux disease) 09-08-2023 continue protonix. Change to po.  09-09-2023 pt continues to c/o of GERD. Will increase protonix to bid.    Discharge Diagnoses:  Principal Problem:   Atypical chest pain Active Problems:   Paroxysmal atrial fibrillation (HCC)   Palpitations   Elevated troponin   Leukocytosis   Elevated troponin I level   AKI (acute kidney injury) (HCC)   GERD (gastroesophageal reflux disease)   Essential hypertension   Chronic diastolic CHF (congestive heart failure) [facility])   Discharge Instructions  Discharge Instructions     Call MD for:  extreme fatigue   Complete by: As directed    Call MD for:  persistant dizziness or light-headedness   Complete by: As directed  Call MD for:  severe uncontrolled pain   Complete by: As directed    Call MD for:  temperature >100.4   Complete by: As directed    Diet - low sodium heart healthy   Complete by: As directed    Discharge instructions   Complete by: As directed    1. Follow up with your primary care provider in 1-2 week following discharge from  hospital 2. Cardiology office will call you with a follow up appointment.   Increase activity slowly   Complete by: As directed       Allergies as of 09/09/2023       Reactions   Codeine Other (See Comments), Nausea And Vomiting   GI upset Other reaction(s): stomach upset   Amlodipine Swelling    edema   Fosamax [alendronate Sodium] Other (See Comments)   Tooth problems   Lisinopril Cough   Olmesartan Other (See Comments)   Possible photodermatitis        Medication List     TAKE these medications    Acetaminophen 8 Hour 650 MG CR tablet Generic drug: acetaminophen Take 650 mg by mouth as needed for pain (right knee pain).   Eliquis 5 MG Tabs tablet Generic drug: apixaban TAKE 1 TABLET BY MOUTH TWICE DAILY   hydrochlorothiazide 12.5 MG tablet Commonly known as: HYDRODIURIL Take 1 tablet (12.5 mg total) by mouth daily.   losartan 100 MG tablet Commonly known as: COZAAR Take 1 tablet (100 mg total) by mouth daily.   metoprolol tartrate 50 MG tablet Commonly known as: LOPRESSOR Take 1 tablet (50 mg total) by mouth 2 (two) times daily. What changed:  medication strength See the new instructions.   pantoprazole 40 MG tablet Commonly known as: PROTONIX Take 1 tablet (40 mg total) by mouth 2 (two) times daily. What changed:  medication strength how much to take when to take this reasons to take this   Vitamin D3 50 MCG (2000 UT) Tabs Take 2,000 Units by mouth daily.        Follow-up Information     Corrin Parker, PA-C Follow up.   Specialty: Cardiology Why: Hospital follow-up with General Cardiology scheduled for 10/02/2023 at 10:05am at our [facility] office. Please arrive 15 minutes early for check-in. If this date/times does not work for you, please call our office to reschedule. Contact information: [address] [place] [address] [phone]         Jeanella Craze, NP Follow up.   Specialty: Cardiology Why: Hospital  follow-up with EP scheduled for 09/24/2023 at 8:30am at our [facility] office. Please arrive 15 mintues early for check-in. If this date/ time does not work for you, please call our office to reschedule. Contact information: [address] [place] [address] [phone]                Allergies  Allergen Reactions   Codeine Other (See Comments) and Nausea And Vomiting    GI upset Other reaction(s): stomach upset   Amlodipine Swelling     edema   Fosamax [Alendronate Sodium] Other (See Comments)    Tooth problems   Lisinopril Cough   Olmesartan Other (See Comments)    Possible photodermatitis     Discharge Exam: Vitals:   09/09/23 0725 09/09/23 1051  BP: (!) 162/74 (!) 150/73  Pulse: (!) 59 60  Resp: 17 20  Temp: 98.2 F (36.8 C) 98.7 F (37.1 C)  SpO2: 97% 97%  Physical Exam Vitals and nursing note reviewed.  HENT:     Head: Normocephalic and atraumatic.     Nose: Nose normal.  Cardiovascular:     Rate and Rhythm: Normal rate. Rhythm irregular.     Pulses: Normal pulses.  Pulmonary:     Effort: Pulmonary effort is normal.     Breath sounds: Normal breath sounds.  Abdominal:     General: Abdomen is flat. Bowel sounds are normal.     Palpations: Abdomen is soft.  Musculoskeletal:     Right lower leg: No edema.     Left lower leg: No edema.  Skin:    General: Skin is warm and dry.     Capillary Refill: Capillary refill takes less than 2 seconds.  Neurological:     Mental Status: She is alert and oriented to person, place, and time.     The results of significant diagnostics from this hospitalization (including imaging, microbiology, ancillary and laboratory) are listed below for reference.     Labs: BNP (last 3 results) Recent Labs    09/07/23 1923  BNP 157.3*   Basic Metabolic Panel: Recent Labs  Lab 09/07/23 1924 09/08/23 1033  NA 135 138  K 3.8 4.7  CL 102 106  CO2 22 23  GLUCOSE 108* 98  BUN 28* 22  CREATININE  1.33* 1.23*  CALCIUM 9.7 10.2  MG  --  1.7   Liver Function Tests: Recent Labs  Lab 09/08/23 1033  AST 21  ALT 14  ALKPHOS 61  BILITOT 0.8  PROT 6.9  ALBUMIN 3.7   Recent Labs  Lab 09/08/23 1033  LIPASE 38   No results for input(s): "AMMONIA" in the last 168 hours. CBC: Recent Labs  Lab 09/07/23 1924 09/08/23 1033  WBC 12.8* 11.6*  NEUTROABS  --  7.4  HGB 11.5* 12.8  HCT 34.3* 38.8  MCV 79.6* 80.0  PLT 301 322   BNP: Recent Labs  Lab 09/07/23 1923  BNP 157.3*   CBG: Recent Labs  Lab 09/07/23 1924  GLUCAP 99   Thyroid function studies Recent Labs    09/08/23 1033  TSH 2.201   Urinalysis    Component Value Date/Time   COLORURINE STRAW (A) 09/07/2023 2134   APPEARANCEUR CLEAR 09/07/2023 2134   LABSPEC 1.009 09/07/2023 2134   PHURINE 6.0 09/07/2023 2134   GLUCOSEU NEGATIVE 09/07/2023 2134   GLUCOSEU NEGATIVE 05/02/2023 1540   HGBUR SMALL (A) 09/07/2023 2134   BILIRUBINUR NEGATIVE 09/07/2023 2134   BILIRUBINUR negative 05/02/2023 1523   KETONESUR NEGATIVE 09/07/2023 2134   PROTEINUR NEGATIVE 09/07/2023 2134   UROBILINOGEN 0.2 05/02/2023 1540   UROBILINOGEN 0.2 05/02/2023 1523   NITRITE NEGATIVE 09/07/2023 2134   LEUKOCYTESUR NEGATIVE 09/07/2023 2134   Sepsis Labs Recent Labs  Lab 09/07/23 1924 09/08/23 1033  WBC 12.8* 11.6*    Procedures/Studies: ECHOCARDIOGRAM COMPLETE Result Date: 09/09/2023    ECHOCARDIOGRAM REPORT   Patient Name:   GIZZEL HOFFMEISTER Date of Exam: 09/09/2023 Medical Rec #:  [id number]       Height:       65.0 in Accession #:    [id number]      Weight:       154.1 lb Date of Birth:  [birth year]        BSA:          1.771 m Patient Age:    82 years        BP:  162/74 mmHg Patient Gender: F               HR:           64 bpm. Exam Location:  Inpatient Procedure: 2D Echo, Cardiac Doppler and Color Doppler Indications:    Chest Pain R07.9  History:        Patient has prior history of Echocardiogram examinations, most                  recent 11/30/2021. Arrythmias:Atrial Fibrillation; Risk                 Factors:Hypertension.  Sonographer:    Darlys Gales Referring Phys: [id number] JUSTIN B HOWERTER IMPRESSIONS  1. Left ventricular ejection fraction, by estimation, is 50%. The left ventricle has mildly decreased function. The left ventricle demonstrates global hypokinesis with septal-lateral dyssynchrony consistent with LBBB. There is mild concentric left ventricular hypertrophy. Left ventricular diastolic parameters are consistent with Grade II diastolic dysfunction (pseudonormalization).  2. Right ventricular systolic function is mildly reduced. The right ventricular size is mildly enlarged. There is normal pulmonary artery systolic pressure. The estimated right ventricular systolic pressure is 25.1 mmHg.  3. Left atrial size was moderately dilated.  4. The mitral valve is degenerative. No evidence of mitral valve regurgitation. No evidence of mitral stenosis. The mean mitral valve gradient is 2.5 mmHg. Moderate mitral annular calcification.  5. The aortic valve is tricuspid. There is mild calcification of the aortic valve. Aortic valve regurgitation is not visualized. No aortic stenosis is present.  6. The inferior vena cava is normal in size with greater than 50% respiratory variability, suggesting right atrial pressure of 3 mmHg.  7. A small pericardial effusion is present. FINDINGS  Left Ventricle: Left ventricular ejection fraction, by estimation, is 50%. The left ventricle has mildly decreased function. The left ventricle demonstrates global hypokinesis. The left ventricular internal cavity size was normal in size. There is mild concentric left ventricular hypertrophy. Left ventricular diastolic parameters are consistent with Grade II diastolic dysfunction (pseudonormalization). Right Ventricle: The right ventricular size is mildly enlarged. No increase in right ventricular wall thickness. Right ventricular systolic function is  mildly reduced. There is normal pulmonary artery systolic pressure. The tricuspid regurgitant velocity  is 2.35 m/s, and with an assumed right atrial pressure of 3 mmHg, the estimated right ventricular systolic pressure is 25.1 mmHg. Left Atrium: Left atrial size was moderately dilated. Right Atrium: Right atrial size was normal in size. Pericardium: A small pericardial effusion is present. Mitral Valve: The mitral valve is degenerative in appearance. Moderate mitral annular calcification. No evidence of mitral valve regurgitation. No evidence of mitral valve stenosis. MV peak gradient, 4.9 mmHg. The mean mitral valve gradient is 2.5 mmHg. Tricuspid Valve: The tricuspid valve is normal in structure. Tricuspid valve regurgitation is trivial. Aortic Valve: The aortic valve is tricuspid. There is mild calcification of the aortic valve. Aortic valve regurgitation is not visualized. No aortic stenosis is present. Aortic valve mean gradient measures 4.0 mmHg. Aortic valve peak gradient measures 7.2 mmHg. Aortic valve area, by VTI measures 2.81 cm. Pulmonic Valve: The pulmonic valve was normal in structure. Pulmonic valve regurgitation is not visualized. Aorta: The aortic root is normal in size and structure. Venous: The inferior vena cava is normal in size with greater than 50% respiratory variability, suggesting right atrial pressure of 3 mmHg. IAS/Shunts: No atrial level shunt detected by color flow Doppler. Additional Comments: A device lead is visualized in the right ventricle.  LEFT VENTRICLE PLAX 2D LVIDd:         4.30 cm   Diastology LVIDs:         3.30 cm   LV e' medial:    5.66 cm/s LV PW:         1.00 cm   LV E/e' medial:  15.8 LV IVS:        1.00 cm   LV e' lateral:   7.51 cm/s LVOT diam:     2.00 cm   LV E/e' lateral: 11.9 LV SV:         82 LV SV Index:   46 LVOT Area:     3.14 cm  RIGHT VENTRICLE RV S prime:     9.90 cm/s TAPSE (M-mode): 2.2 cm LEFT ATRIUM              Index        RIGHT ATRIUM            Index LA Vol (A2C):   104.0 ml 58.74 ml/m  RA Area:     11.00 cm LA Vol (A4C):   56.0 ml  31.63 ml/m  RA Volume:   21.50 ml  12.14 ml/m LA Biplane Vol: 84.6 ml  47.78 ml/m  AORTIC VALVE AV Area (Vmax):    2.30 cm AV Area (Vmean):   2.19 cm AV Area (VTI):     2.81 cm AV Vmax:           134.00 cm/s AV Vmean:          94.000 cm/s AV VTI:            0.293 m AV Peak Grad:      7.2 mmHg AV Mean Grad:      4.0 mmHg LVOT Vmax:         98.20 cm/s LVOT Vmean:        65.600 cm/s LVOT VTI:          0.262 m LVOT/AV VTI ratio: 0.89  AORTA Ao Root diam: 2.60 cm Ao Asc diam:  3.30 cm MITRAL VALVE               TRICUSPID VALVE MV Area (PHT): 3.87 cm    TR Peak grad:   22.1 mmHg MV Area VTI:   2.17 cm    TR Vmax:        235.00 cm/s MV Peak grad:  4.9 mmHg MV Mean grad:  2.5 mmHg    SHUNTS MV Vmax:       1.10 m/s    Systemic VTI:  0.26 m MV Vmean:      75.9 cm/s   Systemic Diam: 2.00 cm MV Decel Time: 196 msec MV E velocity: 89.60 cm/s MV A velocity: 84.90 cm/s MV E/A ratio:  1.06 Dalton McleanMD Electronically signed by Wilfred Lacy Signature Date/Time: 09/09/2023/10:25:17 AM    Final    DG Chest Portable 1 View Result Date: 09/08/2023 CLINICAL DATA:  Encounter for pacemaker EXAM: PORTABLE CHEST 1 VIEW COMPARISON:  Chest x-ray 12/05/2021 FINDINGS: Left-sided pacemaker is unchanged in position. The heart is mildly enlarged, unchanged. The lungs are clear. There is no pleural effusion or pneumothorax. No acute osseous abnormality. IMPRESSION: 1. No active disease. 2. Mild cardiomegaly. Electronically Signed   By: Darliss Cheney M.D.   On: 09/08/2023 02:42   CUP PACEART REMOTE DEVICE CHECK Result Date: 09/04/2023 Scheduled remote reviewed. Normal device function.  Next remote 91 days. LA, CVRS  MM 3D SCREENING  MAMMOGRAM BILATERAL BREAST Result Date: 08/28/2023 CLINICAL DATA:  Screening. EXAM: DIGITAL SCREENING BILATERAL MAMMOGRAM WITH TOMOSYNTHESIS AND CAD TECHNIQUE: Bilateral screening digital craniocaudal and  mediolateral oblique mammograms were obtained. Bilateral screening digital breast tomosynthesis was performed. The images were evaluated with computer-aided detection. COMPARISON:  Previous exam(s). ACR Breast Density Category a: The breasts are almost entirely fatty. FINDINGS: There are no findings suspicious for malignancy. IMPRESSION: No mammographic evidence of malignancy. A result letter of this screening mammogram will be mailed directly to the patient. RECOMMENDATION: Screening mammogram in one year. (Code:SM-B-01Y) BI-RADS CATEGORY  1: Negative. Electronically Signed   By: Baird Lyons M.D.   On: 08/28/2023 11:25    Time coordinating discharge: 45 mins  SIGNED:  Carollee Herter, DO [facility] 09/09/23, 4:29 PM

## 2023-09-10 ENCOUNTER — Telehealth: Payer: Self-pay | Admitting: Cardiology

## 2023-09-10 NOTE — Telephone Encounter (Signed)
Spoke to patient Dr.Jordan advised to increase hydrochlorothiazide to 25 mg daily.Advised to call back Mon 12/23 if B/P still elevated.

## 2023-09-10 NOTE — Telephone Encounter (Signed)
Spoke to patient she stated she was admitted to [facility] hospital this past Sat.for fast heart beat She was discharged yesterday 12/16.Stated she is very concerned B/P is still elevated at present 168/93 pulse 69.Advised I will send message to Dr.Jordan for advice.

## 2023-09-10 NOTE — Telephone Encounter (Signed)
Patient wants a call back directly from RN Cheryl. 

## 2023-09-16 ENCOUNTER — Telehealth: Payer: Self-pay

## 2023-09-16 NOTE — Progress Notes (Unsigned)
Cardiology Office Note:    Date:  09/19/2023   ID:  Lisa [name], DOB 1941-08-31, MRN [id number]  PCP:  Georgina Quint, MD [facility] Cardiologist: Michah Minton [place], MD   Reason for visit: Follow-up ED visit for HTN  History of Present Illness:    Lisa [name] is a 82 y.o. female with a hx of atrial fibrillation s/p DCCV in 07/2018 (sx SOB, swelling, indigestion), hypertension, hyperlipidemia, left bundle branch block.   Myoview in January 2019 was normal.    She was seen in ED at [facility] in Jan 2023. She had an episode of syncope at home. Following this she had acute N/V and diarrhea.  Ecg showed NSR with chronic LBBB. BP was quite high to 202/110. Labs including troponins were negative. She hasn't had any further dizziness or syncope but has persistent elevated BP.  She was on metoprolol, losartan and HCTZ.  When seen in Feb she was in NSR. Later presented in March with recurrent Afib. She underwent successful DCCV. 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 She was admitted and underwent placement of dual chamber pacemaker by Dr Ladona Ridgel. Echo was unremarkable. She was DC on metoprolol and Eliquis. Prior renal artery duplex was negative for RAS.   Pacemaker follow up in September was normal.   I saw her on Dec 3 and she was doing well. She was admitted overnight on 12/15 for palpitations. Pacer interrogation showed Afib with RVR. She converted. Had atypical chest pain. Minimal troponin leak metoprolol was increased to 50 mg bid and losartan and HCT held.   She is seen today as a work in. Notes she has nausea in the morning. BP has been erratic and more consistently high. Has intermittent sensation of her heart racing. Has a lot of belching.      Past Medical History:  Diagnosis Date   Bundle branch block, left    Dyslipidemia    GERD (gastroesophageal reflux disease)    Hypercholesteremia     Hypertension    LBBB (left bundle branch block)    Paroxysmal atrial fibrillation (HCC)    UTI (urinary tract infection)     Past Surgical History:  Procedure Laterality Date   bladder polyp removal     BREAST EXCISIONAL BIOPSY Right    60 yrs ago- Benign   BREAST LUMPECTOMY Left    No Visible Scar, said 30 + years ago in situ, no chemo, no radiation, or hormone replacement     CARDIOVERSION N/A 07/30/2018   Procedure: CARDIOVERSION;  Surgeon: Jake Bathe, MD;  Location: [facility] ENDOSCOPY;  Service: Cardiovascular;  Laterality: N/A;   CARDIOVERSION N/A 11/28/2021   Procedure: CARDIOVERSION;  Surgeon: Marinus Maw, MD;  Location: [facility] INVASIVE CV LAB;  Service: Cardiovascular;  Laterality: N/A;   CESAREAN SECTION     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   Procedure: PACEMAKER IMPLANT;  Surgeon: Marinus Maw, MD;  Location: [facility] INVASIVE CV LAB;  Service: Cardiovascular;  Laterality: N/A;    Current Medications: Current Meds  Medication Sig   acetaminophen (ACETAMINOPHEN 8 HOUR) 650 MG CR tablet Take 650 mg by mouth as needed for pain (right knee pain).   apixaban (ELIQUIS) 5 MG TABS tablet TAKE 1 TABLET BY MOUTH TWICE DAILY   Cholecalciferol (VITAMIN D3) 50 MCG (2000 UT) TABS Take 2,000 Units by mouth daily.   hydrALAZINE (APRESOLINE) 25 MG tablet Take 1 tablet (25  mg total) by mouth 3 (three) times daily.   losartan (COZAAR) 100 MG tablet Take 1 tablet (100 mg total) by mouth daily.   metoprolol tartrate (LOPRESSOR) 50 MG tablet Take 1 tablet (50 mg total) by mouth 2 (two) times daily.   ondansetron (ZOFRAN) 4 MG tablet Take 1 tablet (4 mg total) by mouth every 8 (eight) hours as needed for nausea or vomiting.   pantoprazole (PROTONIX) 40 MG tablet Take 1 tablet (40 mg total) by mouth 2 (two) times daily.   [DISCONTINUED] hydrochlorothiazide (HYDRODIURIL) 25 MG tablet Take 1 tablet (25 mg total) by mouth daily.     Allergies:   Codeine, Amlodipine, Fosamax [alendronate sodium],  Lisinopril, and Olmesartan   Social History   Socioeconomic History   Marital status: Married    Spouse name: Not on file   Number of children: 2   Years of education: Not on file   Highest education level: Associate degree: occupational, Scientist, product/process development, or vocational program  Occupational History   Not on file  Tobacco Use   Smoking status: Former    Current packs/day: 0.00    Types: Cigarettes    Start date: 45    Quit date: 1993    Years since quitting: 32.0   Smokeless tobacco: Never  Vaping Use   Vaping status: Never Used  Substance and Sexual Activity   Alcohol use: Yes   Drug use: No   Sexual activity: Not on file  Other Topics Concern   Not on file  Social History Narrative   Not on file   Social Drivers of Health   Financial Resource Strain: Low Risk  (04/30/2023)   Overall Financial Resource Strain (CARDIA)    Difficulty of Paying Living Expenses: Not hard at all  Food Insecurity: No Food Insecurity (09/08/2023)   Hunger Vital Sign    Worried About Running Out of Food in the Last Year: Never true    Ran Out of Food in the Last Year: Never true  Transportation Needs: No Transportation Needs (09/08/2023)   PRAPARE - Administrator, Civil Service (Medical): No    Lack of Transportation (Non-Medical): No  Physical Activity: Sufficiently Active (04/30/2023)   Exercise Vital Sign    Days of Exercise per Week: 5 days    Minutes of Exercise per Session: 60 min  Stress: No Stress Concern Present (04/30/2023)   [employer] of Occupational Health - Occupational Stress Questionnaire    Feeling of Stress : Not at all  Social Connections: Moderately Isolated (04/30/2023)   Social Connection and Isolation Panel [NHANES]    Frequency of Communication with Friends and Family: Twice a week    Frequency of Social Gatherings with Friends and Family: Once a week    Attends Religious Services: Never    Database administrator or Organizations: No    Attends Museum/gallery exhibitions officer: Never    Marital Status: Married     Family History: The patient's family history includes Breast cancer in her mother; CVA in her brother and brother; Crohn's disease in her brother; Diabetes in her brother; Hip fracture in her mother; Other in her brother; Renal cancer in her brother; Ulcers in her father.  ROS:   Please see the history of present illness.     EKGs/Labs/Other Studies Reviewed:    Recent Labs: 09/07/2023: B Natriuretic Peptide 157.3 09/08/2023: ALT 14; BUN 22; Creatinine, Ser 1.23; Hemoglobin 12.8; Magnesium 1.7; Platelets 322; Potassium 4.7; Sodium 138; TSH 2.201  Recent Lipid Panel Lab Results  Component Value Date/Time   CHOL 210 (H) 03/26/2023 09:04 AM   TRIG 123.0 03/26/2023 09:04 AM   HDL 52.40 03/26/2023 09:04 AM   LDLCALC 133 (H) 03/26/2023 09:04 AM    Dated 06/03/20: cholesterol 159, triglycerides 94, HDL 59, LDL 83. Dated 06/15/21: A1c 6.3, Hgb 12.5. chemistries and TSH normal. Dated 11/15/21:A1c 6.3%   Echo 11/30/21: IMPRESSIONS     1. Left ventricular ejection fraction, by estimation, is 60 to 65%. Left  ventricular ejection fraction by 3D volume is 66 %. The left ventricle has  normal function. The left ventricle has no regional wall motion  abnormalities. Left ventricular diastolic   parameters are consistent with Grade III diastolic dysfunction  (restrictive). Elevated left ventricular end-diastolic pressure.   2. Right ventricular systolic function is normal. The right ventricular  size is mildly enlarged. There is normal pulmonary artery systolic  pressure. The estimated right ventricular systolic pressure is 23.4 mmHg.   3. Left atrial size was moderately dilated.   4. Right atrial size was mildly dilated.   5. The mitral valve is normal in structure. No evidence of mitral valve  regurgitation. No evidence of mitral stenosis. Severe mitral annular  calcification.   6. The aortic valve is tricuspid. Aortic valve  regurgitation is not  visualized. Aortic valve sclerosis/calcification is present, without any  evidence of aortic stenosis.   7. Aortic dilatation noted. There is borderline dilatation of the  ascending aorta, measuring 37 mm.   8. The inferior vena cava is normal in size with greater than 50%  respiratory variability, suggesting right atrial pressure of 3 mmHg.   Physical Exam:    VS:  BP (!) 140/68   Pulse 65   Ht 5\' 5"  (1.651 m)   Wt 156 lb (70.8 kg)   SpO2 91%   BMI 25.96 kg/m    No data found.  Wt Readings from Last 3 Encounters:  09/19/23 156 lb (70.8 kg)  09/09/23 154 lb 1.6 oz (69.9 kg)  08/27/23 165 lb (74.8 kg)     GEN:  Well nourished, well developed in no acute distress HEENT: Normal NECK: No JVD; No carotid bruits CARDIAC: RRR, no murmurs, rubs, gallops RESPIRATORY:  Clear to auscultation without rales, wheezing or rhonchi  ABDOMEN: Soft, non-tender, non-distended MUSCULOSKELETAL: No edema; No deformity  SKIN: Warm and dry NEUROLOGIC:  Alert and oriented PSYCHIATRIC:  Normal affect     ASSESSMENT AND PLAN    HTN - Essential. BP is high today but has been more consistently elevated.  -will continue losartan 100 mg - continue metoprolol  50 mg bid - due to CKD will stop HCT - start hydralazine 25 mg tid and titrate as needed - asked her to report on BP readings in 2 weeks.  - Dash diet. - continue to monitor at home  2. Paroxysmal atrial fibrillation -Status post DCCV in November 2019 and again last year in Feb -Echo showed mild LV dysfunction with dyssynergy due to LBBB. Moderate LAE.  - had no Afib until Dec 15. Now in NSR -Continue Eliquis for stroke prevention and higher metoprolol dose  for rate control.   - last Echo showed normal LV function - if she does have more frequent Afib may need to consider AAD therapy  3. LBBB chronic.   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   5. HLD.  Intolerant to statins.  No known history of  vascular disease. Will focus on lifestyle modification for now.   6. GERD. Nausea. On PRotonix bid. Will give prn Zofran    Disposition - Follow-up in 2 months         Medication Adjustments/Labs and Tests Ordered: Current medicines are reviewed at length with the patient today.  Concerns regarding medicines are outlined above.  No orders of the defined types were placed in this encounter.  Meds ordered this encounter  Medications   hydrALAZINE (APRESOLINE) 25 MG tablet    Sig: Take 1 tablet (25 mg total) by mouth 3 (three) times daily.    Dispense:  270 tablet    Refill:  3   ondansetron (ZOFRAN) 4 MG tablet    Sig: Take 1 tablet (4 mg total) by mouth every 8 (eight) hours as needed for nausea or vomiting.    Dispense:  20 tablet    Refill:  0    There are no Patient Instructions on file for this visit.   Signed, Parilee Hally [place], MD  09/19/2023 2:45 PM    [facility]

## 2023-09-16 NOTE — Telephone Encounter (Signed)
Spoke to patient Dr.Jordan's advice given.Advised to monitor B/P and bring readings to appointment with Dr.Jordan 12/26 at 2:20 pm.

## 2023-09-16 NOTE — Telephone Encounter (Signed)
Received a call from patient she stated she feels bad, light headed and dizzy.B/P this morning 156/89,pulse 66.Stated Metoprolol was increased to 50 mg twice a day last week on discharge from [place].Stated she only took 25 mg this morning,she did not take any last night.She thought 50 mg was making her light headed.Advised to take Metoprolol 50 mg twice a day as prescribed.Appointment scheduled with Dr.Jordan 12/26 at 2:20 pm.I will send message to Dr.Jordan to see if he wants you to decrease Metoprolol back to 25 mg twice a day.

## 2023-09-19 ENCOUNTER — Ambulatory Visit: Payer: Medicare Other | Attending: Cardiology | Admitting: Cardiology

## 2023-09-19 ENCOUNTER — Encounter: Payer: Self-pay | Admitting: Cardiology

## 2023-09-19 VITALS — BP 140/68 | HR 65 | Ht 65.0 in | Wt 156.0 lb

## 2023-09-19 DIAGNOSIS — I480 Paroxysmal atrial fibrillation: Secondary | ICD-10-CM | POA: Diagnosis not present

## 2023-09-19 DIAGNOSIS — I10 Essential (primary) hypertension: Secondary | ICD-10-CM

## 2023-09-19 DIAGNOSIS — I442 Atrioventricular block, complete: Secondary | ICD-10-CM | POA: Diagnosis not present

## 2023-09-19 MED ORDER — ONDANSETRON HCL 4 MG PO TABS: 4.0000 mg | ORAL_TABLET | Freq: Three times a day (TID) | ORAL | 0 refills | Status: DC | PRN

## 2023-09-19 MED ORDER — HYDRALAZINE HCL 25 MG PO TABS: 25.0000 mg | ORAL_TABLET | Freq: Three times a day (TID) | ORAL | 3 refills | Status: DC

## 2023-09-19 NOTE — Patient Instructions (Signed)
Medication Instructions:  Stop hydrochlorothiazide  Start Hydralazine 25 mg three times a day Take Zofran 4 mg as needed for nausea Continue all other medications *If you need a refill on your cardiac medications before your next appointment, please call your pharmacy*   Lab Work: None ordered   Testing/Procedures: None ordered   Follow-Up: At [facility], you and your health needs are our priority.  As part of our continuing mission to provide you with exceptional heart care, we have created designated Provider Care Teams.  These Care Teams include your primary Cardiologist (physician) and Advanced Practice Providers (APPs -  Physician Assistants and Nurse Practitioners) who all work together to provide you with the care you need, when you need it.  We recommend signing up for the patient portal called "MyChart".  Sign up information is provided on this After Visit Summary.  MyChart is used to connect with patients for Virtual Visits (Telemedicine).  Patients are able to view lab/test results, encounter notes, upcoming appointments, etc.  Non-urgent messages can be sent to your provider as well.   To learn more about what you can do with MyChart, go to [email or web address].    Your next appointment:  2 months    Provider:  Dr.Jordan

## 2023-09-19 NOTE — Progress Notes (Signed)
 Electrophysiology Office Note:   Date:  09/24/2023  ID:  Lisa [name], DOB 04/12/1941, MRN [id number]  Primary Cardiologist: Peter Jordan, MD Primary Heart Failure: None Electrophysiologist: Danelle Birmingham, MD       History of Present Illness:   Lisa [name] is a 82 y.o. female with h/o CKD III, HLD, HTN, LBBB, DCCV 07/2018 for AF, CHB s/p PPM  seen today for routine electrophysiology followup.   Admit 12/14-12/16/24 for chest pain with shortness of breath and palpitations. She was found to be in [facility] (LBBB). CP was thought to be related to AF.  Lopressor  was increased to 50 mg BID. Her losartan  & hydrochlorothiazide  were held to allow for up titration of lopressor .  Pt called the office on 12/17 with elevated BP.  Dr. Jordan increased the patients hydrochlorothiazide  to 25mg  daily. She called again on 12/23 with dizziness and elevated BP.  She had been adjusting her metoprolol  as she felt it was the culprit. She was scheduled for an appt with Dr. Jordan on 12/26 for BP follow up.    Since last being seen in our clinic the patient reports she is getting back to normal.  Reports she knew immediately when she was in AF for prior episode in mid December.  Remains very active. Husband wants her to go to the gym.  She denies chest pain, palpitations, dyspnea, PND, orthopnea, nausea, vomiting, dizziness, syncope, edema, weight gain, or early satiety.   Review of systems complete and found to be negative unless listed in HPI.    EP Information / Studies Reviewed:    EKG is not ordered today. EKG from 09/08/23 reviewed which showed AF with RVR, LBBB      PPM Interrogation-  reviewed in detail today,  See PACEART report.  Device History: Biotronik Dual Chamber PPM implanted 12/04/21 for Symptomatic bradycardia  Studies:  ECHO 08/2023 >  LVEF 50%, LV global hypokinesis with septal-lateral dyssynchrony consistent with LBBB. G2DD, LA moderately dilated, MV degenerative, no evidence of  regurgitation / stenosis.  Arrhythmia / AAD  AF  DCCV 07/2018 for AF    Risk Assessment/Calculations:    CHA2DS2-VASc Score = 4   This indicates a 4.8% annual risk of stroke. The patient's score is based upon: CHF History: 0 HTN History: 1 Diabetes History: 0 Stroke History: 0 Vascular Disease History: 0 Age Score: 2 Gender Score: 1             Physical Exam:   VS:  BP 110/60   Pulse 60   Ht 5' 5 (1.651 m)   Wt 157 lb (71.2 kg)   SpO2 98%   BMI 26.13 kg/m    Wt Readings from Last 3 Encounters:  09/24/23 157 lb (71.2 kg)  09/19/23 156 lb (70.8 kg)  09/09/23 154 lb 1.6 oz (69.9 kg)     GEN: Well nourished, well developed in no acute distress NECK: No JVD; No carotid bruits CARDIAC: Regular rate and rhythm, no murmurs, rubs, gallops RESPIRATORY:  Clear to auscultation without rales, wheezing or rhonchi  ABDOMEN: Soft, non-tender, non-distended EXTREMITIES:  No edema; No deformity   ASSESSMENT AND PLAN:    Symptomatic bradycardia s/p Biotronik PPM  -Normal PPM function -See Pace Art report -with industry support, CLS turned on  -in SR by EGM  Paroxsymal Atrial Fibrillation  DCCV 07/2018  -OAC for stroke prophylaxis  -continue Lopressor  50 mg BID  -labs from recent hospitalization reviewed  Secondary Hypercoagulable State  -continue Eliquis  5mg  BID, dose  reviewed and appropriate by wt / cr  Hypertension  -well controlled on current regimen    Disposition:   Follow up with Dr. Waddell in 12 months  Signed, Daphne Barrack, MSN, APRN, NP-C, AGACNP-BC [facility] - Electrophysiology  09/24/2023, 8:46 AM

## 2023-09-24 ENCOUNTER — Encounter: Payer: Self-pay | Admitting: Pulmonary Disease

## 2023-09-24 ENCOUNTER — Ambulatory Visit: Payer: Medicare Other | Attending: Pulmonary Disease | Admitting: Pulmonary Disease

## 2023-09-24 VITALS — BP 110/60 | HR 60 | Ht 65.0 in | Wt 157.0 lb

## 2023-09-24 DIAGNOSIS — I480 Paroxysmal atrial fibrillation: Secondary | ICD-10-CM | POA: Diagnosis not present

## 2023-09-24 DIAGNOSIS — I442 Atrioventricular block, complete: Secondary | ICD-10-CM

## 2023-09-24 DIAGNOSIS — D6869 Other thrombophilia: Secondary | ICD-10-CM | POA: Diagnosis not present

## 2023-09-24 LAB — CUP PACEART INCLINIC DEVICE CHECK
Date Time Interrogation Session: 20241231085031
Implantable Lead Connection Status: 753985
Implantable Lead Connection Status: 753985
Implantable Lead Implant Date: 20230313
Implantable Lead Implant Date: 20230313
Implantable Lead Location: 753859
Implantable Lead Location: 753860
Implantable Lead Model: 377171
Implantable Lead Model: 377171
Implantable Lead Serial Number: 7000407809
Implantable Lead Serial Number: 8000780983
Implantable Pulse Generator Implant Date: 20230313
Pulse Gen Model: 407145
Pulse Gen Serial Number: 70364045

## 2023-09-24 NOTE — Patient Instructions (Signed)
 Medication Instructions:  Your physician recommends that you continue on your current medications as directed. Please refer to the Current Medication list given to you today.  *If you need a refill on your cardiac medications before your next appointment, please call your pharmacy*  Lab Work: If you have labs (blood work) drawn today and your tests are completely normal, you will receive your results only by: MyChart Message (if you have MyChart) OR A paper copy in the mail If you have any lab test that is abnormal or we need to change your treatment, we will call you to review the results.  Follow-Up: At [facility], you and your health needs are our priority.  As part of our continuing mission to provide you with exceptional heart care, we have created designated Provider Care Teams.  These Care Teams include your primary Cardiologist (physician) and Advanced Practice Providers (APPs -  Physician Assistants and Nurse Practitioners) who all work together to provide you with the care you need, when you need it.  We recommend signing up for the patient portal called MyChart.  Sign up information is provided on this After Visit Summary.  MyChart is used to connect with patients for Virtual Visits (Telemedicine).  Patients are able to view lab/test results, encounter notes, upcoming appointments, etc.  Non-urgent messages can be sent to your provider as well.   To learn more about what you can do with MyChart, go to [email or web address].    Your next appointment:   1 year(s)  Provider:   You may see Danelle Birmingham, MD or one of the following Advanced Practice Providers on your designated Care Team:   Daphne Barrack, NP

## 2023-09-27 ENCOUNTER — Telehealth: Payer: Self-pay | Admitting: Cardiology

## 2023-09-27 NOTE — Telephone Encounter (Signed)
 Called pt she states that she is feeling better now.  Her BP now is 149/89 HR 89. Pt states that recently Dr Jordan recently changed her medications and she was fine until now having these symptoms today. They are almost completely resolved now, still has nausea  all day.  BP 09-25-23 @ 1030am 144/84 HR 85 09-26-23 @ 1030am 126/76 HR 76.  Pt states that her BO was coming down nicely until today when her sx worsened. Pt states that she has been nauseous for quite awhile and Dr Jordan cannot figure out why. She will call back if sx worsen again. She will continue current medications. She is going to take her BP TID (after medications-only)

## 2023-09-27 NOTE — Telephone Encounter (Addendum)
 Pt c/o BP issue: STAT if pt c/o blurred vision, one-sided weakness or slurred speech  1. What are your last 5 BP readings?  81/47 148/70  2. Are you having any other symptoms (ex. Dizziness, headache, blurred vision, passed out)? Dizzy, coughing and nausous.   3. What is your BP issue? Low presssure,  husband called stating she almost passed out.  He stated the fire dept and EMS was there.

## 2023-09-30 ENCOUNTER — Ambulatory Visit: Payer: Medicare Other | Admitting: Emergency Medicine

## 2023-10-01 DIAGNOSIS — G8929 Other chronic pain: Secondary | ICD-10-CM | POA: Diagnosis not present

## 2023-10-01 DIAGNOSIS — M25561 Pain in right knee: Secondary | ICD-10-CM | POA: Diagnosis not present

## 2023-10-01 DIAGNOSIS — M1711 Unilateral primary osteoarthritis, right knee: Secondary | ICD-10-CM | POA: Diagnosis not present

## 2023-10-01 NOTE — Telephone Encounter (Signed)
 Spoke to patient she stated she called 911 last Friday 1/3 due to low B/P.Stated her B/P has been up and down.Stated since she started Hydralazine  she has been having nausea and vomiting.She also has been having headaches.B/P yesterday 168/88.Today 167/69.She will continue to monitor B/P daily.Appointment scheduled with Dr.Jordan 1/9 at 1:20 pm.She will bring B/P readings to appointment.

## 2023-10-02 ENCOUNTER — Ambulatory Visit: Payer: Medicare Other | Admitting: Student

## 2023-10-03 ENCOUNTER — Ambulatory Visit: Payer: Medicare Other | Attending: Cardiology | Admitting: Cardiology

## 2023-10-03 VITALS — BP 160/80 | Ht 65.0 in | Wt 158.0 lb

## 2023-10-03 DIAGNOSIS — I442 Atrioventricular block, complete: Secondary | ICD-10-CM

## 2023-10-03 DIAGNOSIS — R42 Dizziness and giddiness: Secondary | ICD-10-CM

## 2023-10-03 DIAGNOSIS — I10 Essential (primary) hypertension: Secondary | ICD-10-CM | POA: Diagnosis not present

## 2023-10-03 DIAGNOSIS — I480 Paroxysmal atrial fibrillation: Secondary | ICD-10-CM

## 2023-10-03 MED ORDER — DILTIAZEM HCL ER COATED BEADS 120 MG PO CP24: 120.0000 mg | ORAL_CAPSULE | Freq: Every day | ORAL | 3 refills | Status: DC

## 2023-10-03 NOTE — Progress Notes (Signed)
 Cardiology Office Note:    Date:  10/03/2023   ID:  Lisa [name], DOB 12/12/1940, MRN [id number]  PCP:  Purcell Emil Schanz, MD [facility] Cardiologist: Brycelyn Gambino, MD   Reason for visit: Follow-up ED visit for HTN  History of Present Illness:    Lisa [name] is a 83 y.o. female with a hx of atrial fibrillation s/p DCCV in 07/2018 (sx SOB, swelling, indigestion), hypertension, hyperlipidemia, left bundle branch block.   Myoview  in January 2019 was normal.    She was seen in ED at [facility] in Jan 2023. She had an episode of syncope at home. Following this she had acute N/V and diarrhea.  Ecg showed NSR with chronic LBBB. BP was quite high to 202/110. Labs including troponins were negative. She hasn't had any further dizziness or syncope but has persistent elevated BP.  She was on metoprolol , losartan  and HCTZ.  When seen in Feb she was in NSR. Later presented in March with recurrent Afib. She underwent successful DCCV. 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 She was admitted and underwent placement of dual chamber pacemaker by Dr Waddell. Echo was unremarkable. She was DC on metoprolol  and Eliquis . Prior renal artery duplex was negative for RAS.   Pacemaker follow up in September was normal.   I saw her on Dec 3 and she was doing well. She was admitted overnight on 12/15 for palpitations. Pacer interrogation showed Afib with RVR. She converted. Had atypical chest pain. Minimal troponin leak metoprolol  was increased to 50 mg bid and losartan  and HCT held. I saw her on 12/26 and she was doing ok. BP was high. Started on low dose hydralazine .  Seen in EP office on 12/31 and was in NSR then.   She is seen today as a work in. Notes she has nausea in the morning. Notes  BP has been erratic. Had an episode of acute nausea, lightheadedness and HA on Jan 3. Noted BP 85/51. Called EMS and they noted BP had improved by  then. Symptoms seemed to improve. BP since then has been 124-167 systolic, 74-87 diastolic.  This am again awoke with acute lightheadedness, felt like she might pass out. Complain of HA and nausea without vomiting. Mild chest pressure.      Past Medical History:  Diagnosis Date   Bundle branch block, left    Dyslipidemia    GERD (gastroesophageal reflux disease)    Hypercholesteremia    Hypertension    LBBB (left bundle branch block)    Paroxysmal atrial fibrillation (HCC)    UTI (urinary tract infection)     Past Surgical History:  Procedure Laterality Date   bladder polyp removal     BREAST EXCISIONAL BIOPSY Right    60 yrs ago- Benign   BREAST LUMPECTOMY Left    No Visible Scar, said 30 + years ago in situ, no chemo, no radiation, or hormone replacement     CARDIOVERSION N/A 07/30/2018   Procedure: CARDIOVERSION;  Surgeon: Jeffrie Oneil BROCKS, MD;  [facility];  Service: Cardiovascular;  Laterality: N/A;   CARDIOVERSION N/A 11/28/2021   Procedure: CARDIOVERSION;  Surgeon: Waddell Danelle ORN, MD;  Location: [facility] INVASIVE CV LAB;  Service: Cardiovascular;  Laterality: N/A;   CESAREAN SECTION     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   Procedure: PACEMAKER IMPLANT;  Surgeon: Waddell Danelle ORN, MD;  Location: [facility] INVASIVE CV LAB;  Service:  Cardiovascular;  Laterality: N/A;    Current Medications: Current Meds  Medication Sig   acetaminophen  (ACETAMINOPHEN  8 HOUR) 650 MG CR tablet Take 650 mg by mouth as needed for pain (right knee pain).   apixaban  (ELIQUIS ) 5 MG TABS tablet TAKE 1 TABLET BY MOUTH TWICE DAILY   Cholecalciferol (VITAMIN D3) 50 MCG (2000 UT) TABS Take 2,000 Units by mouth daily.   hydrALAZINE  (APRESOLINE ) 25 MG tablet Take 1 tablet (25 mg total) by mouth 3 (three) times daily.   losartan  (COZAAR ) 100 MG tablet Take 1 tablet (100 mg total) by mouth daily.   metoprolol  tartrate (LOPRESSOR ) 50 MG tablet Take 1 tablet (50 mg total) by mouth 2 (two) times daily.   ondansetron   (ZOFRAN ) 4 MG tablet Take 1 tablet (4 mg total) by mouth every 8 (eight) hours as needed for nausea or vomiting.   pantoprazole  (PROTONIX ) 40 MG tablet Take 1 tablet (40 mg total) by mouth 2 (two) times daily.     Allergies:   Codeine, Fosamax [alendronate sodium], Lisinopril, Olmesartan, and Amlodipine   Social History   Socioeconomic History   Marital status: Married    Spouse name: Not on file   Number of children: 2   Years of education: Not on file   Highest education level: Associate degree: occupational, scientist, product/process development, or vocational program  Occupational History   Not on file  Tobacco Use   Smoking status: Former    Current packs/day: 0.00    Types: Cigarettes    Start date: 66    Quit date: 1993    Years since quitting: 32.0   Smokeless tobacco: Never  Vaping Use   Vaping status: Never Used  Substance and Sexual Activity   Alcohol use: Yes   Drug use: No   Sexual activity: Not on file  Other Topics Concern   Not on file  Social History Narrative   Not on file   Social Drivers of Health   Financial Resource Strain: Low Risk  (04/30/2023)   Overall Financial Resource Strain (CARDIA)    Difficulty of Paying Living Expenses: Not hard at all  Food Insecurity: No Food Insecurity (09/08/2023)   Hunger Vital Sign    Worried About Running Out of Food in the Last Year: Never true    Ran Out of Food in the Last Year: Never true  Transportation Needs: No Transportation Needs (09/08/2023)   PRAPARE - Administrator, Civil Service (Medical): No    Lack of Transportation (Non-Medical): No  Physical Activity: Sufficiently Active (04/30/2023)   Exercise Vital Sign    Days of Exercise per Week: 5 days    Minutes of Exercise per Session: 60 min  Stress: No Stress Concern Present (04/30/2023)   [employer] of Occupational Health - Occupational Stress Questionnaire    Feeling of Stress : Not at all  Social Connections: Moderately Isolated (04/30/2023)   Social  Connection and Isolation Panel [NHANES]    Frequency of Communication with Friends and Family: Twice a week    Frequency of Social Gatherings with Friends and Family: Once a week    Attends Religious Services: Never    Database Administrator or Organizations: No    Attends Engineer, Structural: Never    Marital Status: Married     Family History: The patient's family history includes Breast cancer in her mother; CVA in her brother and brother; Crohn's disease in her brother; Diabetes in her brother; Hip fracture in her mother;  Other in her brother; Renal cancer in her brother; Ulcers in her father.  ROS:   Please see the history of present illness.     EKGs/Labs/Other Studies Reviewed:    EKG Interpretation Date/Time:  Thursday October 03 2023 13:36:33 EST Ventricular Rate:  81 PR Interval:    QRS Duration:  138 QT Interval:  426 QTC Calculation: 494 R Axis:   59  Text Interpretation: Ventricular-paced rhythm with frequent atrial-paced complexes When compared with ECG of 08-Sep-2023 08:51, Electronic ventricular pacemaker has replaced Atrial fibrillation Confirmed by Areona Homer [phone]) on 10/03/2023 1:39:04 PM   Recent Labs: 09/07/2023: B Natriuretic Peptide 157.3 09/08/2023: ALT 14; BUN 22; Creatinine, Ser 1.23; Hemoglobin 12.8; Magnesium  1.7; Platelets 322; Potassium 4.7; Sodium 138; TSH 2.201   Recent Lipid Panel Lab Results  Component Value Date/Time   CHOL 210 (H) 03/26/2023 09:04 AM   TRIG 123.0 03/26/2023 09:04 AM   HDL 52.40 03/26/2023 09:04 AM   LDLCALC 133 (H) 03/26/2023 09:04 AM    Dated 06/03/20: cholesterol 159, triglycerides 94, HDL 59, LDL 83. Dated 06/15/21: A1c 6.3, Hgb 12.5. chemistries and TSH normal. Dated 11/15/21:A1c 6.3%   Echo 11/30/21: IMPRESSIONS     1. Left ventricular ejection fraction, by estimation, is 60 to 65%. Left  ventricular ejection fraction by 3D volume is 66 %. The left ventricle has  normal function. The left ventricle  has no regional wall motion  abnormalities. Left ventricular diastolic   parameters are consistent with Grade III diastolic dysfunction  (restrictive). Elevated left ventricular end-diastolic pressure.   2. Right ventricular systolic function is normal. The right ventricular  size is mildly enlarged. There is normal pulmonary artery systolic  pressure. The estimated right ventricular systolic pressure is 23.4 mmHg.   3. Left atrial size was moderately dilated.   4. Right atrial size was mildly dilated.   5. The mitral valve is normal in structure. No evidence of mitral valve  regurgitation. No evidence of mitral stenosis. Severe mitral annular  calcification.   6. The aortic valve is tricuspid. Aortic valve regurgitation is not  visualized. Aortic valve sclerosis/calcification is present, without any  evidence of aortic stenosis.   7. Aortic dilatation noted. There is borderline dilatation of the  ascending aorta, measuring 37 mm.   8. The inferior vena cava is normal in size with greater than 50%  respiratory variability, suggesting right atrial pressure of 3 mmHg.   Physical Exam:    VS:  BP (!) 160/80 (BP Location: Right Arm, Patient Position: Sitting, Cuff Size: Normal)   Ht 5' 5 (1.651 m)   Wt 158 lb (71.7 kg)   SpO2 98%   BMI 26.29 kg/m    No data found. I repeat BP and it was 134/80  Wt Readings from Last 3 Encounters:  10/03/23 158 lb (71.7 kg)  09/24/23 157 lb (71.2 kg)  09/19/23 156 lb (70.8 kg)     GEN:  Well nourished, well developed in no acute distress HEENT: Normal NECK: No JVD; No carotid bruits CARDIAC: RRR, no murmurs, rubs, gallops RESPIRATORY:  Clear to auscultation without rales, wheezing or rhonchi  ABDOMEN: Soft, non-tender, non-distended MUSCULOSKELETAL: No edema; No deformity  SKIN: Warm and dry NEUROLOGIC:  Alert and oriented PSYCHIATRIC:  Normal affect     ASSESSMENT AND PLAN    Acute dizziness, nausea, HA. These symptoms all started  when we added hydralazine  for BP. She is still in NSR and for the most part BP has been ok although  fluctuating. I have recommended stopping hydralazine . Will add diltiazem  120 mg daily. Continue losartan  and metoprolol . History of swelling on amlodipine. Prior intolerance of hydrochlorothiazide  and Cardura .  HTN - Essential. See above.  -will continue losartan  100 mg - continue metoprolol   50 mg bid - due to CKD will stop HCT - start diltiazem  120 mg daily - asked keep BP readings.   - Dash diet. - continue to monitor at home  2. Paroxysmal atrial fibrillation -Status post DCCV in November 2019 and again last year in Feb -Echo showed mild LV dysfunction with dyssynergy due to LBBB. Moderate LAE.  - had no Afib until Dec 15. Now in NSR -Continue Eliquis  for stroke prevention and higher metoprolol  dose  for rate control.   - last Echo showed normal LV function - if she does have more frequent Afib may need to consider AAD therapy  3. LBBB chronic.   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   5. HLD. Intolerant to statins.  No known history of vascular disease. Will focus on lifestyle modification for now.   6. GERD. Nausea. On PRotonix  bid. Will give prn Zofran . If symptoms do not improve with above mediction changes may need further GI evaluation.    Disposition - Follow-up in 2 months         Medication Adjustments/Labs and Tests Ordered: Current medicines are reviewed at length with the patient today.  Concerns regarding medicines are outlined above.  No orders of the defined types were placed in this encounter.  No orders of the defined types were placed in this encounter.   There are no Patient Instructions on file for this visit.   Signed, Kamyrah Feeser, MD  10/03/2023 1:22 PM    [facility]

## 2023-10-03 NOTE — Patient Instructions (Signed)
 Medication Instructions:  Stop Hydralazine  Start Diltiazem  120 mg daily Continue all other medications *If you need a refill on your cardiac medications before your next appointment, please call your pharmacy*   Lab Work: None ordered   Testing/Procedures: None ordered   Follow-Up: At [facility], you and your health needs are our priority.  As part of our continuing mission to provide you with exceptional heart care, we have created designated Provider Care Teams.  These Care Teams include your primary Cardiologist (physician) and Advanced Practice Providers (APPs -  Physician Assistants and Nurse Practitioners) who all work together to provide you with the care you need, when you need it.  We recommend signing up for the patient portal called MyChart.  Sign up information is provided on this After Visit Summary.  MyChart is used to connect with patients for Virtual Visits (Telemedicine).  Patients are able to view lab/test results, encounter notes, upcoming appointments, etc.  Non-urgent messages can be sent to your provider as well.   To learn more about what you can do with MyChart, go to [email or web address].    Your next appointment:  Tue 2/25 at 1:40 pm    Provider:  Dr.Jordan

## 2023-10-08 ENCOUNTER — Ambulatory Visit: Payer: Medicare Other | Admitting: Emergency Medicine

## 2023-10-08 ENCOUNTER — Encounter: Payer: Self-pay | Admitting: Emergency Medicine

## 2023-10-08 VITALS — BP 130/70 | HR 76 | Temp 97.8°F | Ht 65.0 in | Wt 158.0 lb

## 2023-10-08 DIAGNOSIS — I480 Paroxysmal atrial fibrillation: Secondary | ICD-10-CM

## 2023-10-08 DIAGNOSIS — I442 Atrioventricular block, complete: Secondary | ICD-10-CM | POA: Diagnosis not present

## 2023-10-08 DIAGNOSIS — D6859 Other primary thrombophilia: Secondary | ICD-10-CM

## 2023-10-08 DIAGNOSIS — R7303 Prediabetes: Secondary | ICD-10-CM

## 2023-10-08 DIAGNOSIS — I10 Essential (primary) hypertension: Secondary | ICD-10-CM

## 2023-10-08 DIAGNOSIS — R1013 Epigastric pain: Secondary | ICD-10-CM | POA: Diagnosis not present

## 2023-10-08 DIAGNOSIS — N1831 Chronic kidney disease, stage 3a: Secondary | ICD-10-CM

## 2023-10-08 DIAGNOSIS — R110 Nausea: Secondary | ICD-10-CM | POA: Insufficient documentation

## 2023-10-08 DIAGNOSIS — E785 Hyperlipidemia, unspecified: Secondary | ICD-10-CM

## 2023-10-08 DIAGNOSIS — K219 Gastro-esophageal reflux disease without esophagitis: Secondary | ICD-10-CM

## 2023-10-08 NOTE — Assessment & Plan Note (Addendum)
 Acceptable blood pressure readings at home Continue present medications Continue losartan 100 mg daily, metoprolol tartrate 50 mg twice a day and diltiazem 120 mg daily Due to chronic kidney disease had hydrochlorothiazide recently stopped

## 2023-10-08 NOTE — Assessment & Plan Note (Signed)
 Active and affecting quality of life Multifactorial Could be related to multiple medications However has history of GERD, on Protonix twice a day Recommend upper endoscopy.  GI referral placed today.

## 2023-10-08 NOTE — Assessment & Plan Note (Signed)
Stable.  Diet and nutrition discussed. 

## 2023-10-08 NOTE — Assessment & Plan Note (Signed)
 Intolerant to statins.  No known history of vascular disease. Will focus on lifestyle modification for now.

## 2023-10-08 NOTE — Assessment & Plan Note (Signed)
Pacemaker in place.  Doing well.  No concerns.

## 2023-10-08 NOTE — Assessment & Plan Note (Signed)
 Stable.  Advised to stay well-hydrated and avoid NSAIDs.

## 2023-10-08 NOTE — Assessment & Plan Note (Signed)
Continues Eliquis 5 mg twice a day

## 2023-10-08 NOTE — Assessment & Plan Note (Signed)
 Well-controlled heart rate.  Still in A-fib. On long-term anticoagulation with Eliquis 5 mg twice a day Continues beta-blocker with metoprolol tartrate 50 mg twice a day No clinical bleeding episodes Fall precautions given

## 2023-10-08 NOTE — Progress Notes (Addendum)
 [name] 83 y.o.   Chief Complaint  Patient presents with   Follow-up    6 month f/u. 6-8 weeks pt states not feeling well. did have a recent ED visit she thought she was having a heart attack. Was in Afib 9 days did see cardiologist last week. Medications were changed and she had concerns about. Had a recent MRI of her knee and had different advices.     HISTORY OF PRESENT ILLNESS: This is a 83 y.o. female here for 67-month follow-up of multiple chronic medical conditions Was admitted to the hospital last December complaining of palpitations Has been having persistent intermittent bouts of nausea for the last couple of months Has history of GERD.  Presently on Protonix  twice a day and still having symptoms. Has chronic pain to right knee.  Recently seen by orthopedist.  MRI shows significant findings.  Chooses conservative treatment.  Was given the option of total knee replacement. Recently seen by cardiologist.  Had blood pressure medications changed Blood pressure readings at home with acceptable values Office assessment and plan as follows: ASSESSMENT AND PLAN      Acute dizziness, nausea, HA. These symptoms all started when we added hydralazine  for BP. She is still in NSR and for the most part BP has been ok although fluctuating. I have recommended stopping hydralazine . Will add diltiazem  120 mg daily. Continue losartan  and metoprolol . History of swelling on amlodipine. Prior intolerance of hydrochlorothiazide  and Cardura .   HTN - Essential. See above.  -will continue losartan  100 mg - continue metoprolol   50 mg bid - due to CKD will stop HCT - start diltiazem  120 mg daily - asked keep BP readings.   - Dash diet. - continue to monitor at home   2. Paroxysmal atrial fibrillation -Status post DCCV in November 2019 and again last year in Feb -Echo showed mild LV dysfunction with dyssynergy due to LBBB. Moderate LAE.  - had no Afib until Dec 15. Now in NSR -Continue Eliquis   for stroke prevention and higher metoprolol  dose  for rate control.   - last Echo showed normal LV function - if she does have more frequent Afib may need to consider AAD therapy   3. LBBB chronic.    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    5. HLD. Intolerant to statins.  No known history of vascular disease. Will focus on lifestyle modification for now.    6. GERD. Nausea. On PRotonix  bid. Will give prn Zofran . If symptoms do not improve with above mediction changes may need further GI evaluation.       Disposition - Follow-up in 2 months  HPI   Prior to Admission medications   Medication Sig Start Date End Date Taking? Authorizing Provider  acetaminophen  (ACETAMINOPHEN  8 HOUR) 650 MG CR tablet Take 650 mg by mouth as needed for pain (right knee pain).    [provider]  apixaban  (ELIQUIS ) 5 MG TABS tablet TAKE 1 TABLET BY MOUTH TWICE DAILY 04/12/23   Jordan, Peter M, MD  Cholecalciferol (VITAMIN D3) 50 MCG (2000 UT) TABS Take 2,000 Units by mouth daily.    [provider]  diltiazem  (CARDIZEM  CD) 120 MG 24 hr capsule Take 1 capsule (120 mg total) by mouth daily. 10/03/23 01/01/24  Jordan, Peter M, MD  losartan  (COZAAR ) 100 MG tablet Take 1 tablet (100 mg total) by mouth daily. 10/31/22   Jordan, Peter M, MD  metoprolol  tartrate (LOPRESSOR )  50 MG tablet Take 1 tablet (50 mg total) by mouth 2 (two) times daily. 09/09/23 12/08/23  Laurence Locus, DO  ondansetron  (ZOFRAN ) 4 MG tablet Take 1 tablet (4 mg total) by mouth every 8 (eight) hours as needed for nausea or vomiting. 09/19/23   Jordan, Peter M, MD  pantoprazole  (PROTONIX ) 40 MG tablet Take 1 tablet (40 mg total) by mouth 2 (two) times daily. 09/09/23 12/08/23  Laurence Locus, DO    Allergies  Allergen Reactions   Codeine Other (See Comments) and Nausea And Vomiting    GI upset Other reaction(s): stomach upset   Fosamax [Alendronate Sodium] Other (See Comments)     Tooth problems   Lisinopril Cough   Olmesartan Other (See Comments)    Possible photodermatitis    Amlodipine Swelling    edema  edema     edema    Patient Active Problem List   Diagnosis Date Noted   Atypical chest pain 09/08/2023   Palpitations 09/08/2023   Elevated troponin 09/08/2023   Leukocytosis 09/08/2023   Chronic diastolic CHF (congestive heart failure) (HCC) 09/08/2023   Elevated troponin I level 09/08/2023   AKI (acute kidney injury) (HCC) 09/08/2023   Lower urinary tract symptoms 05/02/2023   Prediabetes 03/26/2023   Statin intolerance 03/26/2023   Thrombophilia (HCC) 09/25/2022   Current use of long term anticoagulation 09/25/2022   Dyslipidemia 03/14/2022   Bartholin cyst 02/02/2022   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   Age-related osteoporosis without current pathological fracture 11/27/2021   Chronic kidney disease, stage 3a (HCC) 11/27/2021   Essential hypertension 11/27/2021   Impaired fasting glucose 11/27/2021   Mixed hyperlipidemia 11/27/2021   Personal history of malignant neoplasm of breast 11/27/2021   Personal history of urinary calculi 11/27/2021   Vitamin D deficiency 11/27/2021   Changing skin lesion 05/12/2021   Actinic keratosis 02/24/2019   Paroxysmal atrial fibrillation (HCC)    GERD (gastroesophageal reflux disease)    Left bundle branch block    Pilar cyst 04/17/2016    Past Medical History:  Diagnosis Date   Bundle branch block, left    Dyslipidemia    GERD (gastroesophageal reflux disease)    Hypercholesteremia    Hypertension    LBBB (left bundle branch block)    Paroxysmal atrial fibrillation (HCC)    UTI (urinary tract infection)     Past Surgical History:  Procedure Laterality Date   bladder polyp removal     BREAST EXCISIONAL BIOPSY Right    60 yrs ago- Benign   BREAST LUMPECTOMY Left    No Visible Scar, said 30 + years ago in situ, no chemo, no radiation, or hormone replacement     CARDIOVERSION N/A  07/30/2018   Procedure: CARDIOVERSION;  Surgeon: Jeffrie Oneil BROCKS, MD;  Location: [facility] ENDOSCOPY;  Service: Cardiovascular;  Laterality: N/A;   CARDIOVERSION N/A 11/28/2021   Procedure: CARDIOVERSION;  Surgeon: Waddell Danelle ORN, MD;  Location: [facility] INVASIVE CV LAB;  Service: Cardiovascular;  Laterality: N/A;   CESAREAN SECTION     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   Procedure: PACEMAKER IMPLANT;  Surgeon: Waddell Danelle ORN, MD;  Location: [facility] INVASIVE CV LAB;  Service: Cardiovascular;  Laterality: N/A;    Social History   Socioeconomic History   Marital status: Married    Spouse name: Not on file   Number of children: 2   Years of education: Not on file   Highest education level: Associate degree: occupational, scientist, product/process development, or vocational program  Occupational History   Not  on file  Tobacco Use   Smoking status: Former    Current packs/day: 0.00    Types: Cigarettes    Start date: 14    Quit date: 1993    Years since quitting: 32.0   Smokeless tobacco: Never  Vaping Use   Vaping status: Never Used  Substance and Sexual Activity   Alcohol use: Yes   Drug use: No   Sexual activity: Not on file  Other Topics Concern   Not on file  Social History Narrative   Not on file   Social Drivers of Health   Financial Resource Strain: Low Risk  (10/07/2023)   Overall Financial Resource Strain (CARDIA)    Difficulty of Paying Living Expenses: Not hard at all  Food Insecurity: No Food Insecurity (10/07/2023)   Hunger Vital Sign    Worried About Running Out of Food in the Last Year: Never true    Ran Out of Food in the Last Year: Never true  Transportation Needs: No Transportation Needs (10/07/2023)   PRAPARE - Administrator, Civil Service (Medical): No    Lack of Transportation (Non-Medical): No  Physical Activity: Sufficiently Active (10/07/2023)   Exercise Vital Sign    Days of Exercise per Week: 5 days    Minutes of Exercise per Session: 60 min  Stress: Stress Concern Present  (10/07/2023)   [employer] of Occupational Health - Occupational Stress Questionnaire    Feeling of Stress : To some extent  Social Connections: Unknown (10/07/2023)   Social Connection and Isolation Panel [NHANES]    Frequency of Communication with Friends and Family: More than three times a week    Frequency of Social Gatherings with Friends and Family: Three times a week    Attends Religious Services: Patient declined    Active Member of Clubs or Organizations: No    Attends Banker Meetings: Never    Marital Status: Married  Catering Manager Violence: Not At Risk (09/08/2023)   Humiliation, Afraid, Rape, and Kick questionnaire    Fear of Current or Ex-Partner: No    Emotionally Abused: No    Physically Abused: No    Sexually Abused: No    Family History  Problem Relation Age of Onset   Breast cancer Mother    Hip fracture Mother    Ulcers Father    CVA Brother    Renal cancer Brother    Other Brother        hip replacement   CVA Brother        Had PaceMaker   Crohn's disease Brother    Diabetes Brother      Review of Systems  Constitutional: Negative.  Negative for chills and fever.  HENT: Negative.  Negative for congestion and sore throat.   Respiratory: Negative.  Negative for cough and shortness of breath.   Cardiovascular: Negative.  Negative for chest pain and palpitations.  Gastrointestinal:  Positive for nausea.  Genitourinary: Negative.  Negative for dysuria and hematuria.  Skin: Negative.  Negative for rash.  Neurological: Negative.  Negative for dizziness and headaches.  All other systems reviewed and are negative.   Vitals:   10/08/23 1332  BP: (!) 174/90  Pulse: 76  Temp: 97.8 F (36.6 C)  SpO2: 96%     Physical Exam Vitals reviewed.  Constitutional:      Appearance: Normal appearance.  HENT:     Head: Normocephalic.     Mouth/Throat:     Mouth: Mucous membranes  are moist.     Pharynx: Oropharynx is clear.  Eyes:      Extraocular Movements: Extraocular movements intact.     Conjunctiva/sclera: Conjunctivae normal.     Pupils: Pupils are equal, round, and reactive to light.  Cardiovascular:     Rate and Rhythm: Normal rate. Rhythm irregular.     Pulses: Normal pulses.     Heart sounds: Normal heart sounds.  Pulmonary:     Effort: Pulmonary effort is normal.     Breath sounds: Normal breath sounds.  Abdominal:     Palpations: Abdomen is soft.     Tenderness: There is no abdominal tenderness.  Musculoskeletal:     Cervical back: No tenderness.  Lymphadenopathy:     Cervical: No cervical adenopathy.  Skin:    General: Skin is warm and dry.  Neurological:     General: No focal deficit present.     Mental Status: She is alert and oriented to person, place, and time.  Psychiatric:        Mood and Affect: Mood normal.        Behavior: Behavior normal.      ASSESSMENT & PLAN: A total of 47 minutes was spent with the patient and counseling/coordination of care regarding preparing for this visit, review of most recent office visit notes, review of most recent cardiologist and orthopedic office visit notes, review of most recent hospital discharge summary, review of multiple chronic medical conditions and their management, review of all medications, review of most recent bloodwork results, review of health maintenance items, education on nutrition, prognosis, documentation, and need for follow up.   Problem List Items Addressed This Visit       Cardiovascular and Mediastinum   Paroxysmal atrial fibrillation (HCC)   Well-controlled heart rate.  Still in A-fib. On long-term anticoagulation with Eliquis  5 mg twice a day Continues beta-blocker with metoprolol  tartrate 50 mg twice a day No clinical bleeding episodes Fall precautions given      Essential hypertension   Acceptable blood pressure readings at home Continue present medications Continue losartan  100 mg daily, metoprolol  tartrate 50 mg  twice a day and diltiazem  120 mg daily Due to chronic kidney disease had hydrochlorothiazide  recently stopped      Complete heart block (HCC)   Pacemaker in place. Doing well. No concerns         Digestive   GERD (gastroesophageal reflux disease)   Still active and affecting quality of life Persistent nausea Recommend GI evaluation for upper endoscopy Continues pantoprazole  40 mg twice a day        Genitourinary   Chronic kidney disease, stage 3a (HCC)   Stable.  Advised to stay well-hydrated and avoid NSAIDs        Hematopoietic and Hemostatic   Thrombophilia (HCC)   Continues Eliquis  5 mg twice a day        Other   Dyslipidemia    Intolerant to statins.  No known history of vascular disease. Will focus on lifestyle modification for now.      Prediabetes   Stable.  Diet and nutrition discussed.      Intractable nausea - Primary   Active and affecting quality of life Multifactorial Could be related to multiple medications However has history of GERD, on Protonix  twice a day Recommend upper endoscopy.  GI referral placed today.      Relevant Orders   Ambulatory referral to Gastroenterology   Other Visit Diagnoses  Dyspepsia       Relevant Orders   Ambulatory referral to Gastroenterology       Patient Instructions  Nausea, Adult Nausea is feeling like you may vomit. Feeling like you may vomit is usually not serious, but it may be an early sign of a more serious medical problem. Vomiting is when stomach contents forcefully come out of your mouth. If you vomit, or if you are not able to drink enough fluids, you may not have enough water in your body (get dehydrated). If you do not have enough water in your body, you may: Feel tired. Feel thirsty. Have a dry mouth. Have cracked lips. Pee (urinate) less often. Older adults and people who have other diseases or a weak body defense system (immune system) have a higher risk of not having enough water in  the body. The main goals of treating this condition are: To relieve your nausea. To ensure your nausea occurs less often. To prevent vomiting and losing too much fluid. Follow these instructions at home: Watch your symptoms for any changes. Tell your doctor about them. Eating and drinking     Take an ORS (oral rehydration solution). This is a drink that is sold at pharmacies and stores. Drink clear fluids in small amounts as you are able. These include: Water. Ice chips. Fruit juice that has water added (diluted fruit juice). Low-calorie sports drinks. Eat bland, easy-to-digest foods in small amounts as you are able, such as: Bananas. Applesauce. Rice. Low-fat (lean) meats. Toast. Crackers. Avoid drinking fluids that have a lot of sugar or caffeine in them. This includes energy drinks, sports drinks, and soda. Avoid alcohol. Avoid spicy or fatty foods. General instructions Take over-the-counter and prescription medicines only as told by your doctor. Rest at home while you get better. Drink enough fluid to keep your pee (urine) pale yellow. Take slow and deep breaths when you feel like you may vomit. Avoid food or things that have strong smells. Wash your hands often with soap and water for at least 20 seconds. If you cannot use soap and water, use hand sanitizer. Make sure that everyone in your home washes their hands well and often. Keep all follow-up visits. Contact a doctor if: You feel worse. You feel like you may vomit and this lasts for more than 2 days. You vomit. You are not able to drink fluids without vomiting. You have new symptoms. You have a fever. You have a headache. You have muscle cramps. You have a rash. You have pain while peeing. You feel light-headed or dizzy. Get help right away if: You have pain in your chest, neck, arm, or jaw. You feel very weak or you faint. You have vomit that is bright red or looks like coffee grounds. You have bloody or  black poop (stools) or poop that looks like tar. You have a very bad headache, a stiff neck, or both. You have very bad pain, cramping, or bloating in your belly (abdomen). You have trouble breathing or you are breathing very quickly. Your heart is beating very quickly. Your skin feels cold and clammy. You feel confused. You have signs of losing too much water in your body, such as: Dark pee, very little pee, or no pee. Cracked lips. Dry mouth. Sunken eyes. Sleepiness. Weakness. These symptoms may be an emergency. Get help right away. Call 911. Do not wait to see if the symptoms will go away. Do not drive yourself to the hospital. Summary Nausea is feeling  like you are about vomit. If you vomit, or if you are not able to drink enough fluids, you may not have enough water in your body (get dehydrated). Eat and drink what your doctor tells you. Take over-the-counter and prescription medicines only as told by your doctor. Contact a doctor right away if your symptoms get worse or you have new symptoms. Keep all follow-up visits. This information is not intended to replace advice given to you by your health care provider. Make sure you discuss any questions you have with your health care provider. Document Revised: 03/17/2021 Document Reviewed: 03/17/2021 Elsevier Patient Education  2024 Elsevier Inc.     Emil Schaumann, MD [facility]

## 2023-10-08 NOTE — Patient Instructions (Signed)
Nausea, Adult Nausea is feeling like you may vomit. Feeling like you may vomit is usually not serious, but it may be an early sign of a more serious medical problem. Vomiting is when stomach contents forcefully come out of your mouth. If you vomit, or if you are not able to drink enough fluids, you may not have enough water in your body (get dehydrated). If you do not have enough water in your body, you may: Feel tired. Feel thirsty. Have a dry mouth. Have cracked lips. Pee (urinate) less often. Older adults and people who have other diseases or a weak body defense system (immune system) have a higher risk of not having enough water in the body. The main goals of treating this condition are: To relieve your nausea. To ensure your nausea occurs less often. To prevent vomiting and losing too much fluid. Follow these instructions at home: Watch your symptoms for any changes. Tell your doctor about them. Eating and drinking     Take an ORS (oral rehydration solution). This is a drink that is sold at pharmacies and stores. Drink clear fluids in small amounts as you are able. These include: Water. Ice chips. Fruit juice that has water added (diluted fruit juice). Low-calorie sports drinks. Eat bland, easy-to-digest foods in small amounts as you are able, such as: Bananas. Applesauce. Rice. Low-fat (lean) meats. Toast. Crackers. Avoid drinking fluids that have a lot of sugar or caffeine in them. This includes energy drinks, sports drinks, and soda. Avoid alcohol. Avoid spicy or fatty foods. General instructions Take over-the-counter and prescription medicines only as told by your doctor. Rest at home while you get better. Drink enough fluid to keep your pee (urine) pale yellow. Take slow and deep breaths when you feel like you may vomit. Avoid food or things that have strong smells. Wash your hands often with soap and water for at least 20 seconds. If you cannot use soap and water,  use hand sanitizer. Make sure that everyone in your home washes their hands well and often. Keep all follow-up visits. Contact a doctor if: You feel worse. You feel like you may vomit and this lasts for more than 2 days. You vomit. You are not able to drink fluids without vomiting. You have new symptoms. You have a fever. You have a headache. You have muscle cramps. You have a rash. You have pain while peeing. You feel light-headed or dizzy. Get help right away if: You have pain in your chest, neck, arm, or jaw. You feel very weak or you faint. You have vomit that is bright red or looks like coffee grounds. You have bloody or black poop (stools) or poop that looks like tar. You have a very bad headache, a stiff neck, or both. You have very bad pain, cramping, or bloating in your belly (abdomen). You have trouble breathing or you are breathing very quickly. Your heart is beating very quickly. Your skin feels cold and clammy. You feel confused. You have signs of losing too much water in your body, such as: Dark pee, very little pee, or no pee. Cracked lips. Dry mouth. Sunken eyes. Sleepiness. Weakness. These symptoms may be an emergency. Get help right away. Call 911. Do not wait to see if the symptoms will go away. Do not drive yourself to the hospital. Summary Nausea is feeling like you are about vomit. If you vomit, or if you are not able to drink enough fluids, you may not have enough water in   your body (get dehydrated). Eat and drink what your doctor tells you. Take over-the-counter and prescription medicines only as told by your doctor. Contact a doctor right away if your symptoms get worse or you have new symptoms. Keep all follow-up visits. This information is not intended to replace advice given to you by your health care provider. Make sure you discuss any questions you have with your health care provider. Document Revised: 03/17/2021 Document Reviewed:  03/17/2021 Elsevier Patient Education  2024 Elsevier Inc.  

## 2023-10-08 NOTE — Assessment & Plan Note (Signed)
 Still active and affecting quality of life Persistent nausea Recommend GI evaluation for upper endoscopy Continues pantoprazole 40 mg twice a day

## 2023-10-11 NOTE — Progress Notes (Signed)
Remote pacemaker transmission.   

## 2023-10-11 NOTE — Addendum Note (Signed)
Addended by: Geralyn Flash D on: 10/11/2023 01:27 PM   Modules accepted: Orders

## 2023-10-16 DIAGNOSIS — K080 Exfoliation of teeth due to systemic causes: Secondary | ICD-10-CM | POA: Diagnosis not present

## 2023-10-25 NOTE — Progress Notes (Unsigned)
Chief Complaint: Dyspepsia and intractable nausea, endoscopy Primary GI Doctor:unassigned  HPI:  Patient is a  83  year old female/female patient with past medical history of GERD, hypertension, left bundle branch block, pacemaker, chronic kidney disease stage III, atrial fibrillation, dyslipidemia*****who was referred to me by Georgina Quint, * on 10/08/2023 for a complaint of tractable nausea and dyspepsia, endoscopy.    On 10/08/2023 patient seen by PCP and complained of acute dizziness, nausea and headache.  Patient states symptoms started when she was placed on hydralazine for blood pressure.  PCP recommended stopping hydralazine and added diltiazem.  Interval History  Patient admits/denies GERD Patient currently on pantoprazole 40 mg twice daily with little response Patient admits/denies dysphagia Patient admits/denies nausea, vomiting, or weight loss Patient taking Zofran  Patient admits/denies altered bowel habits Patient admits/denies abdominal pain Patient admits/denies rectal bleeding   Denies/Admits alcohol Denies/Admits smoking Denies/Admits NSAID use. Denies/Admits they are on blood thinners. Patient on Eliquis 5 mg daily for A-fib  Patients last colonoscopy Patients last EGD  Patient's family history includes  Wt Readings from Last 3 Encounters:  10/08/23 158 lb (71.7 kg)  10/03/23 158 lb (71.7 kg)  09/24/23 157 lb (71.2 kg)      Past Medical History:  Diagnosis Date   Bundle branch block, left    Dyslipidemia    GERD (gastroesophageal reflux disease)    Hypercholesteremia    Hypertension    LBBB (left bundle branch block)    Paroxysmal atrial fibrillation (HCC)    UTI (urinary tract infection)     Past Surgical History:  Procedure Laterality Date   bladder polyp removal     BREAST EXCISIONAL BIOPSY Right    60 yrs ago- Benign   BREAST LUMPECTOMY Left    No Visible Scar, said 30 + years ago in situ, no chemo, no radiation, or hormone  replacement     CARDIOVERSION N/A 07/30/2018   Procedure: CARDIOVERSION;  Surgeon: Jake Bathe, MD;  Location: [facility] ENDOSCOPY;  Service: Cardiovascular;  Laterality: N/A;   CARDIOVERSION N/A 11/28/2021   Procedure: CARDIOVERSION;  Surgeon: Marinus Maw, MD;  Location: [facility] INVASIVE CV LAB;  Service: Cardiovascular;  Laterality: N/A;   CESAREAN SECTION     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   Procedure: PACEMAKER IMPLANT;  Surgeon: Marinus Maw, MD;  Location: [facility] INVASIVE CV LAB;  Service: Cardiovascular;  Laterality: N/A;    Current Outpatient Medications  Medication Sig Dispense Refill   acetaminophen (ACETAMINOPHEN 8 HOUR) 650 MG CR tablet Take 650 mg by mouth as needed for pain (right knee pain).     apixaban (ELIQUIS) 5 MG TABS tablet TAKE 1 TABLET BY MOUTH TWICE DAILY 180 tablet 1   Cholecalciferol (VITAMIN D3) 50 MCG (2000 UT) TABS Take 2,000 Units by mouth daily.     diltiazem (CARDIZEM CD) 120 MG 24 hr capsule Take 1 capsule (120 mg total) by mouth daily. 90 capsule 3   losartan (COZAAR) 100 MG tablet Take 1 tablet (100 mg total) by mouth daily. 90 tablet 3   metoprolol tartrate (LOPRESSOR) 50 MG tablet Take 1 tablet (50 mg total) by mouth 2 (two) times daily. 180 tablet 0   ondansetron (ZOFRAN) 4 MG tablet Take 1 tablet (4 mg total) by mouth every 8 (eight) hours as needed for nausea or vomiting. 20 tablet 0   pantoprazole (PROTONIX) 40 MG tablet Take 1 tablet (40 mg total) by mouth 2 (two) times daily. 180 tablet 0   No  current facility-administered medications for this visit.    Allergies as of 10/28/2023 - Review Complete 10/08/2023  Allergen Reaction Noted   Codeine Other (See Comments) and Nausea And Vomiting 05/01/2015   Fosamax [alendronate sodium] Other (See Comments) 10/17/2017   Lisinopril Cough 10/17/2017   Olmesartan Other (See Comments) 10/17/2017   Amlodipine Swelling 10/17/2017    Family History  Problem Relation Age of Onset   Breast cancer Mother    Hip  fracture Mother    Ulcers Father    CVA Brother    Renal cancer Brother    Other Brother        hip replacement   CVA Brother        Had PaceMaker   Crohn's disease Brother    Diabetes Brother     Review of Systems:    Constitutional: No weight loss, fever, chills, weakness or fatigue HEENT: Eyes: No change in vision               Ears, Nose, Throat:  No change in hearing or congestion Skin: No rash or itching Cardiovascular: No chest pain, chest pressure or palpitations   Respiratory: No SOB or cough Gastrointestinal: See HPI and otherwise negative Genitourinary: No dysuria or change in urinary frequency Neurological: No headache, dizziness or syncope Musculoskeletal: No new muscle or joint pain Hematologic: No bleeding or bruising Psychiatric: No history of depression or anxiety    Physical Exam:  Vital signs: There were no vitals taken for this visit.  Constitutional:   Pleasant Caucasian female*** appears to be in NAD, Well developed, Well nourished, alert and cooperative Head:  Normocephalic and atraumatic. Eyes:   PEERL, EOMI. No icterus. Conjunctiva pink. Ears:  Normal auditory acuity. Neck:  Supple Throat: Oral cavity and pharynx without inflammation, swelling or lesion.  Respiratory: Respirations even and unlabored. Lungs clear to auscultation bilaterally.   No wheezes, crackles, or rhonchi.  Cardiovascular: Normal S1, S2. Regular rate and rhythm. No peripheral edema, cyanosis or pallor.  Gastrointestinal:  Soft, nondistended, nontender. No rebound or guarding. Normal bowel sounds. No appreciable masses or hepatomegaly. Rectal:  Not performed.  Anoscopy: Msk:  Symmetrical without gross deformities. Without edema, no deformity or joint abnormality.  Neurologic:  Alert and  oriented x4;  grossly normal neurologically.  Skin:   Dry and intact without significant lesions or rashes. Psychiatric: Oriented to person, place and time. Demonstrates good judgement and  reason without abnormal affect or behaviors.  RELEVANT LABS AND IMAGING: CBC    Latest Ref Rng & Units 09/08/2023   10:33 AM 09/07/2023    7:24 PM 03/26/2023    9:04 AM  CBC  WBC 4.0 - 10.5 K/uL 11.6  12.8  8.6   Hemoglobin 12.0 - 15.0 g/dL 16.1  09.6  04.5   Hematocrit 36.0 - 46.0 % 38.8  34.3  37.6   Platelets 150 - 400 K/uL 322  301  281.0      CMP     Latest Ref Rng & Units 09/08/2023   10:33 AM 09/07/2023    7:24 PM 03/26/2023    9:04 AM  CMP  Glucose 70 - 99 mg/dL 98  409  811   BUN 8 - 23 mg/dL 22  28  22    Creatinine 0.44 - 1.00 mg/dL 9.14  7.82  9.56   Sodium 135 - 145 mmol/L 138  135  137   Potassium 3.5 - 5.1 mmol/L 4.7  3.8  4.7   Chloride 98 - 111  mmol/L 106  102  103   CO2 22 - 32 mmol/L 23  22  27    Calcium 8.9 - 10.3 mg/dL 78.2  9.7  95.6   Total Protein 6.5 - 8.1 g/dL 6.9   7.2   Total Bilirubin <1.2 mg/dL 0.8   0.7   Alkaline Phos 38 - 126 U/L 61   60   AST 15 - 41 U/L 21   18   ALT 0 - 44 U/L 14   13      Lab Results  Component Value Date   TSH 2.201 09/08/2023   09/09/2023 echo   Assessment: 1. ***  Plan: 1. ***   Thank you for the courtesy of this consult. Please call me with any questions or concerns.   Legna Mausolf, FNP-C [facility] 10/25/2023, 4:41 PM  Cc: Georgina Quint, *

## 2023-10-28 ENCOUNTER — Encounter: Payer: Self-pay | Admitting: Gastroenterology

## 2023-10-28 ENCOUNTER — Ambulatory Visit: Payer: Medicare Other | Admitting: Gastroenterology

## 2023-10-28 VITALS — BP 138/66 | HR 61 | Ht 65.0 in | Wt 157.0 lb

## 2023-10-28 DIAGNOSIS — K219 Gastro-esophageal reflux disease without esophagitis: Secondary | ICD-10-CM | POA: Diagnosis not present

## 2023-10-28 DIAGNOSIS — R110 Nausea: Secondary | ICD-10-CM | POA: Diagnosis not present

## 2023-10-28 DIAGNOSIS — R142 Eructation: Secondary | ICD-10-CM | POA: Diagnosis not present

## 2023-10-28 MED ORDER — PANTOPRAZOLE SODIUM 40 MG PO TBEC: 40.0000 mg | DELAYED_RELEASE_TABLET | Freq: Every day | ORAL | Status: DC

## 2023-10-28 MED ORDER — ONDANSETRON HCL 4 MG PO TABS: 4.0000 mg | ORAL_TABLET | Freq: Three times a day (TID) | ORAL | 1 refills | Status: DC | PRN

## 2023-10-28 NOTE — Patient Instructions (Signed)
Decrease your pantoprazole 40 mg to once daily.  We have sent the following medications to your pharmacy for you to pick up at your convenience: ondansetron.  You have been given a GERD handout.  Follow up with Dr. Adela Lank on 01/21/24 at 10:30 am.   _______________________________________________________  If your blood pressure at your visit was 140/90 or greater, please contact your primary care physician to follow up on this.  _______________________________________________________  If you are age 82 or older, your body mass index should be between 23-30. Your Body mass index is 26.13 kg/m. If this is out of the aforementioned range listed, please consider follow up with your Primary Care Provider.  If you are age 74 or younger, your body mass index should be between 19-25. Your Body mass index is 26.13 kg/m. If this is out of the aformentioned range listed, please consider follow up with your Primary Care Provider.   ________________________________________________________  The [facility] providers would like to encourage you to use [facility] to communicate with providers for non-urgent requests or questions.  Due to long hold times on the telephone, sending your provider a message by [facility] may be a faster and more efficient way to get a response.  Please allow 48 business hours for a response.  Please remember that this is for non-urgent requests.  _______________________________________________________

## 2023-10-28 NOTE — Progress Notes (Signed)
 Agree with assessment and plan as outlined.

## 2023-11-15 NOTE — Progress Notes (Signed)
 Cardiology Office Note:    Date:  11/15/2023   ID:  Lisa [name], DOB 14-Aug-1941, MRN [id number]  PCP:  Georgina Quint, MD [facility] Cardiologist: Edwen Mclester [place], MD   Reason for visit: Follow-up Afib and HTN  History of Present Illness:    Lisa [name] is a 83 y.o. female with a hx of atrial fibrillation s/p DCCV in 07/2018 (sx SOB, swelling, indigestion), hypertension, hyperlipidemia, left bundle branch block.   Myoview in January 2019 was normal.   Z  She was seen in ED at [facility] in Jan 2023. She had an episode of syncope at home. Following this she had acute N/V and diarrhea.  Ecg showed NSR with chronic LBBB. BP was quite high to 202/110. Labs including troponins were negative. She hasn't had any further dizziness or syncope but has persistent elevated BP.  She was on metoprolol, losartan and HCTZ.  When seen in Feb she was in NSR. Later presented in March with recurrent Afib. She underwent successful DCCV. 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 She was admitted and underwent placement of dual chamber pacemaker by Dr Ladona Ridgel. Echo was unremarkable. She was DC on metoprolol and Eliquis. Prior renal artery duplex was negative for RAS.  I saw her on Dec 3 and she was doing well. She was admitted overnight on 12/15 for palpitations. Pacer interrogation showed Afib with RVR. She converted. Had atypical chest pain. Minimal troponin leak metoprolol was increased to 50 mg bid and losartan and HCT held. I saw her on 12/26 and she was doing ok. BP was high. Started on low dose hydralazine.  Seen in EP office on 12/31 and was in NSR then.   She was seen back with symptoms of dizziness, HA and hypotension. We stopped hydralazine and switched her to diltiazem.  On follow up today she states she feels the best she has in 3 months. Dizziness has resolved. No chest pain. Notes GERD has improved. Still has some  palpitations lasting < 30 minutes. Is seeing GI in April and may consider an EGD. Also planning to have multiple dental extractions done.BP readings at home range from 121 to 153 systolic.      Past Medical History:  Diagnosis Date   Bundle branch block, left    Dyslipidemia    GERD (gastroesophageal reflux disease)    Hypercholesteremia    Hypertension    LBBB (left bundle branch block)    Paroxysmal atrial fibrillation (HCC)    UTI (urinary tract infection)     Past Surgical History:  Procedure Laterality Date   bladder polyp removal     BREAST EXCISIONAL BIOPSY Right    60 yrs ago- Benign   BREAST LUMPECTOMY Left    No Visible Scar, said 30 + years ago in situ, no chemo, no radiation, or hormone replacement     CARDIOVERSION N/A 07/30/2018   Procedure: CARDIOVERSION;  Surgeon: Jake Bathe, MD;  Location: [facility] ENDOSCOPY;  Service: Cardiovascular;  Laterality: N/A;   CARDIOVERSION N/A 11/28/2021   Procedure: CARDIOVERSION;  Surgeon: Marinus Maw, MD;  Location: [facility] INVASIVE CV LAB;  Service: Cardiovascular;  Laterality: N/A;   CESAREAN SECTION     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   Procedure: PACEMAKER IMPLANT;  Surgeon: Marinus Maw, MD;  Location: [facility] INVASIVE CV LAB;  Service: Cardiovascular;  Laterality: N/A;    Current Medications: No outpatient medications have  been marked as taking for the 11/19/23 encounter (Appointment) with [place], Rhyan Wolters M, MD.     Allergies:   Codeine, Fosamax [alendronate sodium], Lisinopril, Olmesartan, and Amlodipine   Social History   Socioeconomic History   Marital status: Married    Spouse name: Not on file   Number of children: 2   Years of education: Not on file   Highest education level: Associate degree: occupational, Scientist, product/process development, or vocational program  Occupational History   Not on file  Tobacco Use   Smoking status: Former    Current packs/day: 0.00    Types: Cigarettes    Start date: 67    Quit date: 1993    Years since  quitting: 32.1   Smokeless tobacco: Never  Vaping Use   Vaping status: Never Used  Substance and Sexual Activity   Alcohol use: Yes   Drug use: No   Sexual activity: Not on file  Other Topics Concern   Not on file  Social History Narrative   Not on file   Social Drivers of Health   Financial Resource Strain: Low Risk  (10/07/2023)   Overall Financial Resource Strain (CARDIA)    Difficulty of Paying Living Expenses: Not hard at all  Food Insecurity: No Food Insecurity (10/07/2023)   Hunger Vital Sign    Worried About Running Out of Food in the Last Year: Never true    Ran Out of Food in the Last Year: Never true  Transportation Needs: No Transportation Needs (10/07/2023)   PRAPARE - Administrator, Civil Service (Medical): No    Lack of Transportation (Non-Medical): No  Physical Activity: Sufficiently Active (10/07/2023)   Exercise Vital Sign    Days of Exercise per Week: 5 days    Minutes of Exercise per Session: 60 min  Stress: Stress Concern Present (10/07/2023)   [employer] of Occupational Health - Occupational Stress Questionnaire    Feeling of Stress : To some extent  Social Connections: Unknown (10/07/2023)   Social Connection and Isolation Panel [NHANES]    Frequency of Communication with Friends and Family: More than three times a week    Frequency of Social Gatherings with Friends and Family: Three times a week    Attends Religious Services: Patient declined    Active Member of Clubs or Organizations: No    Attends Banker Meetings: Never    Marital Status: Married     Family History: The patient's family history includes Breast cancer in her mother; CVA in her brother and brother; Crohn's disease in her brother; Diabetes in her brother; Hip fracture in her mother; Other in her brother; Renal cancer in her brother; Ulcers in her father.  ROS:   Please see the history of present illness.     EKGs/Labs/Other Studies Reviewed:         Recent Labs: 09/07/2023: B Natriuretic Peptide 157.3 09/08/2023: ALT 14; BUN 22; Creatinine, Ser 1.23; Hemoglobin 12.8; Magnesium 1.7; Platelets 322; Potassium 4.7; Sodium 138; TSH 2.201   Recent Lipid Panel Lab Results  Component Value Date/Time   CHOL 210 (H) 03/26/2023 09:04 AM   TRIG 123.0 03/26/2023 09:04 AM   HDL 52.40 03/26/2023 09:04 AM   LDLCALC 133 (H) 03/26/2023 09:04 AM    Dated 06/03/20: cholesterol 159, triglycerides 94, HDL 59, LDL 83. Dated 06/15/21: A1c 6.3, Hgb 12.5. chemistries and TSH normal. Dated 11/15/21:A1c 6.3%   Echo 11/30/21: IMPRESSIONS     1. Left ventricular ejection fraction, by estimation, is  60 to 65%. Left  ventricular ejection fraction by 3D volume is 66 %. The left ventricle has  normal function. The left ventricle has no regional wall motion  abnormalities. Left ventricular diastolic   parameters are consistent with Grade III diastolic dysfunction  (restrictive). Elevated left ventricular end-diastolic pressure.   2. Right ventricular systolic function is normal. The right ventricular  size is mildly enlarged. There is normal pulmonary artery systolic  pressure. The estimated right ventricular systolic pressure is 23.4 mmHg.   3. Left atrial size was moderately dilated.   4. Right atrial size was mildly dilated.   5. The mitral valve is normal in structure. No evidence of mitral valve  regurgitation. No evidence of mitral stenosis. Severe mitral annular  calcification.   6. The aortic valve is tricuspid. Aortic valve regurgitation is not  visualized. Aortic valve sclerosis/calcification is present, without any  evidence of aortic stenosis.   7. Aortic dilatation noted. There is borderline dilatation of the  ascending aorta, measuring 37 mm.   8. The inferior vena cava is normal in size with greater than 50%  respiratory variability, suggesting right atrial pressure of 3 mmHg.   Physical Exam:    VS:  There were no vitals taken for this  visit.   No data found. I repeat BP and it was 134/80  Wt Readings from Last 3 Encounters:  10/28/23 157 lb (71.2 kg)  10/08/23 158 lb (71.7 kg)  10/03/23 158 lb (71.7 kg)     GEN:  Well nourished, well developed in no acute distress HEENT: Normal NECK: No JVD; No carotid bruits CARDIAC: RRR, no murmurs, rubs, gallops RESPIRATORY:  Clear to auscultation without rales, wheezing or rhonchi  ABDOMEN: Soft, non-tender, non-distended MUSCULOSKELETAL: No edema; No deformity  SKIN: Warm and dry NEUROLOGIC:  Alert and oriented PSYCHIATRIC:  Normal affect     ASSESSMENT AND PLAN     HTN - Essential. See above.  -will continue losartan 100 mg, metoprolol  50 mg bid, diltiazem 120 mg daily. I am happy with her current BP readings. Need to allow more permissive BP control to avoid hypotension and dizziness.  - asked keep BP readings.   - Dash diet. - continue to monitor at home  2. Paroxysmal atrial fibrillation -Status post DCCV in November 2019 and again last year in Feb -Echo showed mild LV dysfunction with dyssynergy due to LBBB. Moderate LAE.  - Now in NSR -Continue Eliquis for stroke prevention and higher metoprolol dose  for rate control.   - last Echo showed normal LV function - if she does have more frequent Afib may need to consider AAD therapy - if she does have EGD or multiple dental extractions would be OK to hold Eliquis for 2 days prior.   3. LBBB chronic.   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   5. HLD. Intolerant to statins.  No known history of vascular disease. Will focus on lifestyle modification for now.   6. GERD. Improved symptoms today. On protonix once a day.    Disposition - Follow-up in 4 months         Medication Adjustments/Labs and Tests Ordered: Current medicines are reviewed at length with the patient today.  Concerns regarding medicines are outlined above.  No orders of the  defined types were placed in this encounter.  No orders of the defined types were placed in this encounter.   There are no Patient Instructions  on file for this visit.   Signed, Perrie Ragin [place], MD  11/15/2023 4:31 PM    [facility]

## 2023-11-19 ENCOUNTER — Ambulatory Visit: Payer: Medicare Other | Attending: Cardiology | Admitting: Cardiology

## 2023-11-19 ENCOUNTER — Encounter: Payer: Self-pay | Admitting: Cardiology

## 2023-11-19 VITALS — BP 164/78 | HR 64 | Ht 65.0 in | Wt 158.0 lb

## 2023-11-19 DIAGNOSIS — I10 Essential (primary) hypertension: Secondary | ICD-10-CM | POA: Diagnosis not present

## 2023-11-19 DIAGNOSIS — R42 Dizziness and giddiness: Secondary | ICD-10-CM | POA: Diagnosis not present

## 2023-11-19 DIAGNOSIS — I442 Atrioventricular block, complete: Secondary | ICD-10-CM | POA: Diagnosis not present

## 2023-11-19 DIAGNOSIS — I480 Paroxysmal atrial fibrillation: Secondary | ICD-10-CM | POA: Diagnosis not present

## 2023-11-19 MED ORDER — METOPROLOL TARTRATE 50 MG PO TABS: 50.0000 mg | ORAL_TABLET | Freq: Two times a day (BID) | ORAL | 3 refills | Status: DC

## 2023-11-19 MED ORDER — APIXABAN 5 MG PO TABS: 5.0000 mg | ORAL_TABLET | Freq: Two times a day (BID) | ORAL | 0 refills | Status: DC

## 2023-11-19 MED ORDER — PANTOPRAZOLE SODIUM 40 MG PO TBEC: 40.0000 mg | DELAYED_RELEASE_TABLET | Freq: Every day | ORAL | 3 refills | Status: DC

## 2023-11-19 MED ORDER — DILTIAZEM HCL ER COATED BEADS 120 MG PO CP24: 120.0000 mg | ORAL_CAPSULE | Freq: Every day | ORAL | 3 refills | Status: DC

## 2023-11-19 NOTE — Patient Instructions (Addendum)
 Medication Instructions:  Continue same medications *If you need a refill on your cardiac medications before your next appointment, please call your pharmacy*   Lab Work: None ordered   Testing/Procedures: None ordered   Follow-Up: At [facility], you and your health needs are our priority.  As part of our continuing mission to provide you with exceptional heart care, we have created designated Provider Care Teams.  These Care Teams include your primary Cardiologist (physician) and Advanced Practice Providers (APPs -  Physician Assistants and Nurse Practitioners) who all work together to provide you with the care you need, when you need it.  We recommend signing up for the patient portal called "MyChart".  Sign up information is provided on this After Visit Summary.  MyChart is used to connect with patients for Virtual Visits (Telemedicine).  Patients are able to view lab/test results, encounter notes, upcoming appointments, etc.  Non-urgent messages can be sent to your provider as well.   To learn more about what you can do with MyChart, go to [email or web address].    Your next appointment:  4 months  Monday 6/16 at 1:20 pm    Provider:  Dr.Jordan

## 2023-11-19 NOTE — Addendum Note (Signed)
 Addended by: Neoma Laming on: 11/19/2023 02:17 PM   Modules accepted: Orders

## 2023-11-25 ENCOUNTER — Encounter: Payer: Self-pay | Admitting: Internal Medicine

## 2023-12-03 ENCOUNTER — Ambulatory Visit: Payer: Medicare Other

## 2023-12-03 ENCOUNTER — Encounter: Payer: Self-pay | Admitting: Internal Medicine

## 2023-12-03 DIAGNOSIS — I442 Atrioventricular block, complete: Secondary | ICD-10-CM | POA: Diagnosis not present

## 2023-12-03 LAB — CUP PACEART REMOTE DEVICE CHECK
Battery Voltage: 85
Date Time Interrogation Session: 20250311105736
Implantable Lead Connection Status: 753985
Implantable Lead Connection Status: 753985
Implantable Lead Implant Date: 20230313
Implantable Lead Implant Date: 20230313
Implantable Lead Location: 753859
Implantable Lead Location: 753860
Implantable Lead Model: 377171
Implantable Lead Model: 377171
Implantable Lead Serial Number: 7000407809
Implantable Lead Serial Number: 8000780983
Implantable Pulse Generator Implant Date: 20230313
Pulse Gen Model: 407145
Pulse Gen Serial Number: 70364045

## 2023-12-11 ENCOUNTER — Encounter: Payer: Self-pay | Admitting: Cardiology

## 2023-12-11 NOTE — Telephone Encounter (Signed)
 Spoke to patient she stated she feels awful,for the past 2 weeks since she started taking Diltiazem she is sob, both ankles swollen,weak no energy.B/P 150 to 160 /88 to 90.Yesterday  144/84.Pulse 70's.She wanted to ask Dr.Jordan if she needs to restart hydrochlorothiazide.Advised Dr.Jordan is out of office this afternoon.I will send message to him.

## 2023-12-11 NOTE — Telephone Encounter (Signed)
 See other My Chart encounter re: this.... will close this encounter.

## 2023-12-11 NOTE — Telephone Encounter (Signed)
 From Dr Sw[place] 11/19/23: She was seen back with symptoms of dizziness, HA and hypotension. We stopped hydralazine and switched her to diltiazem.    Pt sent in a my chart stating she has been having many symptoms since starting the Diltiazem:  side effects since my taking of Diltiazem started on 09/28/2023 Shortness of breath, 2 Nose Bleeds, Cough, swollen Ankles and Headaches.  She says he read this online and she thinks she needs to go off of it but her BP has been high:   114/84, 150/91... these have been consistent readings over several days.  I will send to Dr Jewel Baize for review.

## 2023-12-12 MED ORDER — HYDROCHLOROTHIAZIDE 12.5 MG PO CAPS: 12.5000 mg | ORAL_CAPSULE | Freq: Every day | ORAL | 3 refills | Status: DC

## 2023-12-12 NOTE — Telephone Encounter (Signed)
 Pt is calling back to check the status of this message

## 2023-12-12 NOTE — Telephone Encounter (Signed)
 Yes we can resume hydrochlorothiazide at low dose 12.5 mg daily for now and monitor    Peter [place] MD, [facility]

## 2023-12-12 NOTE — Telephone Encounter (Signed)
 Spoke to patient . Patient aware to start taking hydrochlorothiazide 12.5 mg  capsule daily.  Prescription e-sent to pharmacy for 12.5 mg capsule 90 supply with 3 refills  Patient verbalized understanding

## 2023-12-12 NOTE — Addendum Note (Signed)
 Addended by: Tobin Chad on: 12/12/2023 01:44 PM   Modules accepted: Orders

## 2023-12-13 ENCOUNTER — Other Ambulatory Visit: Payer: Self-pay | Admitting: Emergency Medicine

## 2023-12-13 NOTE — Telephone Encounter (Signed)
 Copied from CRM [id number]. Topic: Clinical - Medication Refill >> Dec 13, 2023 12:36 PM Elizebeth Brooking wrote: Most Recent Primary Care Visit:  Provider: Georgina Quint  Department: [facility] [place]  Visit Type: OFFICE VISIT  Date: 10/08/2023  Medication: pantoprazole (PROTONIX) 40 MG tablet  Has the patient contacted their pharmacy? Yes (Agent: If no, request that the patient contact the pharmacy for the refill. If patient does not wish to contact the pharmacy document the reason why and proceed with request.) (Agent: If yes, when and what did the pharmacy advise?)  Is this the correct pharmacy for this prescription? Yes If no, delete pharmacy and type the correct one.  This is the patient's preferred pharmacy:   [facility] [address] [place] - [address] [place] [address] Phone: [phone] Fax: [phone]   Has the prescription been filled recently? No  Is the patient out of the medication? Yes  Has the patient been seen for an appointment in the last year OR does the patient have an upcoming appointment? Yes  Can we respond through MyChart? Yes  Agent: Please be advised that Rx refills may take up to 3 business days. We ask that you follow-up with your pharmacy.

## 2023-12-16 ENCOUNTER — Other Ambulatory Visit: Payer: Self-pay | Admitting: Emergency Medicine

## 2023-12-16 NOTE — Telephone Encounter (Signed)
 Copied from CRM [id number]. Topic: Clinical - Medication Refill >> Dec 16, 2023 10:57 AM Adele Barthel wrote: Most Recent Primary Care Visit:  Provider: Georgina Quint  Department: [facility]  Visit Type: OFFICE VISIT  Date: 10/08/2023  Medication: pantoprazole (PROTONIX) 40 MG tablet  Has the patient contacted their pharmacy? Yes (Agent: If no, request that the patient contact the pharmacy for the refill. If patient does not wish to contact the pharmacy document the reason why and proceed with request.) (Agent: If yes, when and what did the pharmacy advise?)  Is this the correct pharmacy for this prescription? Yes If no, delete pharmacy and type the correct one.  This is the patient's preferred pharmacy:   [facility] [address] [place] - [address] [place] [address] Phone: [phone] Fax: [phone]   Has the prescription been filled recently? Yes  Is the patient out of the medication? No, has enough until 03/27  Has the patient been seen for an appointment in the last year OR does the patient have an upcoming appointment? Yes  Can we respond through MyChart? Yes  Agent: Please be advised that Rx refills may take up to 3 business days. We ask that you follow-up with your pharmacy.

## 2023-12-17 ENCOUNTER — Other Ambulatory Visit: Payer: Self-pay | Admitting: Emergency Medicine

## 2023-12-17 NOTE — Telephone Encounter (Signed)
 Patient called and advised pantoprazole was last refilled by Dr. Swaziland, Cardiologist. She says the last time she got it was when she was in the hospital and that she's at the pharmacy now and they don't have a refill on file. She asked if Dr. Terence Lux would just refill it for her since it's for GERD and not her heart, she has 1 day left then will be out. Advised I will send this to Dr. Terence Lux for refill and it could take up to 3 days for refills. She verbalized understanding.   Copied from CRM [id number]. Topic: Clinical - Prescription Issue >> Dec 17, 2023  9:51 AM Lisa [name] wrote: Reason for CRM: Patient has 2 pills left of pantoprazole (PROTONIX) 40 MG tablet  and after today she will not have anymore after today. She stated that it is for her indigestion. Patient is requesting a callback.

## 2023-12-18 MED ORDER — PANTOPRAZOLE SODIUM 40 MG PO TBEC: DELAYED_RELEASE_TABLET | ORAL | 2 refills | Status: DC

## 2023-12-23 ENCOUNTER — Other Ambulatory Visit: Payer: Self-pay | Admitting: Cardiology

## 2023-12-23 NOTE — Telephone Encounter (Signed)
 Prescription refill request for Eliquis received. Indication:afib Last office visit:2/25 Scr:1.23  12/24 Age: 83 Weight:71.7  kg  Prescription refilled

## 2024-01-20 NOTE — Progress Notes (Signed)
 Remote pacemaker transmission.

## 2024-01-21 ENCOUNTER — Ambulatory Visit: Payer: Medicare Other | Admitting: Gastroenterology

## 2024-01-21 ENCOUNTER — Encounter: Payer: Self-pay | Admitting: Gastroenterology

## 2024-01-21 VITALS — BP 120/60 | HR 69 | Ht 65.0 in | Wt 152.0 lb

## 2024-01-21 DIAGNOSIS — R110 Nausea: Secondary | ICD-10-CM | POA: Diagnosis not present

## 2024-01-21 DIAGNOSIS — R634 Abnormal weight loss: Secondary | ICD-10-CM | POA: Diagnosis not present

## 2024-01-21 DIAGNOSIS — K219 Gastro-esophageal reflux disease without esophagitis: Secondary | ICD-10-CM | POA: Diagnosis not present

## 2024-01-21 DIAGNOSIS — R142 Eructation: Secondary | ICD-10-CM

## 2024-01-21 DIAGNOSIS — Z79899 Other long term (current) drug therapy: Secondary | ICD-10-CM

## 2024-01-21 NOTE — Patient Instructions (Addendum)
 You have been scheduled for a CT scan of the abdomen and pelvis at [facility], 1st floor Radiology. You are scheduled on May 7th at 12:30pm. You should arrive 15 minutes prior to your appointment time for registration.    Plan on being there 30 to 60 minutes, depending on the type of exam you are having performed.   If you have any questions regarding your exam or if you need to reschedule, you may call [facility] Radiology Scheduling at [phone] between the hours of 8:00 am and 5:00 pm, Monday-Friday.   Continue Zofran  and pantoprazole .  Avoid carbonated beverages to decrease belching.  Thank you for entrusting me with your care and for choosing [facility], Dr. Alvester Johnson    If your blood pressure at your visit was 140/90 or greater, please contact your primary care physician to follow up on this. ______________________________________________________  If you are age 82 or older, your body mass index should be between 23-30. Your Body mass index is 25.29 kg/m. If this is out of the aforementioned range listed, please consider follow up with your Primary Care Provider.  If you are age 82 or younger, your body mass index should be between 19-25. Your Body mass index is 25.29 kg/m. If this is out of the aformentioned range listed, please consider follow up with your Primary Care Provider.  ________________________________________________________  The [facility] providers would like to encourage you to use MYCHART to communicate with providers for non-urgent requests or questions.  Due to long hold times on the telephone, sending your provider a message by [facility] may be a faster and more efficient way to get a response.  Please allow 48 business hours for a response.  Please remember that this is for non-urgent requests.  _______________________________________________________  Due to recent changes in healthcare laws, you may see the results of your imaging  and laboratory studies on MyChart before your provider has had a chance to review them.  We understand that in some cases there may be results that are confusing or concerning to you. Not all laboratory results come back in the same time frame and the provider may be waiting for multiple results in order to interpret others.  Please give us  48 hours in order for your provider to thoroughly review all the results before contacting the office for clarification of your results.

## 2024-01-21 NOTE — Progress Notes (Signed)
 HPI :  83 year old female here for a follow-up visit for nausea, GERD, belching, weight loss.  Recall she has history of stage III CKD, A-fib on Eliquis , left bundle branch block, pacemaker.  She was seen by [facility] May in February of this past year for symptoms of nausea, reflux, belching.  She reports having symptoms of chronic nausea for more than 20 years.  She does not have much of any vomiting but tends to wake up in the morning feeling nauseated and then it is gone the rest of the day.  She has not had much pyrosis or typical reflux symptoms, she can have this at times, has been on longstanding PPI for these issues from what she tells me.  She has had belching that bothers her periodically throughout the day.  She states if she is yawning or if she does not eat enough she will often belch.  She reminds me she had was having issues with A-fib earlier in the year, in December, symptoms were pretty bad around that time and then over the past few months as her A-fib has been better controlled, her symptoms have not bothered her as much as they have.  She does drink a few carbonated beverages every day.  She denies any dysphagia.  No vomiting.  She does endorse loss of at least 20 pounds since the new year and continues to lose weight despite seemingly eating okay, her appetite is down, husband states she does appear to be eating normally however.  Her A-fib is much better controlled, on metoprolol  and diltiazem .  She has been on PPI for years.  She does have CKD, no osteoporosis.  She was able to wean down Protonix  from 80 mg daily to 40 mg daily.  She is not needing to take Zofran  much, it helps her when she does take it.  In recent months she is actually felt better in regards to her nausea and symptoms, other than the weight loss which is new.  She has never had an endoscopy.  No family history of esophageal cancer.  She has had a colonoscopy about 15 years ago which she thinks was normal.  Denies  blood in her stools, no problems with her bowels that bother her.  We discussed her symptoms and how aggressive she want to be with her workup.  Last labs in January, no anemia.    Past Medical History:  Diagnosis Date   Bundle branch block, left    Dyslipidemia    GERD (gastroesophageal reflux disease)    Hypercholesteremia    Hypertension    LBBB (left bundle branch block)    Pacemaker    Paroxysmal atrial fibrillation (HCC)    UTI (urinary tract infection)      Past Surgical History:  Procedure Laterality Date   bladder polyp removal     BREAST EXCISIONAL BIOPSY Right    60 yrs ago- Benign   BREAST LUMPECTOMY Left    No Visible Scar, said 30 + years ago in situ, no chemo, no radiation, or hormone replacement     CARDIOVERSION N/A 07/30/2018   Procedure: CARDIOVERSION;  Surgeon: Hugh Madura, MD;  Location: [facility] ENDOSCOPY;  Service: Cardiovascular;  Laterality: N/A;   CARDIOVERSION N/A 11/28/2021   Procedure: CARDIOVERSION;  Surgeon: Tammie Fall, MD;  Location: [facility] INVASIVE CV LAB;  Service: Cardiovascular;  Laterality: N/A;   CESAREAN SECTION     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   Procedure: PACEMAKER IMPLANT;  Surgeon: Manya Sells  W, MD;  Location: [facility] INVASIVE CV LAB;  Service: Cardiovascular;  Laterality: N/A;   Family History  Problem Relation Age of Onset   Breast cancer Mother    Hip fracture Mother    Ulcers Father    CVA Brother    Renal cancer Brother    Other Brother        hip replacement   CVA Brother        Had PaceMaker   Crohn's disease Brother    Diabetes Brother    Social History   Tobacco Use   Smoking status: Former    Current packs/day: 0.00    Types: Cigarettes    Start date: 1973    Quit date: 1993    Years since quitting: 32.3   Smokeless tobacco: Never  Vaping Use   Vaping status: Never Used  Substance Use Topics   Alcohol use: Yes   Drug use: No   Current Outpatient Medications  Medication Sig Dispense Refill    acetaminophen  (ACETAMINOPHEN  8 HOUR) 650 MG CR tablet Take 650 mg by mouth as needed for pain (right knee pain).     Cholecalciferol (VITAMIN D3) 50 MCG (2000 UT) TABS Take 2,000 Units by mouth daily.     diltiazem  (CARDIZEM  CD) 120 MG 24 hr capsule Take 1 capsule (120 mg total) by mouth daily. 90 capsule 3   ELIQUIS  5 MG TABS tablet TAKE 1 TABLET BY MOUTH TWICE DAILY 180 tablet 1   hydrochlorothiazide  (MICROZIDE ) 12.5 MG capsule Take 1 capsule (12.5 mg total) by mouth daily. 90 capsule 3   losartan  (COZAAR ) 100 MG tablet TAKE 1 TABLET BY MOUTH DAILY GENERIC EQUIVALENT FOR COZAAR  90 tablet 1   metoprolol  tartrate (LOPRESSOR ) 50 MG tablet Take 1 tablet (50 mg total) by mouth 2 (two) times daily. 180 tablet 3   ondansetron  (ZOFRAN ) 4 MG tablet Take 1 tablet (4 mg total) by mouth every 8 (eight) hours as needed for nausea or vomiting. 20 tablet 1   pantoprazole  (PROTONIX ) 40 MG tablet Take 1 tablet by mouth once daily in the am, if symptoms return go back to twice a day 60 tablet 2   No current facility-administered medications for this visit.   Allergies  Allergen Reactions   Codeine Other (See Comments) and Nausea And Vomiting    GI upset Other reaction(s): stomach upset   Fosamax [Alendronate Sodium] Other (See Comments)    Tooth problems   Lisinopril Cough   Olmesartan Other (See Comments)    Possible photodermatitis    Amlodipine Swelling    edema  edema     edema     Review of Systems: All systems reviewed and negative except where noted in HPI.   Lab Results  Component Value Date   WBC 11.6 (H) 09/08/2023   HGB 12.8 09/08/2023   HCT 38.8 09/08/2023   MCV 80.0 09/08/2023   PLT 322 09/08/2023    Lab Results  Component Value Date   NA 138 09/08/2023   CL 106 09/08/2023   K 4.7 09/08/2023   CO2 23 09/08/2023   BUN 22 09/08/2023   CREATININE 1.23 (H) 09/08/2023   GFRNONAA 44 (L) 09/08/2023   CALCIUM  10.2 09/08/2023   ALBUMIN 3.7 09/08/2023   GLUCOSE 98 09/08/2023     Lab Results  Component Value Date   ALT 14 09/08/2023   AST 21 09/08/2023   ALKPHOS 61 09/08/2023   BILITOT 0.8 09/08/2023     Physical Exam: BP 120/60  Pulse 69   Ht 5\' 5"  (1.651 m)   Wt 152 lb (68.9 kg)   BMI 25.29 kg/m  Constitutional: Pleasant,well-developed, female in no acute distress. Neurological: Alert and oriented to person place and time. Psychiatric: Normal mood and affect. Behavior is normal.   ASSESSMENT: 83 y.o. female here for assessment of the following  1. Loss of weight   2. Gastroesophageal reflux disease, unspecified whether esophagitis present   3. Nausea without vomiting   4. Belching   5. Long-term current use of proton pump inhibitor therapy    Longstanding nausea, belching, with some upper tract symptoms.  This seemingly worsened when she was having problems with A-fib and in recent months has been doing much better in this regard with better control of her A-fib, question if related.  She denies any medications that make her feel nauseated otherwise and this been going on for many years.  What is new for her is about 20 pounds of weight loss since the new year, she is not trying to lose weight, appears to be eating appropriately.  She denies any abdominal pains at all.  We discussed these issues at length and how aggressive to work them up given her age and comorbidities.  She does meet criteria for an endoscopy with her weight loss, persistent nausea, history of reflux, belching.  We discussed what this would entail, risks and benefits.  I think we could get her through an EGD safely given her cardiac conditions are stable however we discussed other options.  In regards to her loss of weight, and chronic nausea, also offered her a CT scan abdomen pelvis as she has never had cross-sectional imaging of her abdomen.  We discussed use of contrast, with her CKD, offered her noncontrast CT scan to screen for any high risk or concerning process, rule out  malignancy.  Initially given safety of the CT she wants to start with that.  If negative, she would then be more willing to do an upper endoscopy.  We can do the endoscopy on Eliquis  if needed.  We discussed long-term use of chronic PPIs.  She does have CKD and want to use the lowest dose needed to control symptoms.  She tells me she does not have osteoporosis or osteopenia.  She will continue current dosing of pantoprazole  40 mg daily and try to wean to lowest daily dose needed to control symptoms.  She will continue Zofran  as needed.  I otherwise recommend she avoid all carbonated beverages given her belching.  I reassured her that belching is typically a benign entity and not typically associate with high risk condition, however I am more concerned about her weight loss and need to work that up.   PLAN: - schedule CT scan abdomen / pelvis without IV contrast - consideration for EGD - she meets criteria for this, wishes to do CT first and then EGD if needed (concerned about anesthesia risks), would do on Eliquis  - continue Zofran  PRN - counseled on long term risks of chronic PPI use, use lowest dose needed to control symptoms - continue pantoprazole , use lowest dose needed - avoid carbonated beverages if belching is bothering her - discussed belching at length, reassured them this is typically benign entity  Christi Coward, MD [facility]

## 2024-01-29 ENCOUNTER — Ambulatory Visit (HOSPITAL_COMMUNITY)
Admission: RE | Admit: 2024-01-29 | Discharge: 2024-01-29 | Disposition: A | Discharge status: Home / Self Care | Source: Ambulatory Visit | Attending: Gastroenterology | Admitting: Gastroenterology

## 2024-01-29 DIAGNOSIS — R110 Nausea: Secondary | ICD-10-CM | POA: Insufficient documentation

## 2024-01-29 DIAGNOSIS — R634 Abnormal weight loss: Secondary | ICD-10-CM | POA: Insufficient documentation

## 2024-01-31 ENCOUNTER — Other Ambulatory Visit: Payer: Self-pay | Admitting: *Deleted

## 2024-01-31 DIAGNOSIS — N2889 Other specified disorders of kidney and ureter: Secondary | ICD-10-CM

## 2024-02-19 NOTE — CV Procedure (Signed)
  Device system confirmed to be MRI conditional, with implant date > 6 weeks ago, and no evidence of abandoned or epicardial leads in review of most recent CXR  Device last cleared by EP Provider: Mertha Abrahams  Clearance is good through for 1 year as long as parameters remain stable at time of check. If pt undergoes a cardiac device procedure during that time, they should be re-cleared.   Tachy-therapies to be programmed off if applicable with device back to pre-MRI settings after completion of exam.  [employer] was available remotely to assist in programming recommendations.   Arlys Lamer, RT  02/19/2024 1:38 PM

## 2024-02-25 ENCOUNTER — Encounter (HOSPITAL_COMMUNITY): Payer: Self-pay

## 2024-02-25 ENCOUNTER — Ambulatory Visit (HOSPITAL_COMMUNITY)
Admission: RE | Admit: 2024-02-25 | Discharge: 2024-02-25 | Disposition: A | Discharge status: Home / Self Care | Source: Ambulatory Visit | Attending: Gastroenterology | Admitting: Gastroenterology

## 2024-02-25 ENCOUNTER — Encounter: Payer: Self-pay | Admitting: Gastroenterology

## 2024-02-25 DIAGNOSIS — N2889 Other specified disorders of kidney and ureter: Secondary | ICD-10-CM

## 2024-02-26 ENCOUNTER — Telehealth: Payer: Self-pay | Admitting: Internal Medicine

## 2024-02-26 DIAGNOSIS — K080 Exfoliation of teeth due to systemic causes: Secondary | ICD-10-CM | POA: Diagnosis not present

## 2024-02-26 NOTE — Telephone Encounter (Signed)
 Called patient to advise an MRI will not affect her heart being out of rhythm. Patient voiced understanding and thankful for call.

## 2024-02-26 NOTE — Telephone Encounter (Addendum)
 Scheduled remote transmission received and reviewed.  Alert notification for a long atrial episode detected. Known history of atrial fibrillation.   Outreach made to patient:  Advised the patient that we received a notification from her device that her heart has been out of rhythm for ~ 1 month.  Inquired if she has been experiencing any symptoms in the interim. Per the patient she has had some "racing heart" and some increased SOB with activity.  She states she will sit down to rest and symptoms will improve. She has also had some lower extremity edema that will resolve overnight.   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  She had a CT and was supposed to have an MRI yesterday, but could not complete this due to anxiety. She is rescheduled to have this done next Tuesday with anti-anxiety mediations on board, but is still unsure how she feels about having this done due to her anxiety yesterday.    Medications confirmed: Eliquis  5 mg BID (no missed doses)  Diltiazem  120 mg every day Metoprolol  tartrate 50 mg BID   Follow-up appointment already scheduled for 03/09/24 with Dr. Swaziland.   The patient is aware that I will forward a message to Dr. Swaziland and Dr. Alexandro Ide, NP to provide any further recommendations prior to her follow up appointment on 03/09/24 with Dr. Swaziland. She is aware we will call back once recommendations are received. The patient voices understanding and is agreeable.

## 2024-02-26 NOTE — Telephone Encounter (Signed)
 Routing to close the loop.

## 2024-02-26 NOTE — Telephone Encounter (Signed)
 Attempted to call the patient. No answer- I left a message on her voice mail that per Dr. Swaziland- no changes at this time, follow up as scheduled on 03/09/24. I asked that she call back with any further questions or concerns.

## 2024-02-26 NOTE — Telephone Encounter (Signed)
 Pt called in wanting to know should she go ahead and cancel the MRI until her heart goes back into rhythm

## 2024-02-26 NOTE — Telephone Encounter (Signed)
 Pt returning call. She listened to VM but she asked if she still needs to keep MRI appt on 6/10 or cancel for now. Please advise.

## 2024-02-27 ENCOUNTER — Telehealth: Payer: Self-pay

## 2024-02-27 ENCOUNTER — Other Ambulatory Visit (HOSPITAL_COMMUNITY): Payer: Self-pay

## 2024-02-27 MED ORDER — DIAZEPAM 5 MG PO TABS: 5.0000 mg | ORAL_TABLET | Freq: Once | ORAL | 0 refills | Status: AC

## 2024-02-27 NOTE — Telephone Encounter (Signed)
 Pharmacy Patient Advocate Encounter  Received notification from [facility] Medicare that Prior Authorization for  diazePAM 5MG  tablets has been APPROVED from 02-27-2024 to 02-26-2025   PA #/Case ID/Reference #: [id number]

## 2024-02-27 NOTE — Telephone Encounter (Signed)
 Ace Holder, MD to Me (Selected Message)    02/26/24  6:39 PM Dottie can you send the patient a tablet of Valium 5 mg - take one tablet 30 minutes prior to MRI. Thanks

## 2024-02-27 NOTE — Telephone Encounter (Signed)
 Pharmacy Patient Advocate Encounter   Received notification from CoverMyMeds that prior authorization for diazePAM 5MG  tablets is required/requested.   Insurance verification completed.   The patient is insured through [employer].   Per test claim: PA required; PA submitted to above mentioned insurance via CoverMyMeds Key/confirmation #/EOC [facility] Status is pending

## 2024-02-27 NOTE — Telephone Encounter (Signed)
 Rx sent.

## 2024-03-03 ENCOUNTER — Ambulatory Visit (INDEPENDENT_AMBULATORY_CARE_PROVIDER_SITE_OTHER): Payer: Medicare Other

## 2024-03-03 ENCOUNTER — Ambulatory Visit (HOSPITAL_COMMUNITY)

## 2024-03-03 DIAGNOSIS — I442 Atrioventricular block, complete: Secondary | ICD-10-CM | POA: Diagnosis not present

## 2024-03-03 LAB — CUP PACEART REMOTE DEVICE CHECK
Date Time Interrogation Session: 20250610144931
Implantable Lead Connection Status: 753985
Implantable Lead Connection Status: 753985
Implantable Lead Implant Date: 20230313
Implantable Lead Implant Date: 20230313
Implantable Lead Location: 753859
Implantable Lead Location: 753860
Implantable Lead Model: 377171
Implantable Lead Model: 377171
Implantable Lead Serial Number: 7000407809
Implantable Lead Serial Number: 8000780983
Implantable Pulse Generator Implant Date: 20230313
Pulse Gen Model: 407145
Pulse Gen Serial Number: 70364045

## 2024-03-05 NOTE — Progress Notes (Signed)
 Cardiology Office Note:    Date:  03/05/2024   ID:  [name], DOB 1940-09-30, MRN [id number]  PCP:  Elvira Hammersmith, MD [facility] Cardiologist: Shenandoah Yeats [place], MD   Reason for visit: Follow-up Afib and HTN  History of Present Illness:    PEBBLES ZEIDERS is a 83 y.o. female with a hx of atrial fibrillation s/p DCCV in 07/2018 (sx SOB, swelling, indigestion), hypertension, hyperlipidemia, left bundle branch block.   Myoview  in January 2019 was normal.   Z  She was seen in ED at [facility] in Jan 2023. She had an episode of syncope at home. Following this she had acute N/V and diarrhea.  Ecg showed NSR with chronic LBBB. BP was quite high to 202/110. Labs including troponins were negative. She hasn't had any further dizziness or syncope but has persistent elevated BP.  She was on metoprolol , losartan  and HCTZ.  When seen in Feb she was in NSR. Later presented in March with recurrent Afib. She underwent successful DCCV. 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 She was admitted and underwent placement of dual chamber pacemaker by Dr Carolynne Citron. Echo was unremarkable. She was DC on metoprolol  and Eliquis . Prior renal artery duplex was negative for RAS.  I saw her on Dec 3 and she was doing well. She was admitted overnight on 12/15 for palpitations. Pacer interrogation showed Afib with RVR. She converted. Had atypical chest pain. Minimal troponin leak metoprolol  was increased to 50 mg bid and losartan  and HCT held. I saw her on 12/26 and she was doing ok. BP was high. Started on low dose hydralazine .  Seen in EP office on 12/31 and was in NSR then.   She was seen back with symptoms of dizziness, HA and hypotension. We stopped hydralazine  and switched her to diltiazem .  On follow up today she states she is doing OK. Did note some swelling in her ankles 6 weeks ago. A little SOB going up stairs. Minor palpitations. Noted  on pacer check to have been in Afib since the end of April. Rate controlled. She notes since addition of diltiazem  BP has been doing very well with home readings between 119-144 systolic and 67-77 diastolic.      Past Medical History:  Diagnosis Date   Bundle branch block, left    Dyslipidemia    GERD (gastroesophageal reflux disease)    Hypercholesteremia    Hypertension    LBBB (left bundle branch block)    Pacemaker    Paroxysmal atrial fibrillation (HCC)    UTI (urinary tract infection)     Past Surgical History:  Procedure Laterality Date   bladder polyp removal     BREAST EXCISIONAL BIOPSY Right    60 yrs ago- Benign   BREAST LUMPECTOMY Left    No Visible Scar, said 30 + years ago in situ, no chemo, no radiation, or hormone replacement     CARDIOVERSION N/A 07/30/2018   Procedure: CARDIOVERSION;  Surgeon: Hugh Madura, MD;  Location: [facility] ENDOSCOPY;  Service: Cardiovascular;  Laterality: N/A;   CARDIOVERSION N/A 11/28/2021   Procedure: CARDIOVERSION;  Surgeon: Tammie Fall, MD;  Location: [facility] INVASIVE CV LAB;  Service: Cardiovascular;  Laterality: N/A;   CESAREAN SECTION     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   Procedure: PACEMAKER IMPLANT;  Surgeon: Tammie Fall, MD;  Location: [facility] INVASIVE CV LAB;  Service: Cardiovascular;  Laterality: N/A;  Current Medications: No outpatient medications have been marked as taking for the 03/09/24 encounter (Appointment) with [place], Candence Sease M, MD.     Allergies:   Codeine, Fosamax [alendronate sodium], Lisinopril, Olmesartan, and Amlodipine   Social History   Socioeconomic History   Marital status: Married    Spouse name: Not on file   Number of children: 2   Years of education: Not on file   Highest education level: Associate degree: occupational, Scientist, product/process development, or vocational program  Occupational History   Not on file  Tobacco Use   Smoking status: Former    Current packs/day: 0.00    Types: Cigarettes    Start date: 1973     Quit date: 1993    Years since quitting: 32.4   Smokeless tobacco: Never  Vaping Use   Vaping status: Never Used  Substance and Sexual Activity   Alcohol use: Yes   Drug use: No   Sexual activity: Not on file  Other Topics Concern   Not on file  Social History Narrative   Not on file   Social Drivers of Health   Financial Resource Strain: Low Risk  (10/07/2023)   Overall Financial Resource Strain (CARDIA)    Difficulty of Paying Living Expenses: Not hard at all  Food Insecurity: No Food Insecurity (10/07/2023)   Hunger Vital Sign    Worried About Running Out of Food in the Last Year: Never true    Ran Out of Food in the Last Year: Never true  Transportation Needs: No Transportation Needs (10/07/2023)   PRAPARE - Administrator, Civil Service (Medical): No    Lack of Transportation (Non-Medical): No  Physical Activity: Sufficiently Active (10/07/2023)   Exercise Vital Sign    Days of Exercise per Week: 5 days    Minutes of Exercise per Session: 60 min  Stress: Stress Concern Present (10/07/2023)   [employer] of Occupational Health - Occupational Stress Questionnaire    Feeling of Stress : To some extent  Social Connections: Unknown (10/07/2023)   Social Connection and Isolation Panel    Frequency of Communication with Friends and Family: More than three times a week    Frequency of Social Gatherings with Friends and Family: Three times a week    Attends Religious Services: Patient declined    Active Member of Clubs or Organizations: No    Attends Banker Meetings: Never    Marital Status: Married     Family History: The patient's family history includes Breast cancer in her mother; CVA in her brother and brother; Crohn's disease in her brother; Diabetes in her brother; Hip fracture in her mother; Other in her brother; Renal cancer in her brother; Ulcers in her father.  ROS:   Please see the history of present illness.     EKGs/Labs/Other  Studies Reviewed:        Recent Labs: 09/07/2023: B Natriuretic Peptide 157.3 09/08/2023: ALT 14; BUN 22; Creatinine, Ser 1.23; Hemoglobin 12.8; Magnesium  1.7; Platelets 322; Potassium 4.7; Sodium 138; TSH 2.201   Recent Lipid Panel Lab Results  Component Value Date/Time   CHOL 210 (H) 03/26/2023 09:04 AM   TRIG 123.0 03/26/2023 09:04 AM   HDL 52.40 03/26/2023 09:04 AM   LDLCALC 133 (H) 03/26/2023 09:04 AM    Dated 06/03/20: cholesterol 159, triglycerides 94, HDL 59, LDL 83. Dated 06/15/21: A1c 6.3, Hgb 12.5. chemistries and TSH normal. Dated 11/15/21:A1c 6.3%   Echo 11/30/21: IMPRESSIONS     1. Left ventricular  ejection fraction, by estimation, is 60 to 65%. Left  ventricular ejection fraction by 3D volume is 66 %. The left ventricle has  normal function. The left ventricle has no regional wall motion  abnormalities. Left ventricular diastolic   parameters are consistent with Grade III diastolic dysfunction  (restrictive). Elevated left ventricular end-diastolic pressure.   2. Right ventricular systolic function is normal. The right ventricular  size is mildly enlarged. There is normal pulmonary artery systolic  pressure. The estimated right ventricular systolic pressure is 23.4 mmHg.   3. Left atrial size was moderately dilated.   4. Right atrial size was mildly dilated.   5. The mitral valve is normal in structure. No evidence of mitral valve  regurgitation. No evidence of mitral stenosis. Severe mitral annular  calcification.   6. The aortic valve is tricuspid. Aortic valve regurgitation is not  visualized. Aortic valve sclerosis/calcification is present, without any  evidence of aortic stenosis.   7. Aortic dilatation noted. There is borderline dilatation of the  ascending aorta, measuring 37 mm.   8. The inferior vena cava is normal in size with greater than 50%  respiratory variability, suggesting right atrial pressure of 3 mmHg.   Physical Exam:    VS:  There were  no vitals taken for this visit.   No data found. I repeat BP and it was 134/80  Wt Readings from Last 3 Encounters:  01/21/24 152 lb (68.9 kg)  11/19/23 158 lb (71.7 kg)  10/28/23 157 lb (71.2 kg)     GEN:  Well nourished, well developed in no acute distress HEENT: Normal NECK: No JVD; No carotid bruits CARDIAC: RRR, no murmurs, rubs, gallops RESPIRATORY:  Clear to auscultation without rales, wheezing or rhonchi  ABDOMEN: Soft, non-tender, non-distended MUSCULOSKELETAL: No edema; No deformity  SKIN: Warm and dry NEUROLOGIC:  Alert and oriented PSYCHIATRIC:  Normal affect     ASSESSMENT AND PLAN     HTN - Essential. See above.  -doing much better with addition of low dose diltiazem .  - continue Diltiazem  120 mg daily, hydrochlorothiazide  12.5 mg daily, losartan  100 mg daily and metoprolol  50 mg bid.  - asked keep BP readings.   - Dash diet. - continue to monitor at home  2. Paroxysmal atrial fibrillation -Status post DCCV in November 2019 and again  in Feb 2023 -Echo showed mild LV dysfunction with dyssynergy due to LBBB. Moderate LAE. EF 50%   -Continue Eliquis  for stroke prevention and higher metoprolol  dose  for rate control.  She has not missed any doses.  - she is back in Afib with controlled rate. Mildly symptomatic.  - recommend DCCV. Will arrange. Procedure and risks discussed with her.  - if CV does not hold will refer to EP  3. LBBB chronic.   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   5. HLD. Intolerant to statins.  No known history of vascular disease. Will focus on lifestyle modification for now.               Medication Adjustments/Labs and Tests Ordered: Current medicines are reviewed at length with the patient today.  Concerns regarding medicines are outlined above.  No orders of the defined types were placed in this encounter.  No orders of the defined types were placed in this  encounter.   There are no Patient Instructions on file for this visit.   Signed, Fatim Vanderschaaf [place], MD  03/05/2024 5:00 PM  [facility]

## 2024-03-07 ENCOUNTER — Other Ambulatory Visit: Payer: Self-pay | Admitting: Gastroenterology

## 2024-03-08 ENCOUNTER — Ambulatory Visit: Payer: Self-pay | Admitting: Internal Medicine

## 2024-03-09 ENCOUNTER — Other Ambulatory Visit: Payer: Self-pay | Admitting: Cardiology

## 2024-03-09 ENCOUNTER — Telehealth: Payer: Self-pay

## 2024-03-09 ENCOUNTER — Encounter: Payer: Self-pay | Admitting: Cardiology

## 2024-03-09 ENCOUNTER — Ambulatory Visit: Payer: Medicare Other | Attending: Cardiology | Admitting: Cardiology

## 2024-03-09 VITALS — BP 160/80 | HR 60 | Ht 65.0 in | Wt 154.6 lb

## 2024-03-09 DIAGNOSIS — Z01812 Encounter for preprocedural laboratory examination: Secondary | ICD-10-CM

## 2024-03-09 DIAGNOSIS — I480 Paroxysmal atrial fibrillation: Secondary | ICD-10-CM

## 2024-03-09 DIAGNOSIS — I10 Essential (primary) hypertension: Secondary | ICD-10-CM | POA: Diagnosis not present

## 2024-03-09 DIAGNOSIS — Z950 Presence of cardiac pacemaker: Secondary | ICD-10-CM

## 2024-03-09 DIAGNOSIS — I442 Atrioventricular block, complete: Secondary | ICD-10-CM | POA: Diagnosis not present

## 2024-03-09 DIAGNOSIS — I4819 Other persistent atrial fibrillation: Secondary | ICD-10-CM

## 2024-03-09 NOTE — Telephone Encounter (Signed)
 Spoke to [place] with [employer].Advised a rep is needed at patient's cardioversion scheduled Fri 6/20 at 11:00 am at [facility].

## 2024-03-09 NOTE — Patient Instructions (Signed)
 Medication Instructions:  Continue same medications *If you need a refill on your cardiac medications before your next appointment, please call your pharmacy*  Lab Work: Cbc,bmet,tsh today  Testing/Procedures: Cardioversion  Follow-Up: At [facility], you and your health needs are our priority.  As part of our continuing mission to provide you with exceptional heart care, our providers are all part of one team.  This team includes your primary Cardiologist (physician) and Advanced Practice Providers or APPs (Physician Assistants and Nurse Practitioners) who all work together to provide you with the care you need, when you need it.  Your next appointment:  After Cardioversion    Provider:  Dr.Jordan   We recommend signing up for the patient portal called MyChart.  Sign up information is provided on this After Visit Summary.  MyChart is used to connect with patients for Virtual Visits (Telemedicine).  Patients are able to view lab/test results, encounter notes, upcoming appointments, etc.  Non-urgent messages can be sent to your provider as well.   To learn more about what you can do with MyChart, go to [email or web address].      Dear Lisa [name]  You are scheduled for a Cardioversion on Friday, June 20 with Dr. Audery Blazing.  Please arrive at the [facility] (Main Entrance A) at [facility]: [address] [place], [place] [address] at 10:00 AM (This time is 1 hour(s) before your procedure to ensure your preparation).   Free valet parking service is available. You will check in at ADMITTING.   *Please Note: You will receive a call the day before your procedure to confirm the appointment time. That time may have changed from the original time based on the schedule for that day.*   DIET:  Nothing to eat or drink after midnight except a sip of water with medications (see medication instructions below)    Continue taking your anticoagulant (blood thinner):  Apixaban  (Eliquis ).  You will need to continue this after your procedure until you are told by your provider that it is safe to stop.    LABS:cbc,bmet,tsh today   FYI:  For your safety, and to allow us  to monitor your vital signs accurately during the surgery/procedure we request: If you have artificial nails, gel coating, SNS etc, please have those removed prior to your surgery/procedure. Not having the nail coverings /polish removed may result in cancellation or delay of your surgery/procedure.  Your support person will be asked to wait in the waiting room during your procedure.  It is OK to have someone drop you off and come back when you are ready to be discharged.  You cannot drive after the procedure and will need someone to drive you home.  Bring your insurance cards.  *Special Note: Every effort is made to have your procedure done on time. Occasionally there are emergencies that occur at the hospital that may cause delays. Please be patient if a delay does occur.

## 2024-03-10 ENCOUNTER — Ambulatory Visit: Payer: Self-pay | Admitting: Cardiology

## 2024-03-10 LAB — CBC WITH DIFFERENTIAL/PLATELET
Basophils Absolute: 0.1 10*3/uL (ref 0.0–0.2)
Basos: 1 %
EOS (ABSOLUTE): 0.3 10*3/uL (ref 0.0–0.4)
Eos: 4 %
Hematocrit: 39.2 % (ref 34.0–46.6)
Hemoglobin: 12.1 g/dL (ref 11.1–15.9)
Immature Grans (Abs): 0 10*3/uL (ref 0.0–0.1)
Immature Granulocytes: 0 %
Lymphocytes Absolute: 2.7 10*3/uL (ref 0.7–3.1)
Lymphs: 34 %
MCH: 25.4 pg — ABNORMAL LOW (ref 26.6–33.0)
MCHC: 30.9 g/dL — ABNORMAL LOW (ref 31.5–35.7)
MCV: 82 fL (ref 79–97)
Monocytes Absolute: 0.6 10*3/uL (ref 0.1–0.9)
Monocytes: 7 %
Neutrophils Absolute: 4.5 10*3/uL (ref 1.4–7.0)
Neutrophils: 54 %
Platelets: 297 10*3/uL (ref 150–450)
RBC: 4.76 x10E6/uL (ref 3.77–5.28)
RDW: 14.6 % (ref 11.7–15.4)
WBC: 8.2 10*3/uL (ref 3.4–10.8)

## 2024-03-10 LAB — BASIC METABOLIC PANEL WITH GFR
BUN/Creatinine Ratio: 16 (ref 12–28)
BUN: 20 mg/dL (ref 8–27)
CO2: 22 mmol/L (ref 20–29)
Calcium: 10.2 mg/dL (ref 8.7–10.3)
Chloride: 101 mmol/L (ref 96–106)
Creatinine, Ser: 1.25 mg/dL — ABNORMAL HIGH (ref 0.57–1.00)
Glucose: 95 mg/dL (ref 70–99)
Potassium: 4.3 mmol/L (ref 3.5–5.2)
Sodium: 138 mmol/L (ref 134–144)
eGFR: 43 mL/min/{1.73_m2} — ABNORMAL LOW (ref >59)

## 2024-03-10 LAB — TSH: TSH: 3.52 u[IU]/mL (ref 0.450–4.500)

## 2024-03-12 NOTE — Progress Notes (Signed)
 Spoke to patient and instructed them to come at 1015  and to be NPO after 0000. Medications reviewed.   Confirmed that patient will have a ride home and someone to stay with them for 24 hours after the procedure.

## 2024-03-13 ENCOUNTER — Ambulatory Visit (HOSPITAL_COMMUNITY): Admitting: Anesthesiology

## 2024-03-13 ENCOUNTER — Encounter (HOSPITAL_COMMUNITY)
Admission: RE | Disposition: A | Discharge status: Home / Self Care | Payer: Self-pay | Source: Home / Self Care | Attending: Cardiology

## 2024-03-13 ENCOUNTER — Encounter (HOSPITAL_COMMUNITY): Payer: Self-pay | Admitting: Cardiology

## 2024-03-13 ENCOUNTER — Other Ambulatory Visit: Payer: Self-pay

## 2024-03-13 ENCOUNTER — Ambulatory Visit (HOSPITAL_COMMUNITY)
Admission: RE | Admit: 2024-03-13 | Discharge: 2024-03-13 | Disposition: A | Discharge status: Home / Self Care | Attending: Cardiology | Admitting: Cardiology

## 2024-03-13 DIAGNOSIS — E782 Mixed hyperlipidemia: Secondary | ICD-10-CM | POA: Diagnosis not present

## 2024-03-13 DIAGNOSIS — K219 Gastro-esophageal reflux disease without esophagitis: Secondary | ICD-10-CM | POA: Diagnosis not present

## 2024-03-13 DIAGNOSIS — I4819 Other persistent atrial fibrillation: Secondary | ICD-10-CM

## 2024-03-13 DIAGNOSIS — Z79899 Other long term (current) drug therapy: Secondary | ICD-10-CM | POA: Insufficient documentation

## 2024-03-13 DIAGNOSIS — I129 Hypertensive chronic kidney disease with stage 1 through stage 4 chronic kidney disease, or unspecified chronic kidney disease: Secondary | ICD-10-CM | POA: Diagnosis not present

## 2024-03-13 DIAGNOSIS — I480 Paroxysmal atrial fibrillation: Secondary | ICD-10-CM | POA: Diagnosis not present

## 2024-03-13 DIAGNOSIS — I4891 Unspecified atrial fibrillation: Secondary | ICD-10-CM

## 2024-03-13 DIAGNOSIS — I442 Atrioventricular block, complete: Secondary | ICD-10-CM | POA: Insufficient documentation

## 2024-03-13 DIAGNOSIS — E785 Hyperlipidemia, unspecified: Secondary | ICD-10-CM | POA: Diagnosis not present

## 2024-03-13 DIAGNOSIS — Z7901 Long term (current) use of anticoagulants: Secondary | ICD-10-CM | POA: Insufficient documentation

## 2024-03-13 DIAGNOSIS — Z950 Presence of cardiac pacemaker: Secondary | ICD-10-CM | POA: Insufficient documentation

## 2024-03-13 DIAGNOSIS — I10 Essential (primary) hypertension: Secondary | ICD-10-CM | POA: Insufficient documentation

## 2024-03-13 DIAGNOSIS — N1831 Chronic kidney disease, stage 3a: Secondary | ICD-10-CM

## 2024-03-13 HISTORY — PX: CARDIOVERSION: EP1203

## 2024-03-13 SURGERY — CARDIOVERSION (CATH LAB)
Anesthesia: General

## 2024-03-13 MED ORDER — PROPOFOL 10 MG/ML IV BOLUS
INTRAVENOUS | Status: DC | PRN
  Administered 2024-03-13: 60 mg via INTRAVENOUS

## 2024-03-13 MED ORDER — SODIUM CHLORIDE 0.9% FLUSH
3.0000 mL | Freq: Two times a day (BID) | INTRAVENOUS | Status: DC
  Administered 2024-03-13: 5 mL via INTRAVENOUS

## 2024-03-13 MED ORDER — LIDOCAINE 2% (20 MG/ML) 5 ML SYRINGE
INTRAMUSCULAR | Status: DC | PRN
  Administered 2024-03-13: 60 mg via INTRAVENOUS

## 2024-03-13 MED ORDER — SODIUM CHLORIDE 0.9% FLUSH: 3.0000 mL | INTRAVENOUS | Status: DC | PRN

## 2024-03-13 SURGICAL SUPPLY — 1 items: PAD DEFIB RADIO PHYSIO CONN (PAD) ×2 IMPLANT

## 2024-03-13 NOTE — Transfer of Care (Signed)
 Immediate Anesthesia Transfer of Care Note  Patient: Lisa [name]  Procedure(s) Performed: CARDIOVERSION  Patient Location: Cath Lab  Anesthesia Type:General  Level of Consciousness: drowsy and responds to stimulation  Airway & Oxygen Therapy: Patient Spontanous Breathing and Patient connected to nasal cannula oxygen  Post-op Assessment: Report given to RN and Post -op Vital signs reviewed and stable  Post vital signs: Reviewed and stable  Last Vitals:  Vitals Value Taken Time  BP 118/56 03/13/2024 1012  Temp    Pulse 65 03/13/2024 1012  Resp 18 03/13/2024 1012  SpO2 95% 03/13/2024 1012    Last Pain:  Vitals:   03/13/24 0948  TempSrc:   PainSc: 0-No pain         Complications: No notable events documented.

## 2024-03-13 NOTE — Anesthesia Postprocedure Evaluation (Signed)
 Anesthesia Post Note  Patient: Lisa [name]  Procedure(s) Performed: CARDIOVERSION     Patient location during evaluation: PACU Anesthesia Type: General Level of consciousness: awake and alert Pain management: pain level controlled Vital Signs Assessment: post-procedure vital signs reviewed and stable Respiratory status: spontaneous breathing, nonlabored ventilation and respiratory function stable Cardiovascular status: blood pressure returned to baseline and stable Postop Assessment: no apparent nausea or vomiting Anesthetic complications: no   No notable events documented.  Last Vitals:  Vitals:   03/13/24 1040 03/13/24 1050  BP: (!) 140/75 (!) 144/67  Pulse: 66 66  Resp: 13 20  Temp:    SpO2: 98% 93%    Last Pain:  Vitals:   03/13/24 1030  TempSrc:   PainSc: 0-No pain                 Earvin Goldberg

## 2024-03-13 NOTE — Procedures (Signed)
 Electrical Cardioversion Procedure Note SUEKO DIMICHELE [id number] 1940-09-29  Procedure: Electrical Cardioversion Indications:  Atrial Fibrillation  Procedure Details Consent: Risks of procedure as well as the alternatives and risks of each were explained to the (patient/caregiver).  Consent for procedure obtained. Time Out: Verified patient identification, verified procedure, site/side was marked, verified correct patient position, special equipment/implants available, medications/allergies/relevent history reviewed, required imaging and test results available.  Performed  Patient placed on cardiac monitor, pulse oximetry, supplemental oxygen as necessary.  Sedation given: Pt sedated by anesthesia with lidocaine  60 mg and diprovan 60 mg IV. Pacer pads placed anterior and posterior chest.  Cardioverted 1 time(s).  Cardioverted at 300J.  Evaluation Findings: Post procedure EKG shows: NSR Complications: None Patient did tolerate procedure well.   Alexandria Angel 03/13/2024, 9:32 AM

## 2024-03-13 NOTE — Interval H&P Note (Signed)
 History and Physical Interval Note:  03/13/2024 9:33 AM  Lisa [name]  has presented today for surgery, with the diagnosis of AFIB.  The various methods of treatment have been discussed with the patient and family. After consideration of risks, benefits and other options for treatment, the patient has consented to  Procedure(s): CARDIOVERSION (N/A) as a surgical intervention.  The patient's history has been reviewed, patient examined, no change in status, stable for surgery.  I have reviewed the patient's chart and labs.  Questions were answered to the patient's satisfaction.     Alexandria Angel

## 2024-03-13 NOTE — H&P (Signed)
 Office Visit 03/09/2024 [facility] at [employer] [facility] [employer]. [facility]    [place], Lisa Madden, MD Cardiology Pre-procedure lab exam +4 more Dx Referred by Lisa Hammersmith, MD Reason for Visit   Additional Documentation  Vitals: BP 160/80 Important   (BP Location: Left Arm, Patient Position: Sitting, Cuff Size: Normal)   Pulse 60   Ht 5' 5 (1.651 [name])   Wt 70.1 kg   SpO2 98%   BMI 25.73 kg/[name]   BSA 1.79 [name]   Pain [place] 0-No pain      More Vitals  Flowsheets: NEWS,   MEWS Score,   Vital Signs,   Anthropometrics  Encounter Info: Billing Info,   History,   Allergies,   Detailed Report   All Notes   Progress Notes by [place], Peter M, MD at 03/09/2024 1:20 PM  Author: [place], Peter M, MD Author Type: Physician Filed: 03/09/2024  5:14 PM  Note Status: Signed Cosign: Cosign Not Required Encounter Date: 03/09/2024  Editor: [place], Peter M, MD (Physician)             Expand All Collapse All  Cardiology Office Note:     Date:  03/05/2024    ID:  Lisa [name], DOB 17-Sep-1941, MRN [id number]   PCP:  Lisa Hammersmith, MD [facility] Cardiologist: Peter [place], MD    Reason for visit: Follow-up Afib and HTN   History of Present Illness:     Lisa [name] is a 83 y.o. female with a hx of atrial fibrillation s/p DCCV in 07/2018 (sx SOB, swelling, indigestion), hypertension, hyperlipidemia, left bundle branch block.   Myoview  in January 2019 was normal.   Z   She was seen in ED at [facility] in Jan 2023. She had an episode of syncope at home. Following this she had acute N/V and diarrhea.  Ecg showed NSR with chronic LBBB. BP was quite high to 202/110. Labs including troponins were negative. She hasn't had any further dizziness or syncope but has persistent elevated BP.  She was on metoprolol , losartan  and HCTZ.   When seen in Feb she was in NSR. Later presented in March with recurrent Afib. She underwent successful DCCV. Subsequently at home  she had recurrent syncope and was found to be in complete heart block with HR in the 20s. She was admitted and underwent placement of dual chamber pacemaker by Dr Lisa [name]. Echo was unremarkable. She was DC on metoprolol  and Eliquis . Prior renal artery duplex was negative for RAS.  I saw her on Dec 3 and she was doing well. She was admitted overnight on 12/15 for palpitations. Pacer interrogation showed Afib with RVR. She converted. Had atypical chest pain. Minimal troponin leak metoprolol  was increased to 50 mg bid and losartan  and HCT held. I saw her on 12/26 and she was doing ok. BP was high. Started on low dose hydralazine .  Seen in EP office on 12/31 and was in NSR then.    She was seen back with symptoms of dizziness, HA and hypotension. We stopped hydralazine  and switched her to diltiazem .   On follow up today she states she is doing OK. Did note some swelling in her ankles 6 weeks ago. A little SOB going up stairs. Minor palpitations. Noted on pacer check to have been in Afib since the end of April. Rate controlled. She notes since addition of diltiazem  BP has been doing very well with home readings between 119-144 systolic and  67-77 diastolic.      Objective     Past Medical History:  Diagnosis Date   Bundle branch block, left     Dyslipidemia     GERD (gastroesophageal reflux disease)     Hypercholesteremia     Hypertension     LBBB (left bundle branch block)     Pacemaker     Paroxysmal atrial fibrillation (HCC)     UTI (urinary tract infection)                 Past Surgical History:  Procedure Laterality Date   bladder polyp removal       BREAST EXCISIONAL BIOPSY Right      60 yrs ago- Benign   BREAST LUMPECTOMY Left      No Visible Scar, said 30 + years ago in situ, no chemo, no radiation, or hormone replacement     CARDIOVERSION N/A 07/30/2018    Procedure: CARDIOVERSION;  Surgeon: Lisa Madura, MD;  Location: [facility] ENDOSCOPY;  Service: Cardiovascular;  Laterality: N/A;    CARDIOVERSION N/A 11/28/2021    Procedure: CARDIOVERSION;  Surgeon: Lisa Fall, MD;  Location: [facility] INVASIVE CV LAB;  Service: Cardiovascular;  Laterality: N/A;   CESAREAN SECTION       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    Procedure: PACEMAKER IMPLANT;  Surgeon: Lisa Fall, MD;  Location: [facility] INVASIVE CV LAB;  Service: Cardiovascular;  Laterality: N/A;          Current Medications: Active Medications  No outpatient medications have been marked as taking for the 03/09/24 encounter (Appointment) with [place], Peter M, MD.        Allergies:   Codeine, Fosamax [alendronate sodium], Lisinopril, Olmesartan, and Amlodipine    Social History         Socioeconomic History   Marital status: Married      Spouse name: Not on file   Number of children: 2   Years of education: Not on file   Highest education level: Associate degree: occupational, Scientist, product/process development, or vocational program  Occupational History   Not on file  Tobacco Use   Smoking status: Former      Current packs/day: 0.00      Types: Cigarettes      Start date: 1973      Quit date: 1993      Years since quitting: 32.4   Smokeless tobacco: Never  Vaping Use   Vaping status: Never Used  Substance and Sexual Activity   Alcohol use: Yes   Drug use: No   Sexual activity: Not on file  Other Topics Concern   Not on file  Social History Narrative   Not on file    Social Drivers of Health        Financial Resource Strain: Low Risk  (10/07/2023)    Overall Financial Resource Strain (CARDIA)     Difficulty of Paying Living Expenses: Not hard at all  Food Insecurity: No Food Insecurity (10/07/2023)    Hunger Vital Sign     Worried About Running Out of Food in the Last Year: Never true     Ran Out of Food in the Last Year: Never true  Transportation Needs: No Transportation Needs (10/07/2023)    PRAPARE - Therapist, art (Medical): No     Lack of Transportation (Non-Medical): No  Physical Activity:  Sufficiently Active (10/07/2023)    Exercise Vital Sign     Days of Exercise per Week:  5 days     Minutes of Exercise per Session: 60 min  Stress: Stress Concern Present (10/07/2023)    [employer] of Occupational Health - Occupational Stress Questionnaire     Feeling of Stress : To some extent  Social Connections: Unknown (10/07/2023)    Social Connection and Isolation Panel     Frequency of Communication with Friends and Family: More than three times a week     Frequency of Social Gatherings with Friends and Family: Three times a week     Attends Religious Services: Patient declined     Active Member of Clubs or Organizations: No     Attends Banker Meetings: Never     Marital Status: Married      Family History: The patient's family history includes Breast cancer in her mother; CVA in her brother and brother; Crohn's disease in her brother; Diabetes in her brother; Hip fracture in her mother; Other in her brother; Renal cancer in her brother; Ulcers in her father.   ROS:   Please see the history of present illness.      EKGs/Labs/Other Studies Reviewed:          Recent Labs: 09/07/2023: B Natriuretic Peptide 157.3 09/08/2023: ALT 14; BUN 22; Creatinine, Ser 1.23; Hemoglobin 12.8; Magnesium  1.7; Platelets 322; Potassium 4.7; Sodium 138; TSH 2.201    Recent Lipid Panel Labs (Brief)       Lab Results  Component Value Date/Time    CHOL 210 (H) 03/26/2023 09:04 AM    TRIG 123.0 03/26/2023 09:04 AM    HDL 52.40 03/26/2023 09:04 AM    LDLCALC 133 (H) 03/26/2023 09:04 AM        Dated 06/03/20: cholesterol 159, triglycerides 94, HDL 59, LDL 83. Dated 06/15/21: A1c 6.3, Hgb 12.5. chemistries and TSH normal. Dated 11/15/21:A1c 6.3%     Echo 11/30/21: IMPRESSIONS     1. Left ventricular ejection fraction, by estimation, is 60 to 65%. Left  ventricular ejection fraction by 3D volume is 66 %. The left ventricle has  normal function. The left ventricle has no  regional wall motion  abnormalities. Left ventricular diastolic   parameters are consistent with Grade III diastolic dysfunction  (restrictive). Elevated left ventricular end-diastolic pressure.   2. Right ventricular systolic function is normal. The right ventricular  size is mildly enlarged. There is normal pulmonary artery systolic  pressure. The estimated right ventricular systolic pressure is 23.4 mmHg.   3. Left atrial size was moderately dilated.   4. Right atrial size was mildly dilated.   5. The mitral valve is normal in structure. No evidence of mitral valve  regurgitation. No evidence of mitral stenosis. Severe mitral annular  calcification.   6. The aortic valve is tricuspid. Aortic valve regurgitation is not  visualized. Aortic valve sclerosis/calcification is present, without any  evidence of aortic stenosis.   7. Aortic dilatation noted. There is borderline dilatation of the  ascending aorta, measuring 37 mm.   8. The inferior vena cava is normal in size with greater than 50%  respiratory variability, suggesting right atrial pressure of 3 mmHg.    Physical Exam:     VS:  There were no vitals taken for this visit.   No data found. I repeat BP and it was 134/80      Wt Readings from Last 3 Encounters:  01/21/24 152 lb (68.9 kg)  11/19/23 158 lb (71.7 kg)  10/28/23 157 lb (71.2 kg)  GEN:  Well nourished, well developed in no acute distress HEENT: Normal NECK: No JVD; No carotid bruits CARDIAC: RRR, no murmurs, rubs, gallops RESPIRATORY:  Clear to auscultation without rales, wheezing or rhonchi  ABDOMEN: Soft, non-tender, non-distended MUSCULOSKELETAL: No edema; No deformity  SKIN: Warm and dry NEUROLOGIC:  Alert and oriented PSYCHIATRIC:  Normal affect    Assessment ASSESSMENT AND PLAN        HTN - Essential. See above.  -doing much better with addition of low dose diltiazem .  - continue Diltiazem  120 mg daily, hydrochlorothiazide  12.5 mg daily,  losartan  100 mg daily and metoprolol  50 mg bid.  - asked keep BP readings.   - Dash diet. - continue to monitor at home   2. Paroxysmal atrial fibrillation -Status post DCCV in November 2019 and again  in Feb 2023 -Echo showed mild LV dysfunction with dyssynergy due to LBBB. Moderate LAE. EF 50%   -Continue Eliquis  for stroke prevention and higher metoprolol  dose  for rate control.  She has not missed any doses.  - she is back in Afib with controlled rate. Mildly symptomatic.  - recommend DCCV. Will arrange. Procedure and risks discussed with her.  - if CV does not hold will refer to EP   3. LBBB chronic.    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    5. HLD. Intolerant to statins.  No known history of vascular disease. Will focus on lifestyle modification for now.                         Medication Adjustments/Labs and Tests Ordered: Current medicines are reviewed at length with the patient today.  Concerns regarding medicines are outlined above.  No orders of the defined types were placed in this encounter.   No orders of the defined types were placed in this encounter.     There are no Patient Instructions on file for this visit.    Signed, Peter [place], MD  03/05/2024 5:00 PM    [facility]      For DCCV; compliant with apixaban ; no changes. Alexandria Angel

## 2024-03-13 NOTE — Anesthesia Preprocedure Evaluation (Signed)
 Anesthesia Evaluation  Patient identified by MRN, date of birth, ID band Patient awake    Reviewed: Allergy & Precautions, H&P , NPO status , Patient's Chart, lab work & pertinent test results, reviewed documented beta blocker date and time   Airway Mallampati: II  TM Distance: >3 FB Neck ROM: Full    Dental no notable dental hx. (+) Partial Lower, Partial Upper, Dental Advisory Given   Pulmonary neg pulmonary ROS, former smoker   Pulmonary exam normal breath sounds clear to auscultation       Cardiovascular hypertension, Pt. on medications and Pt. on home beta blockers + dysrhythmias Atrial Fibrillation + pacemaker  Rhythm:Irregular Rate:Normal     Neuro/Psych negative neurological ROS  negative psych ROS   GI/Hepatic Neg liver ROS,GERD  Medicated and Controlled,,  Endo/Other  negative endocrine ROS    Renal/GU negative Renal ROS  negative genitourinary   Musculoskeletal   Abdominal   Peds  Hematology negative hematology ROS (+)   Anesthesia Other Findings   Reproductive/Obstetrics negative OB ROS                             Anesthesia Physical Anesthesia Plan  ASA: III  Anesthesia Plan: General   Post-op Pain Management:    Induction: Intravenous  PONV Risk Score and Plan: 3 and Treatment may vary due to age or medical condition  Airway Management Planned: Mask  Additional Equipment:   Intra-op Plan:   Post-operative Plan:   Informed Consent: I have reviewed the patients History and Physical, chart, labs and discussed the procedure including the risks, benefits and alternatives for the proposed anesthesia with the patient or authorized representative who has indicated his/her understanding and acceptance.     Dental advisory given  Plan Discussed with: CRNA  Anesthesia Plan Comments:         Anesthesia Quick Evaluation

## 2024-03-14 ENCOUNTER — Encounter (HOSPITAL_COMMUNITY): Payer: Self-pay | Admitting: Cardiology

## 2024-03-20 NOTE — Progress Notes (Unsigned)
 Cardiology Office Note:    Date:  03/20/2024   ID:  Lisa [name], DOB 1941/03/31, MRN [id number]  PCP:  Purcell Emil Schanz, MD [facility] Cardiologist: Karol Skarzynski [place], MD   Reason for visit: Follow-up Afib and HTN  History of Present Illness:    Lisa [name] is a 83 y.o. female with a hx of atrial fibrillation s/p DCCV in 07/2018 (sx SOB, swelling, indigestion), hypertension, hyperlipidemia, left bundle branch block.   Myoview  in January 2019 was normal.   Z  She was seen in ED at [facility] in Jan 2023. She had an episode of syncope at home. Following this she had acute N/V and diarrhea.  Ecg showed NSR with chronic LBBB. BP was quite high to 202/110. Labs including troponins were negative. She hasn't had any further dizziness or syncope but has persistent elevated BP.  She was on metoprolol , losartan  and HCTZ.  When seen in Feb she was in NSR. Later presented in March with recurrent Afib. She underwent successful DCCV. 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 She was admitted and underwent placement of dual chamber pacemaker by Dr Waddell. Echo was unremarkable. She was DC on metoprolol  and Eliquis . Prior renal artery duplex was negative for RAS.  I saw her on Dec 3 and she was doing well. She was admitted overnight on 12/15 for palpitations. Pacer interrogation showed Afib with RVR. She converted. Had atypical chest pain. Minimal troponin leak metoprolol  was increased to 50 mg bid and losartan  and HCT held. I saw her on 12/26 and she was doing ok. BP was high. Started on low dose hydralazine .  Seen in EP office on 12/31 and was in NSR then.   She was seen back with symptoms of dizziness, HA and hypotension. We stopped hydralazine  and switched her to diltiazem .  On follow up today she states she is doing OK. Did note some swelling in her ankles 6 weeks ago. A little SOB going up stairs. Minor palpitations. Noted  on pacer check to have been in Afib since the end of April. Rate controlled. She notes since addition of diltiazem  BP has been doing very well with home readings between 119-144 systolic and 67-77 diastolic.   She underwent successful DCCV on 03/13/24. She has noted improvement in her ankle edema. BP at home has been ok. Some fluctuation. Noted on July 2 that HR was 120 but otherwise has been normal.      Past Medical History:  Diagnosis Date   Bundle branch block, left    Dyslipidemia    GERD (gastroesophageal reflux disease)    Hypercholesteremia    Hypertension    LBBB (left bundle branch block)    Pacemaker    Paroxysmal atrial fibrillation (HCC)    UTI (urinary tract infection)     Past Surgical History:  Procedure Laterality Date   bladder polyp removal     BREAST EXCISIONAL BIOPSY Right    60 yrs ago- Benign   BREAST LUMPECTOMY Left    No Visible Scar, said 30 + years ago in situ, no chemo, no radiation, or hormone replacement     CARDIOVERSION N/A 07/30/2018   Procedure: CARDIOVERSION;  Surgeon: Jeffrie Oneil BROCKS, MD;  Location: [facility] ENDOSCOPY;  Service: Cardiovascular;  Laterality: N/A;   CARDIOVERSION N/A 11/28/2021   Procedure: CARDIOVERSION;  Surgeon: Waddell Danelle ORN, MD;  Location: [facility] INVASIVE CV LAB;  Service: Cardiovascular;  Laterality: N/A;   CARDIOVERSION N/A 03/13/2024   Procedure: CARDIOVERSION;  Surgeon: Pietro Redell RAMAN, MD;  Location: [facility] INVASIVE CV LAB;  Service: Cardiovascular;  Laterality: N/A;   CESAREAN SECTION     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   Procedure: PACEMAKER IMPLANT;  Surgeon: Waddell Danelle ORN, MD;  Location: [facility] INVASIVE CV LAB;  Service: Cardiovascular;  Laterality: N/A;    Current Medications: No outpatient medications have been marked as taking for the 03/26/24 encounter (Appointment) with [place], Kingstin Heims M, MD.     Allergies:   Codeine, Fosamax [alendronate sodium], Lisinopril, Olmesartan, and Amlodipine   Social History   Socioeconomic  History   Marital status: Married    Spouse name: Not on file   Number of children: 2   Years of education: Not on file   Highest education level: Associate degree: occupational, Scientist, product/process development, or vocational program  Occupational History   Not on file  Tobacco Use   Smoking status: Former    Current packs/day: 0.00    Types: Cigarettes    Start date: 1973    Quit date: 1993    Years since quitting: 32.5   Smokeless tobacco: Never  Vaping Use   Vaping status: Never Used  Substance and Sexual Activity   Alcohol use: Yes   Drug use: No   Sexual activity: Not on file  Other Topics Concern   Not on file  Social History Narrative   Not on file   Social Drivers of Health   Financial Resource Strain: Low Risk  (10/07/2023)   Overall Financial Resource Strain (CARDIA)    Difficulty of Paying Living Expenses: Not hard at all  Food Insecurity: No Food Insecurity (10/07/2023)   Hunger Vital Sign    Worried About Running Out of Food in the Last Year: Never true    Ran Out of Food in the Last Year: Never true  Transportation Needs: No Transportation Needs (10/07/2023)   PRAPARE - Administrator, Civil Service (Medical): No    Lack of Transportation (Non-Medical): No  Physical Activity: Sufficiently Active (10/07/2023)   Exercise Vital Sign    Days of Exercise per Week: 5 days    Minutes of Exercise per Session: 60 min  Stress: Stress Concern Present (10/07/2023)   [employer] of Occupational Health - Occupational Stress Questionnaire    Feeling of Stress : To some extent  Social Connections: Unknown (10/07/2023)   Social Connection and Isolation Panel    Frequency of Communication with Friends and Family: More than three times a week    Frequency of Social Gatherings with Friends and Family: Three times a week    Attends Religious Services: Patient declined    Active Member of Clubs or Organizations: No    Attends Banker Meetings: Never    Marital Status:  Married     Family History: The patient's family history includes Breast cancer in her mother; CVA in her brother and brother; Crohn's disease in her brother; Diabetes in her brother; Hip fracture in her mother; Other in her brother; Renal cancer in her brother; Ulcers in her father.  ROS:   Please see the history of present illness.     EKGs/Labs/Other Studies Reviewed:       EKG Interpretation Date/Time:  Thursday March 26 2024 13:37:30 EDT Ventricular Rate:  74 PR Interval:  298 QRS Duration:  136 QT Interval:  440 QTC Calculation: 488 R Axis:   41  Text Interpretation: AV dual-paced rhythm  with prolonged AV conduction When compared with ECG of 13-Mar-2024 10:31, Electronic ventricular pacemaker has replaced Electronic atrial pacemaker Confirmed by [place], Zackary Mckeone [phone]) on 03/26/2024 1:41:38 PM   Recent Labs: 09/07/2023: B Natriuretic Peptide 157.3 09/08/2023: ALT 14; Magnesium  1.7 03/09/2024: BUN 20; Creatinine, Ser 1.25; Hemoglobin 12.1; Platelets 297; Potassium 4.3; Sodium 138; TSH 3.520   Recent Lipid Panel Lab Results  Component Value Date/Time   CHOL 210 (H) 03/26/2023 09:04 AM   TRIG 123.0 03/26/2023 09:04 AM   HDL 52.40 03/26/2023 09:04 AM   LDLCALC 133 (H) 03/26/2023 09:04 AM    Dated 06/03/20: cholesterol 159, triglycerides 94, HDL 59, LDL 83. Dated 06/15/21: A1c 6.3, Hgb 12.5. chemistries and TSH normal. Dated 11/15/21:A1c 6.3%   Echo 11/30/21: IMPRESSIONS     1. Left ventricular ejection fraction, by estimation, is 60 to 65%. Left  ventricular ejection fraction by 3D volume is 66 %. The left ventricle has  normal function. The left ventricle has no regional wall motion  abnormalities. Left ventricular diastolic   parameters are consistent with Grade III diastolic dysfunction  (restrictive). Elevated left ventricular end-diastolic pressure.   2. Right ventricular systolic function is normal. The right ventricular  size is mildly enlarged. There is normal  pulmonary artery systolic  pressure. The estimated right ventricular systolic pressure is 23.4 mmHg.   3. Left atrial size was moderately dilated.   4. Right atrial size was mildly dilated.   5. The mitral valve is normal in structure. No evidence of mitral valve  regurgitation. No evidence of mitral stenosis. Severe mitral annular  calcification.   6. The aortic valve is tricuspid. Aortic valve regurgitation is not  visualized. Aortic valve sclerosis/calcification is present, without any  evidence of aortic stenosis.   7. Aortic dilatation noted. There is borderline dilatation of the  ascending aorta, measuring 37 mm.   8. The inferior vena cava is normal in size with greater than 50%  respiratory variability, suggesting right atrial pressure of 3 mmHg.   Physical Exam:    VS:  There were no vitals taken for this visit.   No data found. I repeat BP and it was 134/80  Wt Readings from Last 3 Encounters:  03/13/24 151 lb (68.5 kg)  03/09/24 154 lb 9.6 oz (70.1 kg)  01/21/24 152 lb (68.9 kg)     GEN:  Well nourished, well developed in no acute distress HEENT: Normal NECK: No JVD; No carotid bruits CARDIAC: RRR, no murmurs, rubs, gallops RESPIRATORY:  Clear to auscultation without rales, wheezing or rhonchi  ABDOMEN: Soft, non-tender, non-distended MUSCULOSKELETAL: No edema; No deformity  SKIN: Warm and dry NEUROLOGIC:  Alert and oriented PSYCHIATRIC:  Normal affect     ASSESSMENT AND PLAN     HTN - Essential. See above.  -doing much better with addition of low dose diltiazem .  - continue Diltiazem  120 mg daily, hydrochlorothiazide  12.5 mg daily, losartan  100 mg daily and metoprolol  50 mg bid.  - monitor BP readings.   - Dash diet. - continue to monitor at home  2. Paroxysmal atrial fibrillation -Status post DCCV in November 2019 and again  in Feb 2023 -Echo showed mild LV dysfunction with dyssynergy due to LBBB. Moderate LAE. EF 50%   -Continue Eliquis  for stroke  prevention and higher metoprolol  dose  for rate control.   - had recurrent Afib.  - now s/p  DCCV. In atrially paced rhythm today - if CV does not hold will refer to EP  3. LBBB  chronic.   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   5. HLD. Intolerant to statins.  No known history of vascular disease. Will focus on lifestyle modification for now.               Medication Adjustments/Labs and Tests Ordered: Current medicines are reviewed at length with the patient today.  Concerns regarding medicines are outlined above.  No orders of the defined types were placed in this encounter.  No orders of the defined types were placed in this encounter.   There are no Patient Instructions on file for this visit.   Signed, Anjali Manzella [place], MD  03/20/2024 8:34 AM    [facility]

## 2024-03-24 ENCOUNTER — Telehealth: Payer: Self-pay | Admitting: Internal Medicine

## 2024-03-24 NOTE — Telephone Encounter (Signed)
 [employer] Alert received:  RA sensing amplitude (daily mean) below limit (< 0.5 mV) Last value 0.2 mV measured on Mar 24, 2024, 1:05:00 AM       The patient is s/p DCCV on 03/13/24. She is scheduled to see Dr. [place] in follow up on 03/26/24 at 2:20 pm. We will have industry assist with a PPM check while the patient is in the office.  Email sent to industry.  MyChart message sent to the patient to notify her that we will check her device that day.

## 2024-03-26 ENCOUNTER — Ambulatory Visit: Attending: Cardiology | Admitting: Cardiology

## 2024-03-26 ENCOUNTER — Ambulatory Visit: Attending: Cardiology

## 2024-03-26 VITALS — BP 149/80 | HR 74 | Ht 65.0 in | Wt 150.0 lb

## 2024-03-26 DIAGNOSIS — I4819 Other persistent atrial fibrillation: Secondary | ICD-10-CM

## 2024-03-26 DIAGNOSIS — Z950 Presence of cardiac pacemaker: Secondary | ICD-10-CM

## 2024-03-26 DIAGNOSIS — I10 Essential (primary) hypertension: Secondary | ICD-10-CM | POA: Diagnosis not present

## 2024-03-26 DIAGNOSIS — I442 Atrioventricular block, complete: Secondary | ICD-10-CM

## 2024-03-26 NOTE — Patient Instructions (Signed)
 Follow up as scheduled.

## 2024-03-26 NOTE — Progress Notes (Signed)
 Pt seen in device clinic to follow up on low RA sensing amplitude alert.  Pt evaluated and P waves measured 3+ mV.  Normal device function.  Will scan to Pt's chart unable to save to USB.

## 2024-03-26 NOTE — Patient Instructions (Signed)

## 2024-04-01 ENCOUNTER — Encounter: Payer: Self-pay | Admitting: Internal Medicine

## 2024-04-03 DIAGNOSIS — Z961 Presence of intraocular lens: Secondary | ICD-10-CM | POA: Diagnosis not present

## 2024-04-03 DIAGNOSIS — H26493 Other secondary cataract, bilateral: Secondary | ICD-10-CM | POA: Diagnosis not present

## 2024-04-03 DIAGNOSIS — H18513 Endothelial corneal dystrophy, bilateral: Secondary | ICD-10-CM | POA: Diagnosis not present

## 2024-04-03 DIAGNOSIS — H11441 Conjunctival cysts, right eye: Secondary | ICD-10-CM | POA: Diagnosis not present

## 2024-04-06 ENCOUNTER — Ambulatory Visit (INDEPENDENT_AMBULATORY_CARE_PROVIDER_SITE_OTHER): Payer: Medicare Other | Admitting: Emergency Medicine

## 2024-04-06 ENCOUNTER — Encounter: Payer: Self-pay | Admitting: Emergency Medicine

## 2024-04-06 VITALS — BP 120/70 | HR 71 | Temp 98.7°F | Ht 65.0 in | Wt 146.0 lb

## 2024-04-06 DIAGNOSIS — N1831 Chronic kidney disease, stage 3a: Secondary | ICD-10-CM

## 2024-04-06 DIAGNOSIS — D6859 Other primary thrombophilia: Secondary | ICD-10-CM

## 2024-04-06 DIAGNOSIS — I480 Paroxysmal atrial fibrillation: Secondary | ICD-10-CM

## 2024-04-06 DIAGNOSIS — Z7901 Long term (current) use of anticoagulants: Secondary | ICD-10-CM

## 2024-04-06 DIAGNOSIS — I10 Essential (primary) hypertension: Secondary | ICD-10-CM

## 2024-04-06 DIAGNOSIS — E785 Hyperlipidemia, unspecified: Secondary | ICD-10-CM

## 2024-04-06 DIAGNOSIS — F418 Other specified anxiety disorders: Secondary | ICD-10-CM | POA: Insufficient documentation

## 2024-04-06 DIAGNOSIS — R110 Nausea: Secondary | ICD-10-CM

## 2024-04-06 MED ORDER — ONDANSETRON HCL 4 MG PO TABS: 4.0000 mg | ORAL_TABLET | Freq: Three times a day (TID) | ORAL | 1 refills | Status: DC | PRN

## 2024-04-06 MED ORDER — ALPRAZOLAM 0.5 MG PO TABS: 0.5000 mg | ORAL_TABLET | Freq: Two times a day (BID) | ORAL | 2 refills | Status: DC | PRN

## 2024-04-06 NOTE — Progress Notes (Signed)
 Lisa [name] 83 y.o.   Chief Complaint  Patient presents with   Follow-up    Patient here for 6 month f/u for HTN. Patient had abd scheduled but was not able to get it done, she states she was in Afib and was very claustro. No other concerns     HISTORY OF PRESENT ILLNESS: This is a 83 y.o. female here for follow-up of chronic medical conditions History hypertension.  Normal blood pressure readings at home Recently had cardioversion due to atrial fibrillation Has a pacemaker working well. On long-term anticoagulation with Eliquis  Recent blood work within normal limits.  Has history of chronic kidney disease stage III a Occasional nausea.  Requesting prescription for Zofran  Situational anxiety.  Stressed that about chronic medical conditions No other complaints or medical concerns today.  HPI   Prior to Admission medications   Medication Sig Start Date End Date Taking? Authorizing Provider  ALPRAZolam  (XANAX ) 0.5 MG tablet Take 1 tablet (0.5 mg total) by mouth 2 (two) times daily as needed for anxiety. 04/06/24  Yes Noraa Pickeral, Emil Schanz, MD  Cholecalciferol (VITAMIN D3) 50 MCG (2000 UT) TABS Take 2,000 Units by mouth daily.   Yes [provider]  diltiazem  (CARDIZEM  CD) 120 MG 24 hr capsule Take 1 capsule (120 mg total) by mouth daily. 11/19/23 04/06/24 Yes Sw[place]il[place], Peter M, MD  ELIQUIS  5 MG TABS tablet TAKE 1 TABLET BY MOUTH TWICE DAILY 12/23/23  Yes [place], Peter M, MD  hydrochlorothiazide  (MICROZIDE ) 12.5 MG capsule Take 1 capsule (12.5 mg total) by mouth daily. 12/12/23 04/06/24 Yes [place], Peter M, MD  losartan  (COZAAR ) 100 MG tablet TAKE 1 TABLET BY MOUTH DAILY GENERIC EQUIVALENT FOR COZAAR  12/23/23  Yes [place], Peter M, MD  metoprolol  tartrate (LOPRESSOR ) 50 MG tablet Take 1 tablet (50 mg total) by mouth 2 (two) times daily. 11/19/23 04/06/24 Yes [place], Peter M, MD  pantoprazole  (PROTONIX ) 40 MG tablet TAKE 1 TABLET BY MOUTH ONCE DAILY IN THE MORNING -  IF  SYMPTOMS  RETURN   GO  BACK  TO  TWICE  DAILY 03/09/24  Yes Armbruster, Elspeth SQUIBB, MD  ondansetron  (ZOFRAN ) 4 MG tablet Take 1 tablet (4 mg total) by mouth every 8 (eight) hours as needed for nausea or vomiting. 04/06/24   Purcell Emil Schanz, MD    Allergies  Allergen Reactions   Codeine Nausea And Vomiting and Other (See Comments)    GI upset    Fosamax [Alendronate Sodium] Other (See Comments)    Tooth problems   Lisinopril Cough   Olmesartan Other (See Comments)    Possible photodermatitis    Amlodipine Swelling    edema     Patient Active Problem List   Diagnosis Date Noted   Chronic diastolic CHF (congestive heart failure) (HCC) 09/08/2023   Statin intolerance 03/26/2023   Thrombophilia (HCC) 09/25/2022   Current use of long term anticoagulation 09/25/2022   Dyslipidemia 03/14/2022   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   Age-related osteoporosis without current pathological fracture 11/27/2021   Chronic kidney disease, stage 3a (HCC) 11/27/2021   Essential hypertension 11/27/2021   Impaired fasting glucose 11/27/2021   Mixed hyperlipidemia 11/27/2021   Personal history of malignant neoplasm of breast 11/27/2021   Personal history of urinary calculi 11/27/2021   Vitamin D deficiency 11/27/2021   Changing skin lesion 05/12/2021   Actinic keratosis 02/24/2019   Paroxysmal atrial fibrillation (HCC)    GERD (gastroesophageal reflux disease)    Left bundle branch block  Past Medical History:  Diagnosis Date   Bundle branch block, left    Dyslipidemia    GERD (gastroesophageal reflux disease)    Hypercholesteremia    Hypertension    LBBB (left bundle branch block)    Pacemaker    Paroxysmal atrial fibrillation (HCC)    UTI (urinary tract infection)     Past Surgical History:  Procedure Laterality Date   bladder polyp removal     BREAST EXCISIONAL BIOPSY Right    60 yrs ago- Benign   BREAST LUMPECTOMY Left    No Visible Scar, said 30 + years ago in situ, no chemo, no  radiation, or hormone replacement     CARDIOVERSION N/A 07/30/2018   Procedure: CARDIOVERSION;  Surgeon: Jeffrie Oneil BROCKS, MD;  Location: [facility] ENDOSCOPY;  Service: Cardiovascular;  Laterality: N/A;   CARDIOVERSION N/A 11/28/2021   Procedure: CARDIOVERSION;  Surgeon: Waddell Danelle ORN, MD;  Location: [facility] INVASIVE CV LAB;  Service: Cardiovascular;  Laterality: N/A;   CARDIOVERSION N/A 03/13/2024   Procedure: CARDIOVERSION;  Surgeon: Pietro Redell RAMAN, MD;  Location: [facility] INVASIVE CV LAB;  Service: Cardiovascular;  Laterality: N/A;   CESAREAN SECTION     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   Procedure: PACEMAKER IMPLANT;  Surgeon: Waddell Danelle ORN, MD;  Location: [facility] INVASIVE CV LAB;  Service: Cardiovascular;  Laterality: N/A;    Social History   Socioeconomic History   Marital status: Married    Spouse name: Not on file   Number of children: 2   Years of education: Not on file   Highest education level: Associate degree: academic program  Occupational History   Not on file  Tobacco Use   Smoking status: Former    Current packs/day: 0.00    Types: Cigarettes    Start date: 50    Quit date: 1993    Years since quitting: 32.5   Smokeless tobacco: Never  Vaping Use   Vaping status: Never Used  Substance and Sexual Activity   Alcohol use: Yes   Drug use: No   Sexual activity: Not on file  Other Topics Concern   Not on file  Social History Narrative   Not on file   Social Drivers of Health   Financial Resource Strain: Patient Declined (04/03/2024)   Overall Financial Resource Strain (CARDIA)    Difficulty of Paying Living Expenses: Patient declined  Food Insecurity: No Food Insecurity (04/03/2024)   Hunger Vital Sign    Worried About Running Out of Food in the Last Year: Never true    Ran Out of Food in the Last Year: Never true  Transportation Needs: Unknown (04/03/2024)   PRAPARE - Administrator, Civil Service (Medical): Not on file    Lack of Transportation (Non-Medical): No   Physical Activity: Unknown (04/03/2024)   Exercise Vital Sign    Days of Exercise per Week: 4 days    Minutes of Exercise per Session: Not on file  Stress: Stress Concern Present (04/03/2024)   [employer] of Occupational Health - Occupational Stress Questionnaire    Feeling of Stress: Very much  Social Connections: Moderately Isolated (04/03/2024)   Social Connection and Isolation Panel    Frequency of Communication with Friends and Family: Twice a week    Frequency of Social Gatherings with Friends and Family: Once a week    Attends Religious Services: Never    Database administrator or Organizations: No    Attends Banker Meetings: Not on file  Marital Status: Married  Catering manager Violence: Not At Risk (09/08/2023)   Humiliation, Afraid, Rape, and Kick questionnaire    Fear of Current or Ex-Partner: No    Emotionally Abused: No    Physically Abused: No    Sexually Abused: No    Family History  Problem Relation Age of Onset   Breast cancer Mother    Hip fracture Mother    Ulcers Father    CVA Brother    Renal cancer Brother    Other Brother        hip replacement   CVA Brother        Had PaceMaker   Crohn's disease Brother    Diabetes Brother      Review of Systems  Constitutional: Negative.  Negative for chills and fever.  HENT: Negative.  Negative for congestion and sore throat.   Respiratory: Negative.  Negative for cough and shortness of breath.   Cardiovascular: Negative.  Negative for chest pain and palpitations.  Gastrointestinal:  Positive for nausea. Negative for abdominal pain, diarrhea and vomiting.  Genitourinary: Negative.  Negative for dysuria and hematuria.  Skin: Negative.  Negative for rash.  Neurological: Negative.  Negative for dizziness and headaches.  Psychiatric/Behavioral:  The patient is nervous/anxious.   All other systems reviewed and are negative.   Vitals:   04/06/24 0849  BP: (!) 154/80  Pulse: 71  Temp:  98.7 F (37.1 C)  SpO2: 97%    Physical Exam Vitals reviewed.  Constitutional:      Appearance: Normal appearance.  HENT:     Head: Normocephalic.  Eyes:     Extraocular Movements: Extraocular movements intact.  Cardiovascular:     Rate and Rhythm: Normal rate and regular rhythm.     Pulses: Normal pulses.     Heart sounds: Normal heart sounds.  Pulmonary:     Effort: Pulmonary effort is normal.     Breath sounds: Normal breath sounds.  Skin:    General: Skin is warm and dry.     Capillary Refill: Capillary refill takes less than 2 seconds.  Neurological:     General: No focal deficit present.     Mental Status: She is alert and oriented to person, place, and time.  Psychiatric:        Mood and Affect: Mood normal.        Behavior: Behavior normal.      ASSESSMENT & PLAN: A total of 42 minutes was spent with the patient and counseling/coordination of care regarding preparing for this visit, review of most recent office visit notes, most recent cardiologist office visit notes, review of multiple chronic medical conditions and their management, review of all medications, review of most recent bloodwork results, review of health maintenance items, education on nutrition, prognosis, documentation, and need for follow up.   Problem List Items Addressed This Visit       Cardiovascular and Mediastinum   Paroxysmal atrial fibrillation (HCC)   Recent cardioversion.   Well-controlled heart rate.  Sinus rhythm today. On long-term anticoagulation with Eliquis  5 mg twice a day Continues beta-blocker with metoprolol  tartrate 50 mg twice a day No clinical bleeding episodes Fall precautions given        Essential hypertension - Primary   BP Readings from Last 3 Encounters:  04/06/24 (!) 154/80  03/26/24 (!) 149/80  03/13/24 (!) 144/67  Normal blood pressure readings at home as recorded by her Continue losartan  100 mg daily, metoprolol  tartrate 50 mg twice a  day and diltiazem   120 mg daily Due to chronic kidney disease had hydrochlorothiazide  recently stopped        Genitourinary   Chronic kidney disease, stage 3a (HCC)   Stable.  Advised to stay well-hydrated and avoid NSAIDs On losartan  100 mg daily for hypertension which will also help kidneys        Hematopoietic and Hemostatic   Thrombophilia (HCC)   Continues Eliquis  5 mg twice a day        Other   Dyslipidemia   Intolerant to statins. No known history of vascular disease. Will focus on lifestyle modification for now.       Current use of long term anticoagulation   No clinical bleeding episodes Fall precautions given Tylenol  for pain.  Avoid NSAIDs      Nausea without vomiting   Active and affecting quality of life Multifactorial Could be related to multiple medications However has history of GERD, on Protonix  twice a day Stress contributing to nausea      Relevant Medications   ondansetron  (ZOFRAN ) 4 MG tablet   Situational anxiety   Very stressed out over chronic medical conditions Some of the nausea secondary to anxiety Stress management and mental health discussed Recommend Xanax  0.5 mg as needed no more than twice a day New prescription sent to pharmacy of record today.      Relevant Medications   ALPRAZolam  (XANAX ) 0.5 MG tablet   Patient Instructions  Health Maintenance After Age 2 After age 56, you are at a higher risk for certain long-term diseases and infections as well as injuries from falls. Falls are a major cause of broken bones and head injuries in people who are older than age 17. Getting regular preventive care can help to keep you healthy and well. Preventive care includes getting regular testing and making lifestyle changes as recommended by your health care provider. Talk with your health care provider about: Which screenings and tests you should have. A screening is a test that checks for a disease when you have no symptoms. A diet and exercise plan that is  right for you. What should I know about screenings and tests to prevent falls? Screening and testing are the best ways to find a health problem early. Early diagnosis and treatment give you the best chance of managing medical conditions that are common after age 52. Certain conditions and lifestyle choices may make you more likely to have a fall. Your health care provider may recommend: Regular vision checks. Poor vision and conditions such as cataracts can make you more likely to have a fall. If you wear glasses, make sure to get your prescription updated if your vision changes. Medicine review. Work with your health care provider to regularly review all of the medicines you are taking, including over-the-counter medicines. Ask your health care provider about any side effects that may make you more likely to have a fall. Tell your health care provider if any medicines that you take make you feel dizzy or sleepy. Strength and balance checks. Your health care provider may recommend certain tests to check your strength and balance while standing, walking, or changing positions. Foot health exam. Foot pain and numbness, as well as not wearing proper footwear, can make you more likely to have a fall. Screenings, including: Osteoporosis screening. Osteoporosis is a condition that causes the bones to get weaker and break more easily. Blood pressure screening. Blood pressure changes and medicines to control blood pressure can make you feel  dizzy. Depression screening. You may be more likely to have a fall if you have a fear of falling, feel depressed, or feel unable to do activities that you used to do. Alcohol use screening. Using too much alcohol can affect your balance and may make you more likely to have a fall. Follow these instructions at home: Lifestyle Do not drink alcohol if: Your health care provider tells you not to drink. If you drink alcohol: Limit how much you have to: 0-1 drink a day for  women. 0-2 drinks a day for men. Know how much alcohol is in your drink. In the [place] one drink equals one 12 oz bottle of beer (355 mL), one 5 oz glass of wine (148 mL), or one 1 oz glass of hard liquor (44 mL). Do not use any products that contain nicotine or tobacco. These products include cigarettes, chewing tobacco, and vaping devices, such as e-cigarettes. If you need help quitting, ask your health care provider. Activity  Follow a regular exercise program to stay fit. This will help you maintain your balance. Ask your health care provider what types of exercise are appropriate for you. If you need a cane or walker, use it as recommended by your health care provider. Wear supportive shoes that have nonskid soles. Safety  Remove any tripping hazards, such as rugs, cords, and clutter. Install safety equipment such as grab bars in bathrooms and safety rails on stairs. Keep rooms and walkways well-lit. General instructions Talk with your health care provider about your risks for falling. Tell your health care provider if: You fall. Be sure to tell your health care provider about all falls, even ones that seem minor. You feel dizzy, tiredness (fatigue), or off-balance. Take over-the-counter and prescription medicines only as told by your health care provider. These include supplements. Eat a healthy diet and maintain a healthy weight. A healthy diet includes low-fat dairy products, low-fat (lean) meats, and fiber from whole grains, beans, and lots of fruits and vegetables. Stay current with your vaccines. Schedule regular health, dental, and eye exams. Summary Having a healthy lifestyle and getting preventive care can help to protect your health and wellness after age 73. Screening and testing are the best way to find a health problem early and help you avoid having a fall. Early diagnosis and treatment give you the best chance for managing medical conditions that are more common for people  who are older than age 1. Falls are a major cause of broken bones and head injuries in people who are older than age 77. Take precautions to prevent a fall at home. Work with your health care provider to learn what changes you can make to improve your health and wellness and to prevent falls. This information is not intended to replace advice given to you by your health care provider. Make sure you discuss any questions you have with your health care provider. Document Revised: 01/30/2021 Document Reviewed: 01/30/2021 Elsevier Patient Education  2024 Elsevier Inc.     Emil Schaumann, MD [facility]

## 2024-04-06 NOTE — Patient Instructions (Signed)
 Health Maintenance After Age 82 After age 4, you are at a higher risk for certain long-term diseases and infections as well as injuries from falls. Falls are a major cause of broken bones and head injuries in people who are older than age 47. Getting regular preventive care can help to keep you healthy and well. Preventive care includes getting regular testing and making lifestyle changes as recommended by your health care provider. Talk with your health care provider about: Which screenings and tests you should have. A screening is a test that checks for a disease when you have no symptoms. A diet and exercise plan that is right for you. What should I know about screenings and tests to prevent falls? Screening and testing are the best ways to find a health problem early. Early diagnosis and treatment give you the best chance of managing medical conditions that are common after age 37. Certain conditions and lifestyle choices may make you more likely to have a fall. Your health care provider may recommend: Regular vision checks. Poor vision and conditions such as cataracts can make you more likely to have a fall. If you wear glasses, make sure to get your prescription updated if your vision changes. Medicine review. Work with your health care provider to regularly review all of the medicines you are taking, including over-the-counter medicines. Ask your health care provider about any side effects that may make you more likely to have a fall. Tell your health care provider if any medicines that you take make you feel dizzy or sleepy. Strength and balance checks. Your health care provider may recommend certain tests to check your strength and balance while standing, walking, or changing positions. Foot health exam. Foot pain and numbness, as well as not wearing proper footwear, can make you more likely to have a fall. Screenings, including: Osteoporosis screening. Osteoporosis is a condition that causes  the bones to get weaker and break more easily. Blood pressure screening. Blood pressure changes and medicines to control blood pressure can make you feel dizzy. Depression screening. You may be more likely to have a fall if you have a fear of falling, feel depressed, or feel unable to do activities that you used to do. Alcohol use screening. Using too much alcohol can affect your balance and may make you more likely to have a fall. Follow these instructions at home: Lifestyle Do not drink alcohol if: Your health care provider tells you not to drink. If you drink alcohol: Limit how much you have to: 0-1 drink a day for women. 0-2 drinks a day for men. Know how much alcohol is in your drink. In the [place] one drink equals one 12 oz bottle of beer (355 mL), one 5 oz glass of wine (148 mL), or one 1 oz glass of hard liquor (44 mL). Do not use any products that contain nicotine or tobacco. These products include cigarettes, chewing tobacco, and vaping devices, such as e-cigarettes. If you need help quitting, ask your health care provider. Activity  Follow a regular exercise program to stay fit. This will help you maintain your balance. Ask your health care provider what types of exercise are appropriate for you. If you need a cane or walker, use it as recommended by your health care provider. Wear supportive shoes that have nonskid soles. Safety  Remove any tripping hazards, such as rugs, cords, and clutter. Install safety equipment such as grab bars in bathrooms and safety rails on stairs. Keep rooms and walkways  well-lit. General instructions Talk with your health care provider about your risks for falling. Tell your health care provider if: You fall. Be sure to tell your health care provider about all falls, even ones that seem minor. You feel dizzy, tiredness (fatigue), or off-balance. Take over-the-counter and prescription medicines only as told by your health care provider. These include  supplements. Eat a healthy diet and maintain a healthy weight. A healthy diet includes low-fat dairy products, low-fat (lean) meats, and fiber from whole grains, beans, and lots of fruits and vegetables. Stay current with your vaccines. Schedule regular health, dental, and eye exams. Summary Having a healthy lifestyle and getting preventive care can help to protect your health and wellness after age 11. Screening and testing are the best way to find a health problem early and help you avoid having a fall. Early diagnosis and treatment give you the best chance for managing medical conditions that are more common for people who are older than age 28. Falls are a major cause of broken bones and head injuries in people who are older than age 48. Take precautions to prevent a fall at home. Work with your health care provider to learn what changes you can make to improve your health and wellness and to prevent falls. This information is not intended to replace advice given to you by your health care provider. Make sure you discuss any questions you have with your health care provider. Document Revised: 01/30/2021 Document Reviewed: 01/30/2021 Elsevier Patient Education  2024 [employer].

## 2024-04-06 NOTE — Assessment & Plan Note (Signed)
No clinical bleeding episodes Fall precautions given Tylenol for pain.  Avoid NSAIDs

## 2024-04-06 NOTE — Assessment & Plan Note (Addendum)
 Recent cardioversion.   Well-controlled heart rate.  Sinus rhythm today. On long-term anticoagulation with Eliquis  5 mg twice a day Continues beta-blocker with metoprolol  tartrate 50 mg twice a day No clinical bleeding episodes Fall precautions given

## 2024-04-06 NOTE — Assessment & Plan Note (Signed)
 Intolerant to statins.  No known history of vascular disease. Will focus on lifestyle modification for now.

## 2024-04-06 NOTE — Assessment & Plan Note (Signed)
 Very stressed out over chronic medical conditions Some of the nausea secondary to anxiety Stress management and mental health discussed Recommend Xanax  0.5 mg as needed no more than twice a day New prescription sent to pharmacy of record today.

## 2024-04-06 NOTE — Assessment & Plan Note (Signed)
 Stable.  Advised to stay well-hydrated and avoid NSAIDs On losartan  100 mg daily for hypertension which will also help kidneys

## 2024-04-06 NOTE — Assessment & Plan Note (Signed)
Continues Eliquis 5 mg twice a day

## 2024-04-06 NOTE — Assessment & Plan Note (Signed)
 Active and affecting quality of life Multifactorial Could be related to multiple medications However has history of GERD, on Protonix  twice a day Stress contributing to nausea

## 2024-04-06 NOTE — Assessment & Plan Note (Signed)
 BP Readings from Last 3 Encounters:  04/06/24 (!) 154/80  03/26/24 (!) 149/80  03/13/24 (!) 144/67  Normal blood pressure readings at home as recorded by her Continue losartan  100 mg daily, metoprolol  tartrate 50 mg twice a day and diltiazem  120 mg daily Due to chronic kidney disease had hydrochlorothiazide  recently stopped

## 2024-04-07 ENCOUNTER — Telehealth: Payer: Self-pay | Admitting: *Deleted

## 2024-04-07 NOTE — Telephone Encounter (Signed)
 Alert received from [employer]:    ______________________________________________________________________  Successful DCCV 03/13/24  Appears to have converted back into AF with controlled V-rates  OAC, BB, and CCB in active medication list  Routing to triage to call patient this morning to assess for symptoms

## 2024-04-07 NOTE — Telephone Encounter (Signed)
 Spoke with patient after converting back to AF on July 14th. Pt is asymptomatic at this time. Informed patient we will forward to Dr. Swaziland and Dr. Waddell for review and next steps. Advised to call device clinic back with any new symptoms. Patient appreciative of call.

## 2024-04-14 NOTE — Telephone Encounter (Signed)
 Pt called in stating that she thinks that she is still in afib according to her watch. She has not heard back since she spoke with us  last and would like a nurse to call her back

## 2024-04-14 NOTE — Telephone Encounter (Signed)
 Reviewed [employer] website to follow up on the patient's AF burden. Confirmed that as of today, the patient is still out of rhythm with controlled ventricular rates.   Outreach made to the patient: Per Ms. Dozier, she noted that her watch was indicating continued a-fib as of yesterday. She does report symptoms of fatigue and giving out of breath, but that these symptoms are stable.  I advised the patient that per her device, she is still out of rhythm.   Confirmed medication compliance with: - Eliquis  5 mg BID - Metoprolol  tartrate 50 mg BID - Diltiazem  120 mg every day  The patient is aware that this message was previously forwarded to Dr. Waddell to review. I advised her that we will follow up with Dr. Waddell in clinic tomorrow for further recommendations and give her a call back.   The patient voices understanding and is agreeable.

## 2024-04-15 NOTE — Telephone Encounter (Signed)
 Spoke with Dr. Waddell about patient, Dr. Waddell advises referral to AF clinic to discuss further. Patient advised of Dr. Adrian recommendations and agreeable to see AF clinic.   Patient advised to call with any changes in the mean time. Pt voiced understanding.

## 2024-04-17 ENCOUNTER — Encounter (HOSPITAL_COMMUNITY): Payer: Self-pay | Admitting: Physician Assistant

## 2024-04-17 ENCOUNTER — Ambulatory Visit (HOSPITAL_COMMUNITY)
Admission: RE | Admit: 2024-04-17 | Discharge: 2024-04-17 | Disposition: A | Discharge status: Home / Self Care | Source: Ambulatory Visit | Attending: Physician Assistant | Admitting: Physician Assistant

## 2024-04-17 VITALS — BP 118/80 | HR 60 | Ht 65.0 in | Wt 152.0 lb

## 2024-04-17 DIAGNOSIS — I480 Paroxysmal atrial fibrillation: Secondary | ICD-10-CM | POA: Diagnosis not present

## 2024-04-17 DIAGNOSIS — I4819 Other persistent atrial fibrillation: Secondary | ICD-10-CM | POA: Diagnosis not present

## 2024-04-17 DIAGNOSIS — D6869 Other thrombophilia: Secondary | ICD-10-CM | POA: Diagnosis not present

## 2024-04-17 MED ORDER — AMIODARONE HCL 200 MG PO TABS: ORAL_TABLET | ORAL | 0 refills | Status: DC

## 2024-04-17 NOTE — Progress Notes (Signed)
 Primary Care Physician: Purcell Emil Schanz, MD Primary Cardiologist: Peter Swaziland, MD Electrophysiologist: Danelle Birmingham, MD  Referring Physician: Dr Birmingham [name] is a 83 y.o. female with a history of HTN, HLD, CHB s/p PPM, atrial fibrillation who presents for follow up in the [facility].  The patient recently had a DCCV on 03/13/24 but had quick return of afib. She has symptoms of fatigue and SOB. Patient is on Eliquis  for stroke prevention.   Patient returns for follow up for atrial fibrillation. The device clinic received an alert for an ongoing afib episode start 7/14. There were no specific triggers for her afib that she could identify. No bleeding issues on anticoagulation.   Today, she denies symptoms of palpitations, chest pain, orthopnea, PND, lower extremity edema, dizziness, presyncope, syncope, snoring, daytime somnolence, bleeding, or neurologic sequela. The patient is tolerating medications without difficulties and is otherwise without complaint today.    Atrial Fibrillation Risk Factors:  she does not have symptoms or diagnosis of sleep apnea. she does not have a history of rheumatic fever. she does not have a history of alcohol use. The patient does not have a history of early familial atrial fibrillation or other arrhythmias.  Atrial Fibrillation Management history:  Previous antiarrhythmic drugs: none Previous cardioversions: 07/30/18, 11/28/21, 03/13/24 Previous ablations: none Anticoagulation history: Eliquis   ROS- All systems are reviewed and negative except as per the HPI above.  Past Medical History:  Diagnosis Date   Bundle branch block, left    Dyslipidemia    GERD (gastroesophageal reflux disease)    Hypercholesteremia    Hypertension    LBBB (left bundle branch block)    Pacemaker    Paroxysmal atrial fibrillation (HCC)    UTI (urinary tract infection)     Current Outpatient Medications  Medication Sig  Dispense Refill   ALPRAZolam  (XANAX ) 0.5 MG tablet Take 1 tablet (0.5 mg total) by mouth 2 (two) times daily as needed for anxiety. 30 tablet 2   Cholecalciferol (VITAMIN D3) 50 MCG (2000 UT) TABS Take 2,000 Units by mouth daily.     diltiazem  (CARDIZEM  CD) 120 MG 24 hr capsule Take 1 capsule (120 mg total) by mouth daily. 90 capsule 3   ELIQUIS  5 MG TABS tablet TAKE 1 TABLET BY MOUTH TWICE DAILY 180 tablet 1   hydrochlorothiazide  (MICROZIDE ) 12.5 MG capsule Take 1 capsule (12.5 mg total) by mouth daily. 90 capsule 3   losartan  (COZAAR ) 100 MG tablet TAKE 1 TABLET BY MOUTH DAILY GENERIC EQUIVALENT FOR COZAAR  90 tablet 1   metoprolol  tartrate (LOPRESSOR ) 50 MG tablet Take 1 tablet (50 mg total) by mouth 2 (two) times daily. 180 tablet 3   ondansetron  (ZOFRAN ) 4 MG tablet Take 1 tablet (4 mg total) by mouth every 8 (eight) hours as needed for nausea or vomiting. 20 tablet 1   pantoprazole  (PROTONIX ) 40 MG tablet TAKE 1 TABLET BY MOUTH ONCE DAILY IN THE MORNING -  IF  SYMPTOMS  RETURN  GO  BACK  TO  TWICE  DAILY 60 tablet 3   No current facility-administered medications for this encounter.    Physical Exam: BP 118/80   Pulse 60   Ht 5' 5 (1.651 m)   Wt 68.9 kg   BMI 25.29 kg/m   GEN: Well nourished, well developed in no acute distress NECK: No JVD; No carotid bruits CARDIAC: Regular rate and rhythm, no murmurs, rubs, gallops RESPIRATORY:  Clear to auscultation without rales,  wheezing or rhonchi  ABDOMEN: Soft, non-tender, non-distended EXTREMITIES:  No edema; No deformity   Wt Readings from Last 3 Encounters:  04/17/24 68.9 kg  04/06/24 66.2 kg  03/26/24 68 kg     EKG today demonstrates  V pacing with underlying afib Vent. rate 60 BPM PR interval * ms QRS duration 136 ms QT/QTcB 462/462 ms   Echo 09/09/23 demonstrated   1. Left ventricular ejection fraction, by estimation, is 50%. The left  ventricle has mildly decreased function. The left ventricle demonstrates  global  hypokinesis with septal-lateral dyssynchrony consistent with LBBB.  There is mild concentric left ventricular hypertrophy. Left ventricular diastolic parameters are consistent with Grade II diastolic dysfunction (pseudonormalization).   2. Right ventricular systolic function is mildly reduced. The right  ventricular size is mildly enlarged. There is normal pulmonary artery  systolic pressure. The estimated right ventricular systolic pressure is  25.1 mmHg.   3. Left atrial size was moderately dilated.   4. The mitral valve is degenerative. No evidence of mitral valve  regurgitation. No evidence of mitral stenosis. The mean mitral valve  gradient is 2.5 mmHg. Moderate mitral annular calcification.   5. The aortic valve is tricuspid. There is mild calcification of the  aortic valve. Aortic valve regurgitation is not visualized. No aortic  stenosis is present.   6. The inferior vena cava is normal in size with greater than 50%  respiratory variability, suggesting right atrial pressure of 3 mmHg.   7. A small pericardial effusion is present.    CHA2DS2-VASc Score = 4  The patient's score is based upon: CHF History: 0 HTN History: 1 Diabetes History: 0 Stroke History: 0 Vascular Disease History: 0 Age Score: 2 Gender Score: 1       ASSESSMENT AND PLAN: Persistent Atrial Fibrillation (ICD10:  I48.19) The patient's CHA2DS2-VASc score is 4, indicating a 4.8% annual risk of stroke.   S/p DCCV 03/13/24 with quick return of afib.  We discussed rhythm control options today including AAD and ablation. She is interested in pursuing ablation, will refer to EP. Short term, will start amiodarone 200 mg BID x 4 weeks then decrease to once daily as a bridge to ablation. If she does not chemically convert, will plan for repeat DCCV.  Continue diltiazem  120 mg daily Continue Lopressor  50 mg BID Continue Eliquis  5 mg BID  Secondary Hypercoagulable State (ICD10:  D68.69) The patient is at significant  risk for stroke/thromboembolism based upon her CHA2DS2-VASc Score of 4.  Continue Apixaban  (Eliquis ). No bleeding issues.   HTN Stable on current regimen  CHB S/p PPM, followed by Dr Waddell    Follow up in the AF clinic in 3 weeks.        [facility] [address] [place], [place] [address] [phone]

## 2024-04-17 NOTE — Patient Instructions (Signed)
 Start Amiodarone 200 mg twice a day for 30 days then decrease to 200 mg once a day

## 2024-04-24 ENCOUNTER — Telehealth (HOSPITAL_COMMUNITY): Payer: Self-pay | Admitting: *Deleted

## 2024-04-24 NOTE — Telephone Encounter (Signed)
 Pt called reporting nausea and vomiting since starting Amiodarone  bid last week . Discuss with Daril Kicks P.A. he recommends taking once daily to see if it improves and call us  Monday with update. Pt agrees to plan and will call with update.

## 2024-04-27 NOTE — Telephone Encounter (Signed)
 Spoke with pt today for update and she is feeling much better. She thinks the nausea and vomiting was a stomach bug. She wanted to proceed with original plan of starting amiodarone  bid. I spoke with Thom Heinrich P.A. he agrees pt can resume bid. Pt will call back if any nausea and vomiting returns otherwise Pt will follow up with her next appt 05/13/24.

## 2024-04-30 ENCOUNTER — Ambulatory Visit: Payer: Medicare Other

## 2024-05-01 MED ORDER — AMIODARONE HCL 200 MG PO TABS: 100.0000 mg | ORAL_TABLET | Freq: Every day | ORAL | Status: DC

## 2024-05-01 NOTE — Addendum Note (Signed)
 Addended by: Istvan Behar R on: 05/01/2024 09:24 AM   Modules accepted: Orders

## 2024-05-01 NOTE — Telephone Encounter (Signed)
 Pt called back again today that nausea and vomiting returned because of amiodarone  but in NSR. Discussed with Ricky Fenton P.A. recommends another reduce dose of 100 mg daily or pt may stop. Pt wants to try to stay on because she thinks it is working. She wants to try 100 mg daily. She will call back if needed.

## 2024-05-05 NOTE — Progress Notes (Signed)
 Remote pacemaker transmission.

## 2024-05-11 NOTE — Progress Notes (Signed)
 Electrophysiology Office Note:    Date:  05/12/2024   ID:  Lisa [name], DOB 05/31/1941, MRN [id number]  PCP:  Purcell Emil Schanz, MD   [facility] Providers Cardiologist:  Peter Swaziland, MD Electrophysiologist:  Danelle Birmingham, MD     Referring MD: Purcell Emil Schanz, *   History of Present Illness:    Lisa [name] is a 83 y.o. female with a medical history significant for atrial fibrillation, complete heart block with Biotronik pacemaker, referred for management of atrial fibrillation.       Discussed the use of AI scribe software for clinical note transcription with the patient, who gave verbal consent to proceed.  History of Present Illness Lisa [name] is an 83 year old female with atrial fibrillation and heart block who presents for management of atrial fibrillation. She was referred by the atrial fibrillation clinic for management of atrial fibrillation.  She has a Biotronik pacemaker for heart block and has undergone multiple cardioversions (07/2018, 11/2021), with the most recent in June 2025. Atrial fibrillation recurred in June 2023. She started amiodarone  in July but experienced severe vomiting, leading to a reduced dosage, now on half a pill daily and still with some nausea  She experiences variable energy levels, palpitations, and ankle swelling, when in atrial fibrillation.   She consistently takes Eliquis  without missing doses. She remains active, engaging in activities like trimming shrubbery.         Today, she is at baseline with atrial fibrillation --some fatigue, shortness of breath  EKGs/Labs/Other Studies Reviewed Today:     Echocardiogram:  TTE December 2024 LVEF 50%.  Global hypokinesis with septal dyssynchrony due to pacing.  Mild concentric LVH.  Grade 2 diastolic dysfunction.  Right ventricle is mildly enlarged.  Left atrium is mildly dilated.     EKG:   EKG Interpretation Date/Time:  Tuesday May 12 2024 07:48:53  EDT Ventricular Rate:  60 PR Interval:    QRS Duration:  116 QT Interval:  430 QTC Calculation: 430 R Axis:   65  Text Interpretation: Ventricular-paced rhythm When compared with ECG of 17-Apr-2024 09:43, No significant change was found Confirmed by Nancey Scotts [phone]) on 05/12/2024 8:08:19 AM     Physical Exam:    VS:  BP (!) 154/80   Pulse 60   Ht 5' 5 (1.651 m)   Wt 150 lb 11.2 oz (68.4 kg)   SpO2 94%   BMI 25.08 kg/m     Wt Readings from Last 3 Encounters:  05/12/24 150 lb 11.2 oz (68.4 kg)  04/17/24 152 lb (68.9 kg)  04/06/24 146 lb (66.2 kg)     GEN:  Well nourished, well developed in no acute distress CARDIAC: RRR, no murmurs, rubs, gallops RESPIRATORY:  Normal work of breathing MUSCULOSKELETAL: No appreciable edema    ASSESSMENT & PLAN:     Persistent atrial fibrillation She has had 3 cardioversions over the years, most recently June 2025 She is symptomatic with fatigue, dependent edema, and palpitations Amiodarone  was started July 2025 -- not tolerated due to nausea For now, continue diltiazem  120 mg daily, Lopressor  50 mg daily We discussed management options -- she would like to pursue rhythm control. I do not think she would be a great candidate for Tikosyn due to her renal dysfunction.  Additionally, she would like to avoid additional medications.  Using a shared decision making approach, we decided to proceed with ablation.  We discussed the indication, rationale, logistics, anticipated benefits, and potential risks  of the ablation procedure including but not limited to -- bleed at the groin access site, chest pain, damage to nearby organs such as the diaphragm, lungs, or esophagus, need for a drainage tube, or prolonged hospitalization. I explained that the risk for stroke, heart attack, need for open chest surgery, or even death is very low but not zero. she  expressed understanding and wishes to proceed.   Secondary hypercoagulable  state CHA2DS2-VASc score is 4 Continue Eliquis  5 mg twice daily  Complete heart block Biotronik dual-chamber pacemaker functioning normally      Signed, Eulas FORBES Furbish, MD  05/12/2024 8:11 AM    [facility]

## 2024-05-11 NOTE — H&P (View-Only) (Signed)
 Electrophysiology Office Note:    Date:  05/12/2024   ID:  Lisa [name], DOB 05/31/1941, MRN [id number]  PCP:  Purcell Emil Schanz, MD   [facility] Providers Cardiologist:  Peter Swaziland, MD Electrophysiologist:  Danelle Birmingham, MD     Referring MD: Purcell Emil Schanz, *   History of Present Illness:    Lisa [name] is a 83 y.o. female with a medical history significant for atrial fibrillation, complete heart block with Biotronik pacemaker, referred for management of atrial fibrillation.       Discussed the use of AI scribe software for clinical note transcription with the patient, who gave verbal consent to proceed.  History of Present Illness Lisa [name] is an 83 year old female with atrial fibrillation and heart block who presents for management of atrial fibrillation. She was referred by the atrial fibrillation clinic for management of atrial fibrillation.  She has a Biotronik pacemaker for heart block and has undergone multiple cardioversions (07/2018, 11/2021), with the most recent in June 2025. Atrial fibrillation recurred in June 2023. She started amiodarone  in July but experienced severe vomiting, leading to a reduced dosage, now on half a pill daily and still with some nausea  She experiences variable energy levels, palpitations, and ankle swelling, when in atrial fibrillation.   She consistently takes Eliquis  without missing doses. She remains active, engaging in activities like trimming shrubbery.         Today, she is at baseline with atrial fibrillation --some fatigue, shortness of breath  EKGs/Labs/Other Studies Reviewed Today:     Echocardiogram:  TTE December 2024 LVEF 50%.  Global hypokinesis with septal dyssynchrony due to pacing.  Mild concentric LVH.  Grade 2 diastolic dysfunction.  Right ventricle is mildly enlarged.  Left atrium is mildly dilated.     EKG:   EKG Interpretation Date/Time:  Tuesday May 12 2024 07:48:53  EDT Ventricular Rate:  60 PR Interval:    QRS Duration:  116 QT Interval:  430 QTC Calculation: 430 R Axis:   65  Text Interpretation: Ventricular-paced rhythm When compared with ECG of 17-Apr-2024 09:43, No significant change was found Confirmed by Nancey Scotts [phone]) on 05/12/2024 8:08:19 AM     Physical Exam:    VS:  BP (!) 154/80   Pulse 60   Ht 5' 5 (1.651 m)   Wt 150 lb 11.2 oz (68.4 kg)   SpO2 94%   BMI 25.08 kg/m     Wt Readings from Last 3 Encounters:  05/12/24 150 lb 11.2 oz (68.4 kg)  04/17/24 152 lb (68.9 kg)  04/06/24 146 lb (66.2 kg)     GEN:  Well nourished, well developed in no acute distress CARDIAC: RRR, no murmurs, rubs, gallops RESPIRATORY:  Normal work of breathing MUSCULOSKELETAL: No appreciable edema    ASSESSMENT & PLAN:     Persistent atrial fibrillation She has had 3 cardioversions over the years, most recently June 2025 She is symptomatic with fatigue, dependent edema, and palpitations Amiodarone  was started July 2025 -- not tolerated due to nausea For now, continue diltiazem  120 mg daily, Lopressor  50 mg daily We discussed management options -- she would like to pursue rhythm control. I do not think she would be a great candidate for Tikosyn due to her renal dysfunction.  Additionally, she would like to avoid additional medications.  Using a shared decision making approach, we decided to proceed with ablation.  We discussed the indication, rationale, logistics, anticipated benefits, and potential risks  of the ablation procedure including but not limited to -- bleed at the groin access site, chest pain, damage to nearby organs such as the diaphragm, lungs, or esophagus, need for a drainage tube, or prolonged hospitalization. I explained that the risk for stroke, heart attack, need for open chest surgery, or even death is very low but not zero. she  expressed understanding and wishes to proceed.   Secondary hypercoagulable  state CHA2DS2-VASc score is 4 Continue Eliquis  5 mg twice daily  Complete heart block Biotronik dual-chamber pacemaker functioning normally      Signed, Eulas FORBES Furbish, MD  05/12/2024 8:11 AM    [facility]

## 2024-05-12 ENCOUNTER — Encounter: Payer: Self-pay | Admitting: Cardiovascular Disease

## 2024-05-12 ENCOUNTER — Ambulatory Visit: Attending: Cardiovascular Disease | Admitting: Cardiovascular Disease

## 2024-05-12 VITALS — BP 154/80 | HR 60 | Ht 65.0 in | Wt 150.7 lb

## 2024-05-12 DIAGNOSIS — I480 Paroxysmal atrial fibrillation: Secondary | ICD-10-CM | POA: Diagnosis not present

## 2024-05-12 DIAGNOSIS — Z79899 Other long term (current) drug therapy: Secondary | ICD-10-CM

## 2024-05-12 DIAGNOSIS — Z01812 Encounter for preprocedural laboratory examination: Secondary | ICD-10-CM

## 2024-05-12 MED ORDER — DRONEDARONE HCL 400 MG PO TABS: 400.0000 mg | ORAL_TABLET | Freq: Two times a day (BID) | ORAL | 3 refills | Status: DC

## 2024-05-12 NOTE — Patient Instructions (Signed)
 Medication Instructions:  Your physician has recommended you make the following change in your medication:   ** Stop Amiodarone   ** Begin Dronedarone  400mg  - 1 tablet by mouth twice daily  *If you need a refill on your cardiac medications before your next appointment, please call your pharmacy*  Lab Work: CBC and BMET - please have pre-procedure lab work completed on 06/24/2024 . This can be done at ANY [facility] near you - no appointment required and this does not have to be fasting. If you have labs (blood work) drawn today and your tests are completely normal, you will receive your results only by: MyChart Message (if you have MyChart) OR A paper copy in the mail If you have any lab test that is abnormal or we need to change your treatment, we will call you to review the results.  Testing/Procedures: Cardiac CT - someone will contact you to schedule this  Your physician has requested that you have cardiac CT. Cardiac computed tomography (CT) is a painless test that uses an x-ray machine to take clear, detailed pictures of your heart. For further information please visit [email or web address] Please follow instruction sheet as given.   Atrial Fibrillation Ablation - scheduled on Tuesday 07/14/2024 We will be in contact closer to your ablation date with further instructions Your physician has recommended that you have an ablation. Catheter ablation is a medical procedure used to treat some cardiac arrhythmias (irregular heartbeats). During catheter ablation, a long, thin, flexible tube is put into a blood vessel in your groin (upper thigh), or neck. This tube is called an ablation catheter. It is then guided to your heart through the blood vessel. Radio frequency waves destroy small areas of heart tissue where abnormal heartbeats may cause an arrhythmia to start. Please see the instruction sheet given to you today.  Follow-Up: At [facility], you and your health needs are our  priority.  As part of our continuing mission to provide you with exceptional heart care, our providers are all part of one team.  This team includes your primary Cardiologist (physician) and Advanced Practice Providers or APPs (Physician Assistants and Nurse Practitioners) who all work together to provide you with the care you need, when you need it.  Your next appointment:   We will schedule follow up after your ablation  Provider:   You may see Dr Nancey and Dr Waddell or one of the following Advanced Practice Providers on your designated Care Team:   Charlies Arthur, PA-C Ozell Jodie Passey, PA-C Suzann Riddle, NP Daphne Barrack, NP   Cardiac Ablation Cardiac ablation is a procedure to destroy, or ablate, a small amount of heart tissue that is causing problems. The heart has many electrical connections. Sometimes, these connections are abnormal and can cause the heart to beat very fast or irregularly. Ablating the abnormal areas can improve the heart's rhythm or return it to normal. Ablation may be done for people who: Have irregular or rapid heartbeats (arrhythmias). Have Wolff-Parkinson-White syndrome. Have taken medicines for an arrhythmia that did not work or caused side effects. Have a high-risk heartbeat that may be life-threatening. Tell a health care provider about: Any allergies you have. All medicines you are taking, including vitamins, herbs, eye drops, creams, and over-the-counter medicines. Any problems you or family members have had with anesthesia. Any bleeding problems you have. Any surgeries you have had. Any medical conditions you have. Whether you are pregnant or may be pregnant. What are the risks? Your health care provider  will talk with you about risks. These may include: Infection. Bruising and bleeding. Stroke or blood clots. Damage to nearby structures or organs. Allergic reaction to medicines or dyes. Needing a pacemaker if the heart gets damaged. A  pacemaker is a device that helps the heart beat normally. Failure of the procedure. A repeat procedure may be needed. What happens before the procedure? Medicines Ask your health care provider about: Changing or stopping your regular medicines. These include any heart rhythm medicines, diabetes medicines, or blood thinners you take. Taking medicines such as aspirin and ibuprofen. These medicines can thin your blood. Do not take them unless your health care provider tells you to. Taking over-the-counter medicines, vitamins, herbs, and supplements. General instructions Follow instructions from your health care provider about what you may eat and drink. If you will be going home right after the procedure, plan to have a responsible adult: Take you home from the hospital or clinic. You will not be allowed to drive. Care for you for the time you are told. Ask your health care provider what steps will be taken to prevent infection. What happens during the procedure?  An IV will be inserted into one of your veins. You may be given: A sedative. This helps you relax. Anesthesia. This will: Numb certain areas of your body. An incision will be made in your neck or your groin. A needle will be inserted through the incision and into a large vein in your neck or groin. The small, thin tube (catheter) will be inserted through the needle and moved to your heart. A type of X-ray (fluoroscopy) will be used to help guide the catheter and provide images of the heart on a monitor. Dye may be injected through the catheter to help your surgeon see the area of the heart that needs treatment. Electrical currents will be sent from the catheter to destroy heart tissue in certain areas. There are three types of energy that may be used to do this: Heat (radiofrequency energy). Laser energy. Extreme cold (cryoablation). When the tissue has been destroyed, the catheter will be removed. Pressure will be held on the  insertion area to prevent bleeding. A bandage (dressing) will be placed over the insertion area. The procedure may vary among health care providers and hospitals. What happens after the procedure? Your blood pressure, heart rate and rhythm, breathing rate, and blood oxygen level will be monitored until you leave the hospital or clinic. Your insertion area will be checked for bleeding. You will need to lie still for a few hours. If your groin was used, you will need to keep your leg straight for a few hours after the catheter is removed. This information is not intended to replace advice given to you by your health care provider. Make sure you discuss any questions you have with your health care provider. Document Revised: 02/27/2022 Document Reviewed: 02/27/2022 Elsevier Patient Education  2024 [employer].

## 2024-05-12 NOTE — Addendum Note (Signed)
 Addended by: CASIMIR ALDONA BRAVO on: 05/12/2024 09:48 AM   Modules accepted: Orders

## 2024-05-13 ENCOUNTER — Ambulatory Visit (HOSPITAL_COMMUNITY): Admitting: Physician Assistant

## 2024-05-17 ENCOUNTER — Encounter: Payer: Self-pay | Admitting: Cardiovascular Disease

## 2024-05-20 ENCOUNTER — Ambulatory Visit: Payer: Self-pay

## 2024-05-20 ENCOUNTER — Encounter: Payer: Self-pay | Admitting: Emergency Medicine

## 2024-05-20 ENCOUNTER — Encounter: Payer: Self-pay | Admitting: Cardiology

## 2024-05-20 DIAGNOSIS — I480 Paroxysmal atrial fibrillation: Secondary | ICD-10-CM | POA: Diagnosis not present

## 2024-05-20 DIAGNOSIS — Z01812 Encounter for preprocedural laboratory examination: Secondary | ICD-10-CM | POA: Diagnosis not present

## 2024-05-20 LAB — CBC
Hematocrit: 35.8 % (ref 34.0–46.6)
Hemoglobin: 11.2 g/dL (ref 11.1–15.9)
MCH: 26.7 pg (ref 26.6–33.0)
MCHC: 31.3 g/dL — ABNORMAL LOW (ref 31.5–35.7)
MCV: 85 fL (ref 79–97)
Platelets: 290 x10E3/uL (ref 150–450)
RBC: 4.19 x10E6/uL (ref 3.77–5.28)
RDW: 14.1 % (ref 11.7–15.4)
WBC: 8 x10E3/uL (ref 3.4–10.8)

## 2024-05-20 NOTE — Telephone Encounter (Signed)
 FYI Only or Action Required?: FYI only for provider.  Patient was last seen in primary care on 04/06/2024 by Purcell Emil Schanz, MD.  Called Nurse Triage reporting Pain.  Symptoms began a week ago.  Interventions attempted: Nothing.  Symptoms are: gradually worsening.  Triage Disposition: See Physician Within 24 Hours  Patient/caregiver understands and will follow disposition?: Yes   Copied from CRM [id number]. Topic: Clinical - Red Word Triage >> May 20, 2024  4:10 PM Rea ORN wrote: Red Word that prompted transfer to Nurse Triage: pt called to advise she is in pain and feels like her bladder is going to fall out. Reason for Disposition  MODERATE-SEVERE itching (i.e., interferes with school, work, or sleep)  Answer Assessment - Initial Assessment Questions 1. SYMPTOM: What's the main symptom you're concerned about? (e.g., pain, itching, dryness)     Pain, feels like my bladder is being pushed out 2. LOCATION: Where is the   located? (e.g., inside/outside, left/right)     Vagina inside has pain 3. ONSET: When did the   start?     X 4 or 5 days 4. PAIN: Is there any pain? If Yes, ask: How bad is it? (Scale: 1-10; mild, moderate, severe)     3/10 5. ITCHING: Is there any itching? If Yes, ask: How bad is it? (Scale: 1-10; mild, moderate, severe)     no 6. CAUSE: What do you think is causing the discharge? Have you had the same problem before? What happened then?     no 7. OTHER SYMPTOMS: Do you have any other symptoms? (e.g., fever, itching, vaginal bleeding, pain with urination, injury to genital area, vaginal foreign body)     Increased frequency and urgency 8. PREGNANCY: Is there any chance you are pregnant? When was your last menstrual period?     N/a  Protocols used: Vaginal Symptoms-A-AH

## 2024-05-20 NOTE — Telephone Encounter (Signed)
 Spoke to patient advised January schedule is not opened yet.Follow up appointment scheduled with Dr.Jordan 12/18 at 9:20 am.

## 2024-05-21 ENCOUNTER — Ambulatory Visit: Payer: Self-pay

## 2024-05-21 ENCOUNTER — Encounter: Payer: Self-pay | Admitting: Emergency Medicine

## 2024-05-21 ENCOUNTER — Ambulatory Visit (INDEPENDENT_AMBULATORY_CARE_PROVIDER_SITE_OTHER): Admitting: Emergency Medicine

## 2024-05-21 VITALS — BP 118/68 | HR 61 | Temp 98.2°F | Ht 65.0 in | Wt 151.0 lb

## 2024-05-21 DIAGNOSIS — N8110 Cystocele, unspecified: Secondary | ICD-10-CM | POA: Insufficient documentation

## 2024-05-21 DIAGNOSIS — N1832 Chronic kidney disease, stage 3b: Secondary | ICD-10-CM | POA: Insufficient documentation

## 2024-05-21 DIAGNOSIS — I480 Paroxysmal atrial fibrillation: Secondary | ICD-10-CM | POA: Diagnosis not present

## 2024-05-21 LAB — BASIC METABOLIC PANEL WITH GFR
BUN/Creatinine Ratio: 15 (ref 12–28)
BUN: 21 mg/dL (ref 8–27)
CO2: 22 mmol/L (ref 20–29)
Calcium: 9.5 mg/dL (ref 8.7–10.3)
Chloride: 101 mmol/L (ref 96–106)
Creatinine, Ser: 1.4 mg/dL — ABNORMAL HIGH (ref 0.57–1.00)
Glucose: 80 mg/dL (ref 70–99)
Potassium: 3.8 mmol/L (ref 3.5–5.2)
Sodium: 138 mmol/L (ref 134–144)
eGFR: 38 mL/min/1.73 — ABNORMAL LOW (ref >59)

## 2024-05-21 NOTE — Patient Instructions (Signed)
 Pelvic Organs That Bulge or Drop (Pelvic Organ Prolapse): What to Know  Pelvic organ prolapse is when organs in the pelvis stretch, bulge, or drop into a position that isn't normal. It happens when the muscles and tissues that support the pelvic organs become weak or stretched. There're several types of pelvic organ prolapse. They are: Prolapse of the vagina. Prolapse of the uterus. Prolapse of the bladder. Prolapse of the rectum (rectocele). Prolapse of the intestines (enterocele). When organs other than the vagina are involved, they often bulge into the vagina or stick out from the vagina. How bad the prolapse is depends on how much the organs stick out. What are the causes? Pregnancy, labor, and giving birth to a baby. Past pelvic floor injury or surgery. Menopause. This is a stage in life when a female no longer has a period. Genetic diseases that weaken muscles and tissue. Having obesity. Long-term trouble pooping (chronic constipation). Hysterectomy. This is when the uterus is taken out. Smoking, lung disease, and long-term cough. Heavy lifting. Heavy exercises that put stress on bones and joints. What are the signs or symptoms? Leaking a little pee when you cough, sneeze, strain, or exercise. This is called stress incontinence. This may be worse right after giving birth to a baby. It may get better over time. Feeling pressure in your pelvis or vagina. You may feel more pressure when you cough or poop. A bulge that sticks out from the opening of your vagina or in the vagina. Trouble peeing or pooping. Pain in your lower back. Pain, discomfort, or leaking pee during sex. Having more than one bladder infection, also called urinary tract infection. Some people have no symptoms. How is this diagnosed? A prolapse may be diagnosed based on an exam of the vagina or the butt. During the exam, you may be asked to cough and strain while you are lying down, sitting, and standing up.   Bladder function tests may also be done. How is this treated? Treatment for pelvic organ prolapse may depend on your symptoms. Treatment may include: Lifestyle changes, such as drinking plenty of fluids and eating foods that are high in fiber. This makes it easier to poop. Peeing at specific times. This is called bladder training. This can help if you leak pee. Estrogen. This may help to make your pelvic floor muscles strong. Exercises. Kegels may help to make the muscles of the pelvic floor strong and tight. Yoga and Pilates can make the muscles of the belly strong. Biofeedback. This is doing physical therapy and using a device to help you tighten the muscles of the pelvic floor. Pelvic floor stimulators. These devices help strengthen the pelvic floor muscles if you can't do Kegels. Pessary. This is a soft, flexible device that helps support the walls of the vagina. It keeps the pelvic organs in place. Surgery. This may be needed to treat really bad prolapse. Follow these instructions at home: Activity Lose weight as told. Avoid heavy lifting and straining when you exercise or work. Do not hold your breath when you exercise or lift. Limit your activities as told. Do Kegel exercises as told. General instructions Take your medicines as told. Wear a pad or adult diapers if you leak pee. If you have a pessary, take care of it as told. Contact a health care provider if: You have symptoms that get in the way of your daily activities or sex life. You have bleeding from your vagina, and the bleeding isn't from a period. You have  a fever, bad smelling discharge from your vagina, or other signs of infection. You have pain or bleeding when you pee. You have trouble pooping. You have a pessary that falls out. You have a new, low pain in your belly. Get help right away if: You can't pee. This information is not intended to replace advice given to you by your health care provider. Make sure you  discuss any questions you have with your health care provider. Document Revised: 07/22/2023 Document Reviewed: 07/22/2023 Elsevier Patient Education  2025 [employer].

## 2024-05-21 NOTE — Assessment & Plan Note (Addendum)
 Chronic problem.  Needs urology evaluation Referral placed today. Symptom management discussed Stable without complications

## 2024-05-21 NOTE — Assessment & Plan Note (Signed)
 Recent cardioversion.   Well-controlled heart rate.  Sinus rhythm today. On long-term anticoagulation with Eliquis  5 mg twice a day Continues beta-blocker with metoprolol  tartrate 50 mg twice a day No clinical bleeding episodes Fall precautions given

## 2024-05-21 NOTE — Assessment & Plan Note (Signed)
 Advised to stay well-hydrated and avoid NSAIDs Recommend evaluation by nephrologist Referral placed today.

## 2024-05-21 NOTE — Progress Notes (Signed)
 Lisa [name] 83 y.o.   Chief Complaint  Patient presents with   Vaginal Pain    Patient here for vaginal pain. That started a week ago, she states it feels like its going to fall out, she mentions it is protruding out just a little, today is not as bad. She also mentions having a cardio version in June and it did not work, she is scheduled to get an ablation oct 21.     HISTORY OF PRESENT ILLNESS: This is a 83 y.o. female complaining of vaginal protrusion feeling like it is going to follow-up for the last couple of weeks Has history of chronic A-fib.  Scheduled for ablation October 21 No other complaints or medical concerns today. Also recently found out her kidney function has been deteriorating recently.  Vaginal Pain Pertinent negatives include no abdominal pain, chills, diarrhea, fever, headaches, nausea, rash, sore throat or vomiting.     Prior to Admission medications   Medication Sig Start Date End Date Taking? Authorizing Provider  ALPRAZolam  (XANAX ) 0.5 MG tablet Take 1 tablet (0.5 mg total) by mouth 2 (two) times daily as needed for anxiety. 04/06/24  Yes Senovia Gauer, Emil Schanz, MD  Cholecalciferol (VITAMIN D3) 50 MCG (2000 UT) TABS Take 2,000 Units by mouth daily.   Yes [provider]  diltiazem  (CARDIZEM  CD) 120 MG 24 hr capsule Take 1 capsule (120 mg total) by mouth daily. 11/19/23 05/21/24 Yes [place], Peter M, MD  dronedarone  (MULTAQ ) 400 MG tablet Take 1 tablet (400 mg total) by mouth 2 (two) times daily with a meal. 05/12/24  Yes Mealor, Augustus E, MD  ELIQUIS  5 MG TABS tablet TAKE 1 TABLET BY MOUTH TWICE DAILY 12/23/23  Yes [place], Peter M, MD  hydrochlorothiazide  (MICROZIDE ) 12.5 MG capsule Take 1 capsule (12.5 mg total) by mouth daily. 12/12/23 05/21/24 Yes [place], Peter M, MD  losartan  (COZAAR ) 100 MG tablet TAKE 1 TABLET BY MOUTH DAILY GENERIC EQUIVALENT FOR COZAAR  12/23/23  Yes [place], Peter M, MD  metoprolol  tartrate (LOPRESSOR ) 50 MG tablet Take 1 tablet (50  mg total) by mouth 2 (two) times daily. 11/19/23 05/21/24 Yes [place], Peter M, MD  ondansetron  (ZOFRAN ) 4 MG tablet Take 1 tablet (4 mg total) by mouth every 8 (eight) hours as needed for nausea or vomiting. 04/06/24  Yes Michaelpaul Apo, Emil Schanz, MD  pantoprazole  (PROTONIX ) 40 MG tablet TAKE 1 TABLET BY MOUTH ONCE DAILY IN THE MORNING -  IF  SYMPTOMS  RETURN  GO  BACK  TO  TWICE  DAILY 03/09/24  Yes Armbruster, Elspeth SQUIBB, MD    Allergies  Allergen Reactions   Codeine Nausea And Vomiting and Other (See Comments)    GI upset    Fosamax [Alendronate Sodium] Other (See Comments)    Tooth problems   Lisinopril Cough   Olmesartan Other (See Comments)    Possible photodermatitis    Amlodipine Swelling    edema     Patient Active Problem List   Diagnosis Date Noted   Situational anxiety 04/06/2024   Nausea without vomiting 10/08/2023   Chronic diastolic CHF (congestive heart failure) (HCC) 09/08/2023   Statin intolerance 03/26/2023   Thrombophilia (HCC) 09/25/2022   Current use of long term anticoagulation 09/25/2022   Dyslipidemia 03/14/2022   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   Age-related osteoporosis without current pathological fracture 11/27/2021   Chronic kidney disease, stage 3a (HCC) 11/27/2021   Essential hypertension 11/27/2021   Impaired fasting glucose 11/27/2021   Mixed hyperlipidemia 11/27/2021  Personal history of malignant neoplasm of breast 11/27/2021   Personal history of urinary calculi 11/27/2021   Vitamin D deficiency 11/27/2021   Changing skin lesion 05/12/2021   Actinic keratosis 02/24/2019   Paroxysmal atrial fibrillation (HCC)    GERD (gastroesophageal reflux disease)    Left bundle branch block     Past Medical History:  Diagnosis Date   Bundle branch block, left    Dyslipidemia    GERD (gastroesophageal reflux disease)    Hypercholesteremia    Hypertension    LBBB (left bundle branch block)    Pacemaker    Paroxysmal atrial fibrillation (HCC)     UTI (urinary tract infection)     Past Surgical History:  Procedure Laterality Date   bladder polyp removal     BREAST EXCISIONAL BIOPSY Right    60 yrs ago- Benign   BREAST LUMPECTOMY Left    No Visible Scar, said 30 + years ago in situ, no chemo, no radiation, or hormone replacement     CARDIOVERSION N/A 07/30/2018   Procedure: CARDIOVERSION;  Surgeon: Jeffrie Oneil BROCKS, MD;  Location: [facility] ENDOSCOPY;  Service: Cardiovascular;  Laterality: N/A;   CARDIOVERSION N/A 11/28/2021   Procedure: CARDIOVERSION;  Surgeon: Waddell Danelle ORN, MD;  Location: [facility] INVASIVE CV LAB;  Service: Cardiovascular;  Laterality: N/A;   CARDIOVERSION N/A 03/13/2024   Procedure: CARDIOVERSION;  Surgeon: Pietro Redell RAMAN, MD;  Location: [facility] INVASIVE CV LAB;  Service: Cardiovascular;  Laterality: N/A;   CESAREAN SECTION     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   Procedure: PACEMAKER IMPLANT;  Surgeon: Waddell Danelle ORN, MD;  Location: [facility] INVASIVE CV LAB;  Service: Cardiovascular;  Laterality: N/A;    Social History   Socioeconomic History   Marital status: Married    Spouse name: Not on file   Number of children: 2   Years of education: Not on file   Highest education level: Associate degree: academic program  Occupational History   Not on file  Tobacco Use   Smoking status: Former    Current packs/day: 0.00    Types: Cigarettes    Start date: 70    Quit date: 1993    Years since quitting: 32.6   Smokeless tobacco: Never   Tobacco comments:    Former smoker 04/17/24  Vaping Use   Vaping status: Never Used  Substance and Sexual Activity   Alcohol use: Yes   Drug use: No   Sexual activity: Not on file  Other Topics Concern   Not on file  Social History Narrative   Not on file   Social Drivers of Health   Financial Resource Strain: Patient Declined (04/03/2024)   Overall Financial Resource Strain (CARDIA)    Difficulty of Paying Living Expenses: Patient declined  Food Insecurity: No Food Insecurity  (04/03/2024)   Hunger Vital Sign    Worried About Running Out of Food in the Last Year: Never true    Ran Out of Food in the Last Year: Never true  Transportation Needs: Unknown (04/03/2024)   PRAPARE - Administrator, Civil Service (Medical): Not on file    Lack of Transportation (Non-Medical): No  Physical Activity: Unknown (04/03/2024)   Exercise Vital Sign    Days of Exercise per Week: 4 days    Minutes of Exercise per Session: Not on file  Stress: Stress Concern Present (04/03/2024)   [employer] of Occupational Health - Occupational Stress Questionnaire    Feeling of Stress: Very much  Social  Connections: Moderately Isolated (04/03/2024)   Social Connection and Isolation Panel    Frequency of Communication with Friends and Family: Twice a week    Frequency of Social Gatherings with Friends and Family: Once a week    Attends Religious Services: Never    Database administrator or Organizations: No    Attends Engineer, structural: Not on file    Marital Status: Married  Catering manager Violence: Not At Risk (09/08/2023)   Humiliation, Afraid, Rape, and Kick questionnaire    Fear of Current or Ex-Partner: No    Emotionally Abused: No    Physically Abused: No    Sexually Abused: No    Family History  Problem Relation Age of Onset   Breast cancer Mother    Hip fracture Mother    Ulcers Father    CVA Brother    Renal cancer Brother    Other Brother        hip replacement   CVA Brother        Had PaceMaker   Crohn's disease Brother    Diabetes Brother      Review of Systems  Constitutional: Negative.  Negative for chills and fever.  HENT: Negative.  Negative for congestion and sore throat.   Respiratory: Negative.  Negative for cough and shortness of breath.   Cardiovascular:  Positive for palpitations.  Gastrointestinal:  Negative for abdominal pain, diarrhea, nausea and vomiting.  Genitourinary:  Positive for vaginal pain.  Skin: Negative.   Negative for rash.  Neurological: Negative.  Negative for dizziness and headaches.  All other systems reviewed and are negative.   Vitals:   05/21/24 0944  BP: 118/68  Pulse: 61  Temp: 98.2 F (36.8 C)  SpO2: 96%    Physical Exam Vitals reviewed.  Constitutional:      Appearance: Normal appearance.  HENT:     Head: Normocephalic.  Eyes:     Extraocular Movements: Extraocular movements intact.  Cardiovascular:     Rate and Rhythm: Normal rate.  Pulmonary:     Effort: Pulmonary effort is normal.  Skin:    General: Skin is warm and dry.     Capillary Refill: Capillary refill takes less than 2 seconds.  Neurological:     Mental Status: She is alert and oriented to person, place, and time.  Psychiatric:        Behavior: Behavior normal.      ASSESSMENT & PLAN: I personally spent a total of 32 minutes minutes in the care of the patient today including preparing to see the patient, getting/reviewing separately obtained history, performing a medically appropriate exam/evaluation, counseling and educating, placing orders, referring and communicating with other health care professionals, documenting clinical information in the EHR, independently interpreting results, and coordinating care.  Problem List Items Addressed This Visit       Cardiovascular and Mediastinum   Paroxysmal atrial fibrillation (HCC)   Recent cardioversion.   Well-controlled heart rate.  Sinus rhythm today. On long-term anticoagulation with Eliquis  5 mg twice a day Continues beta-blocker with metoprolol  tartrate 50 mg twice a day No clinical bleeding episodes Fall precautions given          Genitourinary   Vaginal prolapse - Primary   Chronic problem.  Needs urology evaluation Referral placed today. Symptom management discussed Stable without complications      Relevant Orders   Ambulatory referral to Urology   Stage 3b chronic kidney disease (HCC)   Advised to stay well-hydrated and  avoid  NSAIDs Recommend evaluation by nephrologist Referral placed today.      Relevant Orders   Ambulatory referral to Nephrology   Patient Instructions  Pelvic Organs That Bulge or Drop (Pelvic Organ Prolapse): What to Know  Pelvic organ prolapse is when organs in the pelvis stretch, bulge, or drop into a position that isn't normal. It happens when the muscles and tissues that support the pelvic organs become weak or stretched. There're several types of pelvic organ prolapse. They are: Prolapse of the vagina. Prolapse of the uterus. Prolapse of the bladder. Prolapse of the rectum (rectocele). Prolapse of the intestines (enterocele). When organs other than the vagina are involved, they often bulge into the vagina or stick out from the vagina. How bad the prolapse is depends on how much the organs stick out. What are the causes? Pregnancy, labor, and giving birth to a baby. Past pelvic floor injury or surgery. Menopause. This is a stage in life when a female no longer has a period. Genetic diseases that weaken muscles and tissue. Having obesity. Long-term trouble pooping (chronic constipation). Hysterectomy. This is when the uterus is taken out. Smoking, lung disease, and long-term cough. Heavy lifting. Heavy exercises that put stress on bones and joints. What are the signs or symptoms? Leaking a little pee when you cough, sneeze, strain, or exercise. This is called stress incontinence. This may be worse right after giving birth to a baby. It may get better over time. Feeling pressure in your pelvis or vagina. You may feel more pressure when you cough or poop. A bulge that sticks out from the opening of your vagina or in the vagina. Trouble peeing or pooping. Pain in your lower back. Pain, discomfort, or leaking pee during sex. Having more than one bladder infection, also called urinary tract infection. Some people have no symptoms. How is this diagnosed? A prolapse may be diagnosed  based on an exam of the vagina or the butt. During the exam, you may be asked to cough and strain while you are lying down, sitting, and standing up.  Bladder function tests may also be done. How is this treated? Treatment for pelvic organ prolapse may depend on your symptoms. Treatment may include: Lifestyle changes, such as drinking plenty of fluids and eating foods that are high in fiber. This makes it easier to poop. Peeing at specific times. This is called bladder training. This can help if you leak pee. Estrogen. This may help to make your pelvic floor muscles strong. Exercises. Kegels may help to make the muscles of the pelvic floor strong and tight. Yoga and Pilates can make the muscles of the belly strong. Biofeedback. This is doing physical therapy and using a device to help you tighten the muscles of the pelvic floor. Pelvic floor stimulators. These devices help strengthen the pelvic floor muscles if you can't do Kegels. Pessary. This is a soft, flexible device that helps support the walls of the vagina. It keeps the pelvic organs in place. Surgery. This may be needed to treat really bad prolapse. Follow these instructions at home: Activity Lose weight as told. Avoid heavy lifting and straining when you exercise or work. Do not hold your breath when you exercise or lift. Limit your activities as told. Do Kegel exercises as told. General instructions Take your medicines as told. Wear a pad or adult diapers if you leak pee. If you have a pessary, take care of it as told. Contact a health care provider if: You have  symptoms that get in the way of your daily activities or sex life. You have bleeding from your vagina, and the bleeding isn't from a period. You have a fever, bad smelling discharge from your vagina, or other signs of infection. You have pain or bleeding when you pee. You have trouble pooping. You have a pessary that falls out. You have a new, low pain in your  belly. Get help right away if: You can't pee. This information is not intended to replace advice given to you by your health care provider. Make sure you discuss any questions you have with your health care provider. Document Revised: 07/22/2023 Document Reviewed: 07/22/2023 Elsevier Patient Education  2025 [employer]    Emil Schaumann, MD [facility]

## 2024-05-26 ENCOUNTER — Encounter: Payer: Self-pay | Admitting: Cardiovascular Disease

## 2024-05-27 NOTE — Telephone Encounter (Signed)
 Pt's Afib Ablation was moved from 10/21 to 9/4 with Dr. Nancey.  Labs have been completed.   I have sent her Instruction letter via MyChart and went over all Instructions.  She confirmed that she has not missed a dose of her Eliquis  in the last 4 weeks.   Pre-cert has been notified and it was approved.

## 2024-05-27 NOTE — Pre-Procedure Instructions (Signed)
 Instructed patient on the following items: Arrival time 0800 Nothing to eat or drink after midnight No meds AM of procedure Responsible person to drive you home and stay with you for 24 hrs  Have you missed any doses of anti-coagulant Eliquis - takes twice a day, hasn't missed any doses.  Don't take dose morning of proceudre.

## 2024-05-28 ENCOUNTER — Ambulatory Visit (HOSPITAL_COMMUNITY)
Admission: RE | Disposition: A | Discharge status: Home / Self Care | Payer: Self-pay | Source: Home / Self Care | Attending: Cardiovascular Disease

## 2024-05-28 ENCOUNTER — Other Ambulatory Visit: Payer: Self-pay

## 2024-05-28 ENCOUNTER — Ambulatory Visit (HOSPITAL_COMMUNITY): Payer: Self-pay

## 2024-05-28 ENCOUNTER — Ambulatory Visit (HOSPITAL_BASED_OUTPATIENT_CLINIC_OR_DEPARTMENT_OTHER): Payer: Self-pay

## 2024-05-28 ENCOUNTER — Ambulatory Visit (HOSPITAL_COMMUNITY)
Admission: RE | Admit: 2024-05-28 | Discharge: 2024-05-28 | Disposition: A | Discharge status: Home / Self Care | Attending: Cardiovascular Disease | Admitting: Cardiovascular Disease

## 2024-05-28 DIAGNOSIS — Z7901 Long term (current) use of anticoagulants: Secondary | ICD-10-CM | POA: Diagnosis not present

## 2024-05-28 DIAGNOSIS — Z87891 Personal history of nicotine dependence: Secondary | ICD-10-CM | POA: Diagnosis not present

## 2024-05-28 DIAGNOSIS — N1831 Chronic kidney disease, stage 3a: Secondary | ICD-10-CM

## 2024-05-28 DIAGNOSIS — Z950 Presence of cardiac pacemaker: Secondary | ICD-10-CM | POA: Insufficient documentation

## 2024-05-28 DIAGNOSIS — I130 Hypertensive heart and chronic kidney disease with heart failure and stage 1 through stage 4 chronic kidney disease, or unspecified chronic kidney disease: Secondary | ICD-10-CM

## 2024-05-28 DIAGNOSIS — I442 Atrioventricular block, complete: Secondary | ICD-10-CM | POA: Diagnosis not present

## 2024-05-28 DIAGNOSIS — I4819 Other persistent atrial fibrillation: Secondary | ICD-10-CM

## 2024-05-28 DIAGNOSIS — Z79899 Other long term (current) drug therapy: Secondary | ICD-10-CM | POA: Insufficient documentation

## 2024-05-28 DIAGNOSIS — D6869 Other thrombophilia: Secondary | ICD-10-CM | POA: Diagnosis not present

## 2024-05-28 DIAGNOSIS — I5032 Chronic diastolic (congestive) heart failure: Secondary | ICD-10-CM | POA: Diagnosis not present

## 2024-05-28 DIAGNOSIS — I480 Paroxysmal atrial fibrillation: Secondary | ICD-10-CM

## 2024-05-28 HISTORY — PX: ATRIAL FIBRILLATION ABLATION: EP1191

## 2024-05-28 LAB — POCT ACTIVATED CLOTTING TIME: Activated Clotting Time: 245 s

## 2024-05-28 SURGERY — ATRIAL FIBRILLATION ABLATION
Anesthesia: General

## 2024-05-28 MED ORDER — DEXAMETHASONE SODIUM PHOSPHATE 10 MG/ML IJ SOLN
INTRAMUSCULAR | Status: DC | PRN
  Administered 2024-05-28: 10 mg via INTRAVENOUS

## 2024-05-28 MED ORDER — CEFAZOLIN SODIUM-DEXTROSE 2-3 GM-%(50ML) IV SOLR
2.0000 g | Freq: Once | INTRAVENOUS | Status: AC
  Administered 2024-05-28: 2 g via INTRAVENOUS

## 2024-05-28 MED ORDER — FENTANYL CITRATE (PF) 250 MCG/5ML IJ SOLN
INTRAMUSCULAR | Status: DC | PRN
  Administered 2024-05-28 (×2): 25 ug via INTRAVENOUS
  Administered 2024-05-28: 50 ug via INTRAVENOUS

## 2024-05-28 MED ORDER — PHENYLEPHRINE 80 MCG/ML (10ML) SYRINGE FOR IV PUSH (FOR BLOOD PRESSURE SUPPORT)
PREFILLED_SYRINGE | INTRAVENOUS | Status: DC | PRN
  Administered 2024-05-28 (×2): 80 ug via INTRAVENOUS

## 2024-05-28 MED ORDER — HEPARIN (PORCINE) IN NACL 1000-0.9 UT/500ML-% IV SOLN
INTRAVENOUS | Status: DC | PRN
  Administered 2024-05-28 (×4): 500 mL

## 2024-05-28 MED ORDER — ROCURONIUM BROMIDE 10 MG/ML (PF) SYRINGE
PREFILLED_SYRINGE | INTRAVENOUS | Status: DC | PRN
  Administered 2024-05-28: 50 mg via INTRAVENOUS
  Administered 2024-05-28: 20 mg via INTRAVENOUS

## 2024-05-28 MED ORDER — PROPOFOL 10 MG/ML IV BOLUS
INTRAVENOUS | Status: DC | PRN
  Administered 2024-05-28: 100 mg via INTRAVENOUS

## 2024-05-28 MED ORDER — SODIUM CHLORIDE 0.9 % IV SOLN: INTRAVENOUS | Status: DC

## 2024-05-28 MED ORDER — HEPARIN SODIUM (PORCINE) 1000 UNIT/ML IJ SOLN
INTRAMUSCULAR | Status: DC | PRN
  Administered 2024-05-28: 4000 [IU] via INTRAVENOUS
  Administered 2024-05-28: 13000 [IU] via INTRAVENOUS

## 2024-05-28 MED ORDER — ONDANSETRON HCL 4 MG/2ML IJ SOLN
INTRAMUSCULAR | Status: DC | PRN
  Administered 2024-05-28: 4 mg via INTRAVENOUS

## 2024-05-28 MED ORDER — ATROPINE SULFATE 1 MG/10ML IJ SOSY
PREFILLED_SYRINGE | INTRAMUSCULAR | Status: DC | PRN
Start: 2024-05-28 — End: 2024-05-28
  Administered 2024-05-28: 1 mg via INTRAVENOUS

## 2024-05-28 MED ORDER — FENTANYL CITRATE (PF) 100 MCG/2ML IJ SOLN
INTRAMUSCULAR | Status: AC
Start: 2024-05-28 — End: 2024-05-28
  Filled 2024-05-28: qty 2

## 2024-05-28 MED ORDER — SUGAMMADEX SODIUM 200 MG/2ML IV SOLN
INTRAVENOUS | Status: DC | PRN
  Administered 2024-05-28: 200 mg via INTRAVENOUS

## 2024-05-28 MED ORDER — PROTAMINE SULFATE 10 MG/ML IV SOLN
INTRAVENOUS | Status: DC | PRN
  Administered 2024-05-28: 35 mg via INTRAVENOUS
  Administered 2024-05-28: 5 mg via INTRAVENOUS

## 2024-05-28 MED ORDER — ONDANSETRON HCL 4 MG/2ML IJ SOLN
4.0000 mg | Freq: Once | INTRAMUSCULAR | Status: AC
  Administered 2024-05-28: 4 mg via INTRAVENOUS
  Filled 2024-05-28: qty 2

## 2024-05-28 MED ORDER — LIDOCAINE 2% (20 MG/ML) 5 ML SYRINGE
INTRAMUSCULAR | Status: DC | PRN
  Administered 2024-05-28: 60 mg via INTRAVENOUS

## 2024-05-28 MED ORDER — CEFAZOLIN SODIUM-DEXTROSE 2-4 GM/100ML-% IV SOLN
INTRAVENOUS | Status: AC
  Filled 2024-05-28: qty 100

## 2024-05-28 SURGICAL SUPPLY — 20 items
BAG SNAP BAND KOVER 36X36 (MISCELLANEOUS) IMPLANT
BLANKET WARM UNDERBOD FULL ACC (MISCELLANEOUS) ×2 IMPLANT
CABLE FARASTAR GEN2 SNGL USE (CABLE) IMPLANT
CATH FARAWAVE 2.0 31 (CATHETERS) IMPLANT
CATH GE 8FR SOUNDSTAR (CATHETERS) IMPLANT
CATH OCTARAY 2.0 F 3-3-3-3-3 (CATHETERS) IMPLANT
CATH WEBSTER BI DIR CS D-F CRV (CATHETERS) IMPLANT
CLOSURE MYNX CONTROL 6F/7F (Vascular Products) IMPLANT
CLOSURE PERCLOSE PROSTYLE (VASCULAR PRODUCTS) IMPLANT
COVER SWIFTLINK CONNECTOR (BAG) ×2 IMPLANT
DILATOR VESSEL 38 20CM 16FR (INTRODUCER) IMPLANT
GUIDEWIRE INQWIRE 1.5J.035X260 (WIRE) IMPLANT
KIT VERSACROSS CNCT FARADRIVE (KITS) IMPLANT
PACK EP LF (CUSTOM PROCEDURE TRAY) ×2 IMPLANT
PAD DEFIB RADIO PHYSIO CONN (PAD) ×2 IMPLANT
PATCH CARTO3 (PAD) IMPLANT
SHEATH FARADRIVE STEERABLE (SHEATH) IMPLANT
SHEATH PINNACLE 8F 10CM (SHEATH) IMPLANT
SHEATH PINNACLE 9F 10CM (SHEATH) IMPLANT
SHEATH PROBE COVER 6X72 (BAG) IMPLANT

## 2024-05-28 NOTE — Anesthesia Postprocedure Evaluation (Signed)
 Anesthesia Post Note  Patient: Lisa [name]  Procedure(s) Performed: ATRIAL FIBRILLATION ABLATION     Patient location during evaluation: PACU Anesthesia Type: General Level of consciousness: awake and alert Pain management: pain level controlled Vital Signs Assessment: post-procedure vital signs reviewed and stable Respiratory status: spontaneous breathing, nonlabored ventilation, respiratory function stable and patient connected to nasal cannula oxygen Cardiovascular status: blood pressure returned to baseline and stable Postop Assessment: no apparent nausea or vomiting Anesthetic complications: no   There were no known notable events for this encounter.  Last Vitals:  Vitals:   05/28/24 1330 05/28/24 1400  BP: (!) 164/71 (!) 156/80  Pulse: 65 64  Resp: 16 15  Temp:    SpO2: 94% 96%    Last Pain:  Vitals:   05/28/24 1255  TempSrc:   PainSc: 0-No pain                 Blondell Laperle L Avary Eichenberger

## 2024-05-28 NOTE — Transfer of Care (Signed)
 Immediate Anesthesia Transfer of Care Note  Patient: Lisa [name]  Procedure(s) Performed: ATRIAL FIBRILLATION ABLATION  Patient Location: PACU and Cath Lab  Anesthesia Type:General  Level of Consciousness: awake and alert   Airway & Oxygen Therapy: Patient Spontanous Breathing and Patient connected to face mask oxygen  Post-op Assessment: Report given to RN and Post -op Vital signs reviewed and stable  Post vital signs: Reviewed and stable  Last Vitals:  Vitals Value Taken Time  BP    Temp    Pulse 63 05/28/24 12:03  Resp 19 05/28/24 12:03  SpO2 99 % 05/28/24 12:03  Vitals shown include unfiled device data.  Last Pain:  Vitals:   05/28/24 0831  TempSrc: Oral         Complications: There were no known notable events for this encounter.

## 2024-05-28 NOTE — Anesthesia Preprocedure Evaluation (Addendum)
 Anesthesia Evaluation  Patient identified by MRN, date of birth, ID band Patient awake    Reviewed: Allergy & Precautions, NPO status , Patient's Chart, lab work & pertinent test results  Airway Mallampati: I  TM Distance: >3 FB Neck ROM: Full    Dental  (+) Dental Advisory Given, Missing,    Pulmonary former smoker   Pulmonary exam normal breath sounds clear to auscultation       Cardiovascular hypertension, +CHF (LVEF 50%, grade 2 diastolic dysfunction)  Normal cardiovascular exam+ dysrhythmias Atrial Fibrillation + pacemaker (CHB s/p PPM 2023)  Rhythm:Irregular Rate:Normal  Echo 2024 1. Left ventricular ejection fraction, by estimation, is 50%. The left  ventricle has mildly decreased function. The left ventricle demonstrates  global hypokinesis with septal-lateral dyssynchrony consistent with LBBB.  There is mild concentric left  ventricular hypertrophy. Left ventricular diastolic parameters are  consistent with Grade II diastolic dysfunction (pseudonormalization).   2. Right ventricular systolic function is mildly reduced. The right  ventricular size is mildly enlarged. There is normal pulmonary artery  systolic pressure. The estimated right ventricular systolic pressure is  25.1 mmHg.   3. Left atrial size was moderately dilated.   4. The mitral valve is degenerative. No evidence of mitral valve  regurgitation. No evidence of mitral stenosis. The mean mitral valve  gradient is 2.5 mmHg. Moderate mitral annular calcification.   5. The aortic valve is tricuspid. There is mild calcification of the  aortic valve. Aortic valve regurgitation is not visualized. No aortic  stenosis is present.   6. The inferior vena cava is normal in size with greater than 50%  respiratory variability, suggesting right atrial pressure of 3 mmHg.   7. A small pericardial effusion is present.     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)     Musculoskeletal negative musculoskeletal ROS (+)    Abdominal   Peds  Hematology negative hematology ROS (+)   Anesthesia Other Findings   Reproductive/Obstetrics                              Anesthesia Physical Anesthesia Plan  ASA: 3  Anesthesia Plan: General   Post-op Pain Management: Minimal or no pain anticipated   Induction: Intravenous  PONV Risk Score and Plan: 3 and Ondansetron , Dexamethasone  and Treatment may vary due to age or medical condition  Airway Management Planned: Oral ETT  Additional Equipment: None  Intra-op Plan:   Post-operative Plan: Extubation in OR  Informed Consent:   Plan Discussed with:   Anesthesia Plan Comments:          Anesthesia Quick Evaluation

## 2024-05-28 NOTE — Interval H&P Note (Signed)
 History and Physical Interval Note:  05/28/2024 10:09 AM  Lisa [name]  has presented today for surgery, with the diagnosis of afib.  The various methods of treatment have been discussed with the patient and family. After consideration of risks, benefits and other options for treatment, the patient has consented to  Procedure(s): ATRIAL FIBRILLATION ABLATION (N/A) as a surgical intervention.  The patient's history has been reviewed, patient examined, no change in status, stable for surgery.  I have reviewed the patient's chart and labs.  Questions were answered to the patient's satisfaction.     Glena Pharris E Achol Azpeitia

## 2024-05-28 NOTE — Anesthesia Procedure Notes (Signed)
 Procedure Name: Intubation Date/Time: 05/28/2024 10:32 AM  Performed by: Mannie Krystal LABOR, CRNAPre-anesthesia Checklist: Patient identified, Emergency Drugs available, Suction available and Patient being monitored Patient Re-evaluated:Patient Re-evaluated prior to induction Oxygen Delivery Method: Circle system utilized Preoxygenation: Pre-oxygenation with 100% oxygen Induction Type: IV induction Ventilation: Oral airway inserted - appropriate to patient size and Two handed mask ventilation required Laryngoscope Size: Mac and 3 Grade View: Grade III Tube type: Oral Tube size: 7.0 mm Number of attempts: 1 Airway Equipment and Method: Stylet and Oral airway Placement Confirmation: ETT inserted through vocal cords under direct vision, positive ETCO2 and breath sounds checked- equal and bilateral Secured at: 21 cm Tube secured with: Tape Dental Injury: Teeth and Oropharynx as per pre-operative assessment  Comments: Patient intubated by NORVA Speed under MD/DO and CRNA supervision.

## 2024-05-28 NOTE — Discharge Instructions (Signed)

## 2024-05-29 ENCOUNTER — Telehealth (HOSPITAL_COMMUNITY): Payer: Self-pay

## 2024-05-29 MED FILL — Fentanyl Citrate Preservative Free (PF) Inj 100 MCG/2ML: INTRAMUSCULAR | Qty: 2 | Status: AC

## 2024-05-29 MED FILL — Cefazolin Sodium-Dextrose IV Solution 2 GM/100ML-4%: INTRAVENOUS | Qty: 100 | Status: AC

## 2024-05-29 NOTE — Telephone Encounter (Signed)
 Spoke with patient to complete post procedure follow up call.  Patient reports no complications with groin sites.   Instructions reviewed with patient:  Remove large bandage at puncture site after 24 hours. It is normal to have bruising, tenderness, mild swelling, and a pea or marble sized lump/knot at the groin site which can take up to three months to resolve.  Get help right away if you notice sudden swelling at the puncture site.  Check your puncture site every day for signs of infection: fever, redness, swelling, pus drainage, warmth, foul odor or excessive pain. If this occurs, please call [phone], to speak with the RN Navigator. Get help right away if your puncture site is bleeding and the bleeding does not stop after applying firm pressure to the area.  You may continue to have skipped beats/ atrial fibrillation during the first several months after your procedure.  It is very important not to miss any doses of your blood thinner Eliquis .    You will follow up with the Afib clinic on 06/25/24 and follow up with Dr.Augustus Mealor on 08/31/24.     Patient verbalized understanding to all instructions provided.

## 2024-05-31 ENCOUNTER — Encounter (HOSPITAL_COMMUNITY): Payer: Self-pay | Admitting: Cardiovascular Disease

## 2024-06-01 ENCOUNTER — Encounter: Payer: Self-pay | Admitting: Emergency Medicine

## 2024-06-01 NOTE — Telephone Encounter (Signed)
 Copied from CRM [id number]. Topic: Referral - Status >> Jun 01, 2024  2:50 PM Dedra B wrote: Reason for CRM: Pt calling to follow up on the status of her referral to urology.

## 2024-06-02 ENCOUNTER — Ambulatory Visit: Payer: Medicare Other

## 2024-06-02 DIAGNOSIS — M79671 Pain in right foot: Secondary | ICD-10-CM | POA: Diagnosis not present

## 2024-06-02 DIAGNOSIS — I442 Atrioventricular block, complete: Secondary | ICD-10-CM

## 2024-06-02 DIAGNOSIS — S92354A Nondisplaced fracture of fifth metatarsal bone, right foot, initial encounter for closed fracture: Secondary | ICD-10-CM | POA: Diagnosis not present

## 2024-06-02 DIAGNOSIS — S92351A Displaced fracture of fifth metatarsal bone, right foot, initial encounter for closed fracture: Secondary | ICD-10-CM | POA: Diagnosis not present

## 2024-06-02 DIAGNOSIS — M19071 Primary osteoarthritis, right ankle and foot: Secondary | ICD-10-CM | POA: Diagnosis not present

## 2024-06-02 DIAGNOSIS — M7731 Calcaneal spur, right foot: Secondary | ICD-10-CM | POA: Diagnosis not present

## 2024-06-02 DIAGNOSIS — X58XXXA Exposure to other specified factors, initial encounter: Secondary | ICD-10-CM | POA: Diagnosis not present

## 2024-06-02 NOTE — Telephone Encounter (Signed)
 Copied from CRM [id number]. Topic: Referral - Status >> Jun 02, 2024  3:26 PM Lisa [name] wrote: Reason for CRM: Patient would like a status update about her nephrology and urology referrals. Relayed to patient that both of these referrals are still pending, but she would appreciate more clarification. Callback number [phone].

## 2024-06-02 NOTE — Telephone Encounter (Signed)
 Spoke with patient and informed her of referral status

## 2024-06-02 NOTE — Telephone Encounter (Signed)
 LVM for patient to call back regarding referrals. Orders are still in PEND status will send referral team a message on this

## 2024-06-03 ENCOUNTER — Encounter: Payer: Self-pay | Admitting: Cardiology

## 2024-06-03 ENCOUNTER — Telehealth: Payer: Self-pay | Admitting: Radiology

## 2024-06-03 LAB — CUP PACEART REMOTE DEVICE CHECK
Date Time Interrogation Session: 20250909101506
Implantable Lead Connection Status: 753985
Implantable Lead Connection Status: 753985
Implantable Lead Implant Date: 20230313
Implantable Lead Implant Date: 20230313
Implantable Lead Location: 753859
Implantable Lead Location: 753860
Implantable Lead Model: 377171
Implantable Lead Model: 377171
Implantable Lead Serial Number: 7000407809
Implantable Lead Serial Number: 8000780983
Implantable Pulse Generator Implant Date: 20230313
Pulse Gen Model: 407145
Pulse Gen Serial Number: 70364045

## 2024-06-03 NOTE — Telephone Encounter (Signed)
 Copied from CRM [id number]. Topic: General - Other >> Jun 03, 2024 12:46 PM Henretta I wrote: Reason for CRM: Patient is needing a name of urologist that Dr. Purcell recommends and not referral. Please give patient a call with the recommended urologists.

## 2024-06-03 NOTE — Telephone Encounter (Signed)
 Reached out to referral team for assistance on referrals placed 05/21/24

## 2024-06-04 DIAGNOSIS — S92354D Nondisplaced fracture of fifth metatarsal bone, right foot, subsequent encounter for fracture with routine healing: Secondary | ICD-10-CM | POA: Diagnosis not present

## 2024-06-04 DIAGNOSIS — S92351A Displaced fracture of fifth metatarsal bone, right foot, initial encounter for closed fracture: Secondary | ICD-10-CM | POA: Diagnosis not present

## 2024-06-07 NOTE — Progress Notes (Signed)
 Chief Complaint: Vaginal bulge  History of Present Illness:  Lisa [name] is a 83 y.o. female who is seen in consultation from Purcell Emil Schanz, MD for evaluation of pelvic prolapse.  3 to 4 weeks ago she noted a bulge in her vaginal area.  This has been persistent since that time.  She has not developed problems defecating or urinating.  This is not painful but bothersome.  She had prior evaluation years ago for microscopic hematuria, with at least a cystoscopy.  She notes no gross hematuria.  History is not significant for high risk behavior for urothelial malignancies.   Past Medical History:  Past Medical History:  Diagnosis Date   Bundle branch block, left    Dyslipidemia    GERD (gastroesophageal reflux disease)    Hypercholesteremia    Hypertension    LBBB (left bundle branch block)    Pacemaker    Paroxysmal atrial fibrillation (HCC)    UTI (urinary tract infection)     Past Surgical History:  Past Surgical History:  Procedure Laterality Date   ATRIAL FIBRILLATION ABLATION N/A 05/28/2024   Procedure: ATRIAL FIBRILLATION ABLATION;  Surgeon: Nancey Eulas BRAVO, MD;  Location: [facility];  Service: Cardiovascular;  Laterality: N/A;   bladder polyp removal     BREAST EXCISIONAL BIOPSY Right    60 yrs ago- Benign   BREAST LUMPECTOMY Left    No Visible Scar, said 30 + years ago in situ, no chemo, no radiation, or hormone replacement     CARDIOVERSION N/A 07/30/2018   Procedure: CARDIOVERSION;  Surgeon: Jeffrie Oneil BROCKS, MD;  Location: [facility] ENDOSCOPY;  Service: Cardiovascular;  Laterality: N/A;   CARDIOVERSION N/A 11/28/2021   Procedure: CARDIOVERSION;  Surgeon: Waddell Danelle ORN, MD;  Location: [facility] CV [facility];  Service: Cardiovascular;  Laterality: N/A;   CARDIOVERSION N/A 03/13/2024   Procedure: CARDIOVERSION;  Surgeon: Pietro Redell RAMAN, MD;  Location: [facility] CV [facility];  Service: Cardiovascular;  Laterality: N/A;   CESAREAN SECTION     PACEMAKER IMPLANT  N/A 12/04/2021   Procedure: PACEMAKER IMPLANT;  Surgeon: Waddell Danelle ORN, MD;  Location: [facility];  Service: Cardiovascular;  Laterality: N/A;    Allergies:  Allergies  Allergen Reactions   Codeine Nausea And Vomiting and Other (See Comments)    GI upset    Fosamax [Alendronate Sodium] Other (See Comments)    Tooth problems   Lisinopril Cough   Olmesartan Other (See Comments)    Possible photodermatitis    Amlodipine Swelling    edema     Family History:  Family History  Problem Relation Age of Onset   Breast cancer Mother    Hip fracture Mother    Ulcers Father    CVA Brother    Renal cancer Brother    Other Brother        hip replacement   CVA Brother        Had PaceMaker   Crohn's disease Brother    Diabetes Brother     Social History:  Social History   Tobacco Use   Smoking status: Former    Current packs/day: 0.00    Types: Cigarettes    Start date: 1973    Quit date: 1993    Years since quitting: 32.7   Smokeless tobacco: Never   Tobacco comments:    Former smoker 04/17/24  Vaping Use   Vaping status: Never Used  Substance Use Topics   Alcohol use: Yes  Drug use: No    Review of symptoms:  Constitutional:  Negative for unexplained weight loss, night sweats, fever, chills ENT:  Negative for nose bleeds, sinus pain, painful swallowing CV:  Negative for chest pain, shortness of breath, exercise intolerance, palpitations, loss of consciousness Resp:  Negative for cough, wheezing, shortness of breath GI:  Negative for nausea, vomiting, diarrhea, bloody stools GU:  Positives noted in HPI; otherwise negative for gross hematuria, dysuria, urinary incontinence Neuro:  Negative for seizures, poor balance, limb weakness, slurred speech Psych:  Negative for lack of energy, depression, anxiety Endocrine:  Negative for polydipsia, polyuria, symptoms of hypoglycemia (dizziness, hunger, sweating) Hematologic:  Negative for anemia, purpura, petechia,  prolonged or excessive bleeding, use of anticoagulants  Allergic:  Negative for difficulty breathing or choking as a result of exposure to anything; no shellfish allergy; no allergic response (rash/itch) to materials, foods  Physical exam: There were no vitals taken for this visit. GENERAL APPEARANCE:  Well appearing, well developed, well nourished, NAD HEENT: Atraumatic, Normocephalic. NECK: Normal appearance LUNGS: Normal inspiratory and expiratory excursion HEART: Regular Rate ABDOMEN: Flat, soft. GU: Urethral caruncle.  Grade 3 cystocele.  Minimal rectocele.  Normal vaginal mucosa. EXTREMITIES: Moves all extremities well.  Without clubbing, cyanosis, or edema. NEUROLOGIC:  Alert and oriented x 3, normal gait, CN II-XII grossly intact.  MENTAL STATUS:  Appropriate. SKIN:  Warm, dry and intact.    Results   I have reviewed referring/prior physicians records  I have reviewed urinalysis data-microscopic hematuria (longstanding)  I reviewed prior imaging studies--CT abdomen and pelvis from May of this year.  Probable left parapelvic cyst.  Assessment: Cystocele, significant/symptomatic--patient would like this treated   Plan: - Reassured patient that she does not have a serious process, but at this point I think with her current state of good health this is worth management  -I will set up an appointment for her to see Dr. Marilynne with [facility] urogynecology for management

## 2024-06-08 ENCOUNTER — Ambulatory Visit: Admitting: Urology

## 2024-06-08 ENCOUNTER — Encounter: Payer: Self-pay | Admitting: Urology

## 2024-06-08 VITALS — BP 161/89 | HR 81 | Ht 65.0 in | Wt 150.0 lb

## 2024-06-08 DIAGNOSIS — N8110 Cystocele, unspecified: Secondary | ICD-10-CM | POA: Diagnosis not present

## 2024-06-08 LAB — MICROSCOPIC EXAMINATION

## 2024-06-08 LAB — URINALYSIS, ROUTINE W REFLEX MICROSCOPIC
Bilirubin, UA: NEGATIVE
Glucose, UA: NEGATIVE
Ketones, UA: NEGATIVE
Nitrite, UA: NEGATIVE
Specific Gravity, UA: 1.02 (ref 1.005–1.030)
Urobilinogen, Ur: 0.2 mg/dL (ref 0.2–1.0)
pH, UA: 6 (ref 5.0–7.5)

## 2024-06-08 LAB — BLADDER SCAN AMB NON-IMAGING: Scan Result: 110

## 2024-06-08 NOTE — Telephone Encounter (Signed)
 Copied from CRM [id number]. Topic: Referral - Request for Referral >> Jun 08, 2024  1:48 PM Alfonso ORN wrote: Did the patient discuss referral with their provider in the last year? Yes (If No - schedule appointment) (If Yes - send message)  Appointment offered? No  Type of order/referral and detailed reason for visit: regarding need kidney specialist    Nephrologist   Preference of office, provider, location: need someone in [place] system only  (Per patient that her insurance does not required a referral If referral order, have you been seen by this specialty before? No (If Yes, this issue or another issue? When? Where?  Can we respond through MyChart? Yes   ----------------------------------------------------------------------- From previous Reason for Contact - Referral Question: Reason for CRM:

## 2024-06-09 ENCOUNTER — Ambulatory Visit: Payer: Self-pay | Admitting: Internal Medicine

## 2024-06-09 ENCOUNTER — Encounter: Payer: Self-pay | Admitting: Urology

## 2024-06-09 NOTE — Telephone Encounter (Signed)
 Spoke with patient, informed her she should be able to scheduled now that the referral went through. Provided her with clinic information

## 2024-06-11 NOTE — Progress Notes (Signed)
 Remote PPM Transmission

## 2024-06-16 ENCOUNTER — Other Ambulatory Visit: Payer: Self-pay | Admitting: Cardiology

## 2024-06-16 NOTE — Telephone Encounter (Signed)
 Pt last saw Dr Nancey 05/12/24, last labs 05/20/24 Creat 1.40, age 83, weight 68kg, based on specified criteria pt is on appropriate dosage of Eliquis  5mg  BID for afib.  Will refill rx.

## 2024-06-18 ENCOUNTER — Telehealth: Payer: Self-pay | Admitting: Cardiology

## 2024-06-18 DIAGNOSIS — S92354D Nondisplaced fracture of fifth metatarsal bone, right foot, subsequent encounter for fracture with routine healing: Secondary | ICD-10-CM | POA: Diagnosis not present

## 2024-06-18 MED ORDER — DILTIAZEM HCL ER COATED BEADS 120 MG PO CP24: 120.0000 mg | ORAL_CAPSULE | Freq: Every day | ORAL | 2 refills | Status: DC

## 2024-06-18 MED ORDER — METOPROLOL TARTRATE 50 MG PO TABS: 50.0000 mg | ORAL_TABLET | Freq: Two times a day (BID) | ORAL | 2 refills | Status: DC

## 2024-06-18 MED ORDER — HYDROCHLOROTHIAZIDE 12.5 MG PO CAPS: 12.5000 mg | ORAL_CAPSULE | Freq: Every day | ORAL | 2 refills | Status: DC

## 2024-06-18 NOTE — Telephone Encounter (Signed)
 Pt's medications were sent to pt's pharmacy as requested. Confirmation received.

## 2024-06-18 NOTE — Telephone Encounter (Signed)
*  STAT* If patient is at the pharmacy, call can be transferred to refill team.   1. Which medications need to be refilled? (please list name of each medication and dose if known) diltiazem  (CARDIZEM  CD) 120 MG 24 hr capsule   hydrochlorothiazide  (MICROZIDE ) 12.5 MG capsule    metoprolol  tartrate (LOPRESSOR ) 50 MG tablet    2. Which pharmacy/location (including street and city if local pharmacy) is medication to be sent to?  [facility] - [place], [place] - [address]    3. Do they need a 30 day or 90 day supply? 90

## 2024-06-23 ENCOUNTER — Other Ambulatory Visit (HOSPITAL_COMMUNITY)

## 2024-06-25 ENCOUNTER — Ambulatory Visit (HOSPITAL_COMMUNITY)
Admission: RE | Admit: 2024-06-25 | Discharge: 2024-06-25 | Disposition: A | Discharge status: Home / Self Care | Source: Ambulatory Visit | Attending: Physician Assistant | Admitting: Physician Assistant

## 2024-06-25 VITALS — BP 144/78 | HR 78 | Ht 65.0 in | Wt 152.4 lb

## 2024-06-25 DIAGNOSIS — I4891 Unspecified atrial fibrillation: Secondary | ICD-10-CM | POA: Diagnosis not present

## 2024-06-25 DIAGNOSIS — I4819 Other persistent atrial fibrillation: Secondary | ICD-10-CM | POA: Diagnosis not present

## 2024-06-25 DIAGNOSIS — D6869 Other thrombophilia: Secondary | ICD-10-CM | POA: Diagnosis not present

## 2024-06-25 NOTE — Progress Notes (Signed)
 Primary Care Physician: Purcell Emil Schanz, MD Primary Cardiologist: Peter Swaziland, MD Electrophysiologist: Danelle Birmingham, MD  Referring Physician: Dr Birmingham [name] is a 83 y.o. female with a history of HTN, HLD, CHB s/p PPM, atrial fibrillation who presents for follow up in the [facility].  The patient recently had a DCCV on 03/13/24 but had quick return of afib. She has symptoms of fatigue and SOB. Patient is on Eliquis  for stroke prevention. She was started on amiodarone  as a bridge to ablation. She is s/p afib ablation with Dr Nancey on 05/28/24.  Patient returns for follow up for atrial fibrillation. She reports that she has done well since the ablation. She remains in SR today. She denies chest pain or groin issues. No bleeding issues on anticoagulation.   Today, she  denies symptoms of palpitations, chest pain, shortness of breath, orthopnea, PND, lower extremity edema, dizziness, presyncope, syncope, snoring, daytime somnolence, bleeding, or neurologic sequela. The patient is tolerating medications without difficulties and is otherwise without complaint today.    Atrial Fibrillation Risk Factors:  she does not have symptoms or diagnosis of sleep apnea. she does not have a history of rheumatic fever. she does not have a history of alcohol use. The patient does not have a history of early familial atrial fibrillation or other arrhythmias.  Atrial Fibrillation Management history:  Previous antiarrhythmic drugs: amiodarone , Multaq  Previous cardioversions: 07/30/18, 11/28/21, 03/13/24 Previous ablations: 05/28/24 Anticoagulation history: Eliquis   ROS- All systems are reviewed and negative except as per the HPI above.  Past Medical History:  Diagnosis Date   Bundle branch block, left    Dyslipidemia    GERD (gastroesophageal reflux disease)    Hypercholesteremia    Hypertension    LBBB (left bundle branch block)    Pacemaker    Paroxysmal  atrial fibrillation (HCC)    UTI (urinary tract infection)     Current Outpatient Medications  Medication Sig Dispense Refill   ALPRAZolam  (XANAX ) 0.5 MG tablet Take 1 tablet (0.5 mg total) by mouth 2 (two) times daily as needed for anxiety. 30 tablet 2   Cholecalciferol (VITAMIN D3) 50 MCG (2000 UT) TABS Take 2,000 Units by mouth daily.     diltiazem  (CARDIZEM  CD) 120 MG 24 hr capsule Take 1 capsule (120 mg total) by mouth daily. 90 capsule 2   ELIQUIS  5 MG TABS tablet TAKE 1 TABLET BY MOUTH TWICE DAILY 180 tablet 1   hydrochlorothiazide  (MICROZIDE ) 12.5 MG capsule Take 1 capsule (12.5 mg total) by mouth daily. 90 capsule 2   losartan  (COZAAR ) 100 MG tablet TAKE 1 TABLET BY MOUTH DAILY GENERIC EQUIVALENT FOR COZAAR  90 tablet 2   metoprolol  tartrate (LOPRESSOR ) 50 MG tablet Take 1 tablet (50 mg total) by mouth 2 (two) times daily. 180 tablet 2   Olopatadine HCl (PATADAY OP) Place 1 drop into both eyes in the morning.     ondansetron  (ZOFRAN ) 4 MG tablet Take 1 tablet (4 mg total) by mouth every 8 (eight) hours as needed for nausea or vomiting. 20 tablet 1   pantoprazole  (PROTONIX ) 40 MG tablet TAKE 1 TABLET BY MOUTH ONCE DAILY IN THE MORNING -  IF  SYMPTOMS  RETURN  GO  BACK  TO  TWICE  DAILY 60 tablet 3   No current facility-administered medications for this encounter.    Physical Exam: BP (!) 144/78   Pulse 78   Ht 5' 5 (1.651 m)   Wt 69.1  kg   BMI 25.36 kg/m   GEN: Well nourished, well developed in no acute distress CARDIAC: Regular rate and rhythm, no murmurs, rubs, gallops RESPIRATORY:  Clear to auscultation without rales, wheezing or rhonchi  ABDOMEN: Soft, non-tender, non-distended EXTREMITIES:  No edema; No deformity    Wt Readings from Last 3 Encounters:  06/25/24 69.1 kg  06/08/24 68 kg  05/28/24 68 kg     EKG today demonstrates  AV dual paced rhythm Vent. rate 78 BPM PR interval 244 ms QRS duration 110 ms QT/QTcB 406/462 ms   Echo 09/09/23 demonstrated    1. Left ventricular ejection fraction, by estimation, is 50%. The left  ventricle has mildly decreased function. The left ventricle demonstrates  global hypokinesis with septal-lateral dyssynchrony consistent with LBBB.  There is mild concentric left ventricular hypertrophy. Left ventricular diastolic parameters are consistent with Grade II diastolic dysfunction (pseudonormalization).   2. Right ventricular systolic function is mildly reduced. The right  ventricular size is mildly enlarged. There is normal pulmonary artery  systolic pressure. The estimated right ventricular systolic pressure is  25.1 mmHg.   3. Left atrial size was moderately dilated.   4. The mitral valve is degenerative. No evidence of mitral valve  regurgitation. No evidence of mitral stenosis. The mean mitral valve  gradient is 2.5 mmHg. Moderate mitral annular calcification.   5. The aortic valve is tricuspid. There is mild calcification of the  aortic valve. Aortic valve regurgitation is not visualized. No aortic  stenosis is present.   6. The inferior vena cava is normal in size with greater than 50%  respiratory variability, suggesting right atrial pressure of 3 mmHg.   7. A small pericardial effusion is present.    CHA2DS2-VASc Score = 4  The patient's score is based upon: CHF History: 0 HTN History: 1 Diabetes History: 0 Stroke History: 0 Vascular Disease History: 0 Age Score: 2 Gender Score: 1       ASSESSMENT AND PLAN: Persistent Atrial Fibrillation (ICD10:  I48.19) The patient's CHA2DS2-VASc score is 4, indicating a 4.8% annual risk of stroke.   S/p afib ablation 05/28/24, now off amiodarone .  Patient appears to be maintaining SR Continue diltiazem  120 mg daily Continue Lopressor  50 mg BID Continue Eliquis  5 mg BID with no missed doses for 3 months post ablation.   Secondary Hypercoagulable State (ICD10:  D68.69) The patient is at significant risk for stroke/thromboembolism based upon her  CHA2DS2-VASc Score of 4.  Continue Apixaban  (Eliquis ). No bleeding issues.   HTN Stable on current regimen  CHB S/p PPM, followed by Dr Waddell   Follow up with Dr Nancey as scheduled.    [facility] [address] [place], [place] [address] [phone]

## 2024-07-05 ENCOUNTER — Other Ambulatory Visit: Payer: Self-pay | Admitting: Gastroenterology

## 2024-07-13 ENCOUNTER — Telehealth: Payer: Self-pay | Admitting: Cardiology

## 2024-07-13 DIAGNOSIS — K080 Exfoliation of teeth due to systemic causes: Secondary | ICD-10-CM | POA: Diagnosis not present

## 2024-07-13 NOTE — Telephone Encounter (Signed)
   Pre-operative Risk Assessment    Patient Name: Lisa [name]  DOB: 07-Jan-1941 MRN: [id number]   Date of last office visit: 06/25/24 Date of next office visit: 08/31/24  Request for Surgical Clearance    Procedure:  Cleaning  Date of Surgery:  Clearance 07/13/24                                Surgeon:  Dr. Ezzie Seat Surgeon's Group or Practice Name:  [facility]  Phone number:  [phone]  Fax number:  [phone]   Type of Clearance Requested:   - Medical    Type of Anesthesia:  None    Additional requests/questions:  Caller Kayleen) stated patient is in the dental chair and had a ablation about a month ago and wants to know if patient can have routine cleaning.  Signed, Jasmin B Wilson   07/13/2024, 10:54 AM

## 2024-07-13 NOTE — Telephone Encounter (Signed)
    Primary Cardiologist: Peter Swaziland, MD  Chart reviewed as part of pre-operative protocol coverage. Simple dental extractions and cleanings are considered low risk procedures per guidelines and generally do not require any specific cardiac clearance. It is also generally accepted that for simple extractions and dental cleanings, there is no need to interrupt blood thinner therapy.  Given recent ablation patient cannot interrupt blood thinner therapy.  SBE prophylaxis is not required for the patient.   I will route this recommendation to the requesting party via Epic fax function and remove from pre-op pool.  Please call with questions.  Zettie Gootee D Carston Riedl, NP 07/13/2024, 11:06 AM

## 2024-07-13 NOTE — Telephone Encounter (Signed)
 DDS office called with the pt in the chair. I did confirm procedure is regular dental cleaning. No need to hold blood thinner per DDS.   I did state preop APP will need to review for clearance sign off.

## 2024-07-13 NOTE — Telephone Encounter (Signed)
 I s/w Summer at the DDS and reviewed notes pt is ok to proceed with procedure for dental cleaning.    I will fax notes to DDS office

## 2024-07-16 DIAGNOSIS — S92354D Nondisplaced fracture of fifth metatarsal bone, right foot, subsequent encounter for fracture with routine healing: Secondary | ICD-10-CM | POA: Diagnosis not present

## 2024-07-20 DIAGNOSIS — Z85828 Personal history of other malignant neoplasm of skin: Secondary | ICD-10-CM | POA: Diagnosis not present

## 2024-07-20 DIAGNOSIS — L814 Other melanin hyperpigmentation: Secondary | ICD-10-CM | POA: Diagnosis not present

## 2024-07-20 DIAGNOSIS — D485 Neoplasm of uncertain behavior of skin: Secondary | ICD-10-CM | POA: Diagnosis not present

## 2024-07-20 DIAGNOSIS — D2261 Melanocytic nevi of right upper limb, including shoulder: Secondary | ICD-10-CM | POA: Diagnosis not present

## 2024-07-20 DIAGNOSIS — L578 Other skin changes due to chronic exposure to nonionizing radiation: Secondary | ICD-10-CM | POA: Diagnosis not present

## 2024-07-20 DIAGNOSIS — L821 Other seborrheic keratosis: Secondary | ICD-10-CM | POA: Diagnosis not present

## 2024-07-20 DIAGNOSIS — B079 Viral wart, unspecified: Secondary | ICD-10-CM | POA: Diagnosis not present

## 2024-07-20 DIAGNOSIS — L820 Inflamed seborrheic keratosis: Secondary | ICD-10-CM | POA: Diagnosis not present

## 2024-07-23 DIAGNOSIS — I129 Hypertensive chronic kidney disease with stage 1 through stage 4 chronic kidney disease, or unspecified chronic kidney disease: Secondary | ICD-10-CM | POA: Diagnosis not present

## 2024-07-23 DIAGNOSIS — D638 Anemia in other chronic diseases classified elsewhere: Secondary | ICD-10-CM | POA: Diagnosis not present

## 2024-07-23 DIAGNOSIS — E559 Vitamin D deficiency, unspecified: Secondary | ICD-10-CM | POA: Diagnosis not present

## 2024-07-23 DIAGNOSIS — N1832 Chronic kidney disease, stage 3b: Secondary | ICD-10-CM | POA: Diagnosis not present

## 2024-07-24 ENCOUNTER — Ambulatory Visit
Admission: RE | Admit: 2024-07-24 | Discharge: 2024-07-24 | Disposition: A | Discharge status: Home / Self Care | Source: Ambulatory Visit | Attending: Nephrology | Admitting: Nephrology

## 2024-07-24 ENCOUNTER — Other Ambulatory Visit: Payer: Self-pay | Admitting: Nephrology

## 2024-07-24 DIAGNOSIS — N281 Cyst of kidney, acquired: Secondary | ICD-10-CM | POA: Diagnosis not present

## 2024-07-24 DIAGNOSIS — N1832 Chronic kidney disease, stage 3b: Secondary | ICD-10-CM

## 2024-07-27 DIAGNOSIS — K080 Exfoliation of teeth due to systemic causes: Secondary | ICD-10-CM | POA: Diagnosis not present

## 2024-07-30 ENCOUNTER — Other Ambulatory Visit: Payer: Self-pay | Admitting: Emergency Medicine

## 2024-07-30 DIAGNOSIS — Z1231 Encounter for screening mammogram for malignant neoplasm of breast: Secondary | ICD-10-CM

## 2024-08-23 ENCOUNTER — Emergency Department (HOSPITAL_COMMUNITY): Admission: EM | Admit: 2024-08-23 | Discharge: 2024-08-23 | Disposition: A | Discharge status: Home / Self Care

## 2024-08-23 ENCOUNTER — Other Ambulatory Visit: Payer: Self-pay

## 2024-08-23 ENCOUNTER — Emergency Department (HOSPITAL_COMMUNITY)

## 2024-08-23 DIAGNOSIS — R001 Bradycardia, unspecified: Secondary | ICD-10-CM | POA: Diagnosis not present

## 2024-08-23 DIAGNOSIS — R42 Dizziness and giddiness: Secondary | ICD-10-CM | POA: Diagnosis not present

## 2024-08-23 DIAGNOSIS — R079 Chest pain, unspecified: Secondary | ICD-10-CM

## 2024-08-23 DIAGNOSIS — Z7901 Long term (current) use of anticoagulants: Secondary | ICD-10-CM | POA: Diagnosis not present

## 2024-08-23 DIAGNOSIS — R110 Nausea: Secondary | ICD-10-CM | POA: Diagnosis not present

## 2024-08-23 DIAGNOSIS — R0789 Other chest pain: Secondary | ICD-10-CM | POA: Diagnosis not present

## 2024-08-23 LAB — BASIC METABOLIC PANEL WITH GFR
Anion gap: 9 (ref 5–15)
BUN: 17 mg/dL (ref 8–23)
CO2: 25 mmol/L (ref 22–32)
Calcium: 9.6 mg/dL (ref 8.9–10.3)
Chloride: 103 mmol/L (ref 98–111)
Creatinine, Ser: 1.09 mg/dL — ABNORMAL HIGH (ref 0.44–1.00)
GFR, Estimated: 51 mL/min — ABNORMAL LOW (ref >60)
Glucose, Bld: 155 mg/dL — ABNORMAL HIGH (ref 70–99)
Potassium: 3.9 mmol/L (ref 3.5–5.1)
Sodium: 137 mmol/L (ref 135–145)

## 2024-08-23 LAB — CBC WITH DIFFERENTIAL/PLATELET
Abs Immature Granulocytes: 0.03 K/uL (ref 0.00–0.07)
Basophils Absolute: 0.1 K/uL (ref 0.0–0.1)
Basophils Relative: 1 %
Eosinophils Absolute: 0.2 K/uL (ref 0.0–0.5)
Eosinophils Relative: 2 %
HCT: 36.6 % (ref 36.0–46.0)
Hemoglobin: 11.8 g/dL — ABNORMAL LOW (ref 12.0–15.0)
Immature Granulocytes: 0 %
Lymphocytes Relative: 17 %
Lymphs Abs: 1.3 K/uL (ref 0.7–4.0)
MCH: 26.7 pg (ref 26.0–34.0)
MCHC: 32.2 g/dL (ref 30.0–36.0)
MCV: 82.8 fL (ref 80.0–100.0)
Monocytes Absolute: 0.3 K/uL (ref 0.1–1.0)
Monocytes Relative: 4 %
Neutro Abs: 5.7 K/uL (ref 1.7–7.7)
Neutrophils Relative %: 76 %
Platelets: 295 K/uL (ref 150–400)
RBC: 4.42 MIL/uL (ref 3.87–5.11)
RDW: 14.1 % (ref 11.5–15.5)
WBC: 7.5 K/uL (ref 4.0–10.5)
nRBC: 0 % (ref 0.0–0.2)

## 2024-08-23 LAB — TROPONIN I (HIGH SENSITIVITY)
Troponin I (High Sensitivity): 8 ng/L (ref <18)
Troponin I (High Sensitivity): 9 ng/L (ref <18)

## 2024-08-23 NOTE — ED Notes (Signed)
 CCMD called.

## 2024-08-23 NOTE — ED Triage Notes (Addendum)
 Pt. BIB [facility] from home with c/o chest discomfort; pt. States that she was making coffee this morning when this feeling of chest pain happened, she states that she got real nauseated and felt lightheaded; Pt. Has a Hx of an ablation (Sept. 4th), A-fib, and has a Biometric pacemaker; Pt. Denies SOB or LOC. Per [facility], the pt. Stated that the chest pain had went away whenever they got there, but just wanted to come in to be safe.   VS per [facility]:  BP: 150/90 HR: 70  SpO2: 95% RA

## 2024-08-23 NOTE — ED Notes (Signed)
 EDP at [facility]

## 2024-08-23 NOTE — ED Provider Notes (Signed)
 [place] [facility] Provider Note   CSN: [id number] Arrival date & time: 08/23/24  0830     Patient presents with: Chest Pain   Lisa [name] is a 83 y.o. female.   83 year old female presents for evaluation of chest pain.  States she has been coughing she had sudden onset pressure-like chest pain got very lightheaded and nauseous and had to sit down.  She states EMS gave her aspirin and nitro and now her symptoms have completely resolved.  She recently had an ablation for A-fib and has a history of pacemaker.  She denies any other symptoms or concerns at this time.   Chest Pain Associated symptoms: no abdominal pain, no back pain, no cough, no fever, no palpitations, no shortness of breath and no vomiting        Prior to Admission medications   Medication Sig Start Date End Date Taking? Authorizing Provider  ALPRAZolam  (XANAX ) 0.5 MG tablet Take 1 tablet (0.5 mg total) by mouth 2 (two) times daily as needed for anxiety. 04/06/24   Purcell Emil Schanz, MD  Cholecalciferol (VITAMIN D3) 50 MCG (2000 UT) TABS Take 2,000 Units by mouth daily.    [provider]  diltiazem  (CARDIZEM  CD) 120 MG 24 hr capsule Take 1 capsule (120 mg total) by mouth daily. 06/18/24   Jordan, Peter M, MD  ELIQUIS  5 MG TABS tablet TAKE 1 TABLET BY MOUTH TWICE DAILY 06/16/24   Jordan, Peter M, MD  hydrochlorothiazide  (MICROZIDE ) 12.5 MG capsule Take 1 capsule (12.5 mg total) by mouth daily. 06/18/24   Jordan, Peter M, MD  losartan  (COZAAR ) 100 MG tablet TAKE 1 TABLET BY MOUTH DAILY GENERIC EQUIVALENT FOR COZAAR  06/16/24   Jordan, Peter M, MD  metoprolol  tartrate (LOPRESSOR ) 50 MG tablet Take 1 tablet (50 mg total) by mouth 2 (two) times daily. 06/18/24   Jordan, Peter M, MD  Olopatadine HCl (PATADAY OP) Place 1 drop into both eyes in the morning.    [provider]  ondansetron  (ZOFRAN ) 4 MG tablet Take 1 tablet (4 mg total) by mouth every 8 (eight) hours as  needed for nausea or vomiting. 04/06/24   Purcell Emil Schanz, MD  pantoprazole  (PROTONIX ) 40 MG tablet TAKE 1 TABLET BY MOUTH ONCE DAILY IN THE MORNING -  IF  SYMPTOMS  RETURN  GO  BACK  TO  TWICE  DAILY 07/06/24   Armbruster, Elspeth SQUIBB, MD    Allergies: Codeine, Fosamax [alendronate sodium], Lisinopril, Olmesartan, and Amlodipine    Review of Systems  Constitutional:  Negative for chills and fever.  HENT:  Negative for ear pain and sore throat.   Eyes:  Negative for pain and visual disturbance.  Respiratory:  Negative for cough and shortness of breath.   Cardiovascular:  Positive for chest pain. Negative for palpitations.  Gastrointestinal:  Negative for abdominal pain and vomiting.  Genitourinary:  Negative for dysuria and hematuria.  Musculoskeletal:  Negative for arthralgias and back pain.  Skin:  Negative for color change and rash.  Neurological:  Negative for seizures and syncope.  All other systems reviewed and are negative.   Updated Vital Signs BP (!) 166/87   Pulse 74   Temp 98 F (36.7 C) (Oral)   Resp 16   SpO2 96%   Physical Exam Vitals and nursing note reviewed.  Constitutional:      General: She is not in acute distress.    Appearance: She is well-developed.  HENT:  Head: Normocephalic and atraumatic.  Eyes:     Conjunctiva/sclera: Conjunctivae normal.  Cardiovascular:     Rate and Rhythm: Normal rate and regular rhythm.     Heart sounds: No murmur heard. Pulmonary:     Effort: Pulmonary effort is normal. No respiratory distress.     Breath sounds: Normal breath sounds.  Abdominal:     Palpations: Abdomen is soft.     Tenderness: There is no abdominal tenderness.  Musculoskeletal:        General: No swelling.     Cervical back: Neck supple.  Skin:    General: Skin is warm and dry.     Capillary Refill: Capillary refill takes less than 2 seconds.  Neurological:     Mental Status: She is alert.  Psychiatric:        Mood and Affect: Mood normal.      (all labs ordered are listed, but only abnormal results are displayed) Labs Reviewed  BASIC METABOLIC PANEL WITH GFR - Abnormal; Notable for the following components:      Result Value   Glucose, Bld 155 (*)    Creatinine, Ser 1.09 (*)    GFR, Estimated 51 (*)    All other components within normal limits  CBC WITH DIFFERENTIAL/PLATELET - Abnormal; Notable for the following components:   Hemoglobin 11.8 (*)    All other components within normal limits  TROPONIN I (HIGH SENSITIVITY)  TROPONIN I (HIGH SENSITIVITY)    EKG: EKG Interpretation Date/Time:  Sunday August 23 2024 08:35:33 EST Ventricular Rate:  81 PR Interval:  199 QRS Duration:  132 QT Interval:  403 QTC Calculation: 468 R Axis:   81  Text Interpretation: Sinus rhythm Multiple ventricular premature complexes Nonspecific intraventricular conduction delay Anterior infarct, old Compared with prior EKG from 06/25/2024 Confirmed by Gennaro Bouchard ([id number]) on 08/23/2024 8:59:21 AM  Radiology: [facility] Chest 1 View Result Date: 08/23/2024 CLINICAL DATA:  Chest pain. EXAM: CHEST  1 VIEW COMPARISON:  09/08/2023 and older exams. FINDINGS: Cardiac silhouette is normal in size. No mediastinal masses. Left anterior chest wall dual lead pacemaker is stable. Lungs are hyperexpanded with prominent bronchovascular markings, otherwise clear. No pleural effusion or pneumothorax. Skeletal structures are grossly intact. IMPRESSION: No active disease. Electronically Signed   By: Alm Parkins M.D.   On: 08/23/2024 10:04     Procedures   Medications Ordered in the ED - No data to display                                  Medical Decision Making Cardiac monitor interpretation: Sinus rhythm, no ectopy  Patient here for anginal chest pain that resolved with nitro and aspirin and was a feeling much better.  She has follow-up with cardiology in less than 2 weeks.  Will plan to stay on her medications as prescribed.  She like to go home and  I think this is reasonable.  Given strict return precautions.  Advised to return for new or worsening symptoms.  She feels comfortable being discharged.  All results and plan discussed with patient family bedside.  Problems Addressed: Nonspecific chest pain: acute illness or injury  Amount and/or Complexity of Data Reviewed External Data Reviewed: notes.    Details: Prior cardiology records reviewed and shows patient had a recent ablation for afib  Labs: ordered. Decision-making details documented in ED Course.    Details: Ordered and reviewed by me and unremarkable,  troponins negative x 2 Radiology: ordered and independent interpretation performed. Decision-making details documented in ED Course.    Details: Ordered and interpreted me independently of radiology Chest x-ray: Shows no acute abnormality ECG/medicine tests: ordered and independent interpretation performed. Decision-making details documented in ED Course.    Details: Ordered and interpreted by me in the absence of cardiology EKG shows no STEMI or acute change when compared to prior, patient does have some PVCs  Risk OTC drugs. Prescription drug management.     Final diagnoses:  Nonspecific chest pain    ED Discharge Orders     None          Gennaro Duwaine CROME, DO 08/23/24 1342

## 2024-08-23 NOTE — Discharge Instructions (Addendum)
 Follow-up with your primary care doctor and cardiologist in the next 2 weeks.  Return to the ER for new or worsening symptoms.

## 2024-08-27 ENCOUNTER — Ambulatory Visit
Admission: RE | Admit: 2024-08-27 | Discharge: 2024-08-27 | Disposition: A | Source: Ambulatory Visit | Attending: Emergency Medicine

## 2024-08-27 DIAGNOSIS — Z1231 Encounter for screening mammogram for malignant neoplasm of breast: Secondary | ICD-10-CM | POA: Diagnosis not present

## 2024-08-31 ENCOUNTER — Encounter: Payer: Self-pay | Admitting: Cardiovascular Disease

## 2024-08-31 ENCOUNTER — Ambulatory Visit: Attending: Cardiovascular Disease | Admitting: Cardiovascular Disease

## 2024-08-31 VITALS — BP 136/88 | HR 80 | Ht 65.0 in | Wt 152.0 lb

## 2024-08-31 DIAGNOSIS — I4819 Other persistent atrial fibrillation: Secondary | ICD-10-CM | POA: Diagnosis not present

## 2024-08-31 NOTE — Patient Instructions (Signed)

## 2024-08-31 NOTE — Progress Notes (Signed)
 Electrophysiology Office Note:    Date:  08/31/2024   ID:  Lisa [name], DOB [birth year], MRN [id number]  PCP:  Purcell Emil Schanz, MD   [facility] Providers Cardiologist:  Peter Jordan, MD Electrophysiologist:  Danelle Birmingham, MD     Referring MD: Purcell Emil Schanz, *   History of Present Illness:    Lisa [name] is a 83 y.o. female with a medical history significant for atrial fibrillation, complete heart block with Biotronik pacemaker, referred for management of atrial fibrillation.       Discussed the use of AI scribe software for clinical note transcription with the patient, who gave verbal consent to proceed.  History of Present Illness Lisa [name] is an 83 year old female with atrial fibrillation and heart block who presents for management of atrial fibrillation. She was referred by the atrial fibrillation clinic for management of atrial fibrillation.  She has a Biotronik pacemaker for heart block and has undergone multiple cardioversions (07/2018, 11/2021), with the most recent in June 2025. Atrial fibrillation recurred in June 2023. She started amiodarone  in July but experienced severe vomiting, leading to a reduced dosage, now on half a pill daily and still with some nausea  She experiences variable energy levels, palpitations, and ankle swelling, when in atrial fibrillation.   She consistently takes Eliquis  without missing doses. She remains active, engaging in activities like trimming shrubbery.  She underwent A-fib ablation on May 28, 2024.  She has not had any recurrence since the ablation according to her device check today.  She reports that she feels great.         Today, she is at baseline with atrial fibrillation --some fatigue, shortness of breath  EKGs/Labs/Other Studies Reviewed Today:     Echocardiogram:  TTE December 2024 LVEF 50%.  Global hypokinesis with septal dyssynchrony due to pacing.  Mild concentric LVH.  Grade 2  diastolic dysfunction.  Right ventricle is mildly enlarged.  Left atrium is mildly dilated.     EKG:   EKG Interpretation Date/Time:  Monday August 31 2024 11:52:13 EST Ventricular Rate:  80 PR Interval:  252 QRS Duration:  114 QT Interval:  388 QTC Calculation: 447 R Axis:   76  Text Interpretation: AV dual-paced rhythm with prolonged AV conduction When compared with ECG of 23-Aug-2024 08:35, PREVIOUS ECG IS PRESENT Confirmed by Nancey Scotts [phone]) on 08/31/2024 12:00:07 PM     Physical Exam:    VS:  BP 136/88   Pulse 80   Ht 5' 5 (1.651 m)   Wt 152 lb (68.9 kg)   SpO2 94%   BMI 25.29 kg/m     Wt Readings from Last 3 Encounters:  08/31/24 152 lb (68.9 kg)  06/25/24 152 lb 6.4 oz (69.1 kg)  06/08/24 150 lb (68 kg)     GEN:  Well nourished, well developed in no acute distress CARDIAC: RRR, no murmurs, rubs, gallops RESPIRATORY:  Normal work of breathing MUSCULOSKELETAL: No appreciable edema    ASSESSMENT & PLAN:     Persistent atrial fibrillation She has had 3 cardioversions over the years, most recently June 2025 She is symptomatic with fatigue, dependent edema, and palpitations Amiodarone  was started July 2025 -- not tolerated due to nausea For now, continue diltiazem  120 mg daily, Lopressor  50 mg daily Status post atrial fibrillation ablation on May 28, 2024--no recurrence of A-fib since   Secondary hypercoagulable state CHA2DS2-VASc score is 4 Continue Eliquis  5 mg twice daily  Complete heart  block Biotronik dual-chamber pacemaker functioning normally      Signed, Eulas FORBES Furbish, MD  08/31/2024 12:05 PM    [facility]

## 2024-09-01 ENCOUNTER — Ambulatory Visit: Payer: Medicare Other

## 2024-09-01 DIAGNOSIS — I4819 Other persistent atrial fibrillation: Secondary | ICD-10-CM | POA: Diagnosis not present

## 2024-09-02 LAB — CUP PACEART REMOTE DEVICE CHECK
Battery Voltage: 80
Date Time Interrogation Session: 20251209075901
Implantable Lead Connection Status: 753985
Implantable Lead Connection Status: 753985
Implantable Lead Implant Date: 20230313
Implantable Lead Implant Date: 20230313
Implantable Lead Location: 753859
Implantable Lead Location: 753860
Implantable Lead Model: 377171
Implantable Lead Model: 377171
Implantable Lead Serial Number: 7000407809
Implantable Lead Serial Number: 8000780983
Implantable Pulse Generator Implant Date: 20230313
Pulse Gen Model: 407145
Pulse Gen Serial Number: 70364045

## 2024-09-08 NOTE — Progress Notes (Signed)
 Remote PPM Transmission

## 2024-09-09 NOTE — Progress Notes (Unsigned)
 Cardiology Office Note:    Date:  09/10/2024   ID:  Lisa [name], DOB 03/21/1941, MRN [id number]  PCP:  Purcell Emil Schanz, MD [facility] Cardiologist: Annalis Kaczmarczyk, MD   Reason for visit: Follow-up Afib and HTN  History of Present Illness:    Lisa [name] is a 83 y.o. female with a hx of atrial fibrillation s/p DCCV in 07/2018 (sx SOB, swelling, indigestion), hypertension, hyperlipidemia, left bundle branch block.   Myoview  in January 2019 was normal.   Z  She was seen in ED at [facility] in Jan 2023. She had an episode of syncope at home. Following this she had acute N/V and diarrhea.  Ecg showed NSR with chronic LBBB. BP was quite high to 202/110. Labs including troponins were negative. She hasn't had any further dizziness or syncope but has persistent elevated BP.  She was on metoprolol , losartan  and HCTZ.  When seen in Feb she was in NSR. Later presented in March with recurrent Afib. She underwent successful DCCV. 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 She was admitted and underwent placement of dual chamber pacemaker by Dr Waddell. Echo was unremarkable. She was DC on metoprolol  and Eliquis . Prior renal artery duplex was negative for RAS.  I saw her on Dec 3 and she was doing well. She was admitted overnight on 12/15 for palpitations. Pacer interrogation showed Afib with RVR. She converted. Had atypical chest pain. Minimal troponin leak metoprolol  was increased to 50 mg bid and losartan  and HCT held. I saw her on 12/26 and she was doing ok. BP was high. Started on low dose hydralazine .  Seen in EP office on 12/31 and was in NSR then.   She had recurrent Afib in June and had successful DCCV on 6/20. She was seen by Dr Nancey and had ablation on Sept 4. Continued on diltiazem  and metoprolol .   On follow up today she notes after Thanksgiving she had an episode of dizziness, nausea and precordial chest  pain/heaviness. Went to ED. Ecg and troponins OK. Has felt OK since then. No afib noted. Reports at home BP 127-135/70      Past Medical History:  Diagnosis Date   Bundle branch block, left    Dyslipidemia    GERD (gastroesophageal reflux disease)    Hypercholesteremia    Hypertension    LBBB (left bundle branch block)    Pacemaker    Paroxysmal atrial fibrillation (HCC)    UTI (urinary tract infection)     Past Surgical History:  Procedure Laterality Date   ATRIAL FIBRILLATION ABLATION N/A 05/28/2024   Procedure: ATRIAL FIBRILLATION ABLATION;  Surgeon: Nancey Eulas BRAVO, MD;  Location: [facility] INVASIVE CV LAB;  Service: Cardiovascular;  Laterality: N/A;   bladder polyp removal     BREAST EXCISIONAL BIOPSY Right    60 yrs ago- Benign   BREAST LUMPECTOMY Left    No Visible Scar, said 30 + years ago in situ, no chemo, no radiation, or hormone replacement     CARDIOVERSION N/A 07/30/2018   Procedure: CARDIOVERSION;  Surgeon: Jeffrie Oneil BROCKS, MD;  Location: [facility] ENDOSCOPY;  Service: Cardiovascular;  Laterality: N/A;   CARDIOVERSION N/A 11/28/2021   Procedure: CARDIOVERSION;  Surgeon: Waddell Danelle ORN, MD;  Location: [facility] INVASIVE CV LAB;  Service: Cardiovascular;  Laterality: N/A;   CARDIOVERSION N/A 03/13/2024   Procedure: CARDIOVERSION;  Surgeon: Pietro Redell RAMAN, MD;  Location: [facility] INVASIVE  CV LAB;  Service: Cardiovascular;  Laterality: N/A;   CESAREAN SECTION     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   Procedure: PACEMAKER IMPLANT;  Surgeon: Waddell Danelle ORN, MD;  Location: [facility] INVASIVE CV LAB;  Service: Cardiovascular;  Laterality: N/A;    Current Medications: Current Meds  Medication Sig   Cholecalciferol (VITAMIN D3) 50 MCG (2000 UT) TABS Take 2,000 Units by mouth daily.   diltiazem  (CARDIZEM  CD) 120 MG 24 hr capsule Take 1 capsule (120 mg total) by mouth daily.   ELIQUIS  5 MG TABS tablet TAKE 1 TABLET BY MOUTH TWICE DAILY   hydrochlorothiazide  (MICROZIDE ) 12.5 MG capsule Take 1 capsule (12.5  mg total) by mouth daily.   losartan  (COZAAR ) 100 MG tablet TAKE 1 TABLET BY MOUTH DAILY GENERIC EQUIVALENT FOR COZAAR    metoprolol  tartrate (LOPRESSOR ) 50 MG tablet Take 1 tablet (50 mg total) by mouth 2 (two) times daily.   Olopatadine HCl (PATADAY OP) Place 1 drop into both eyes in the morning.   pantoprazole  (PROTONIX ) 40 MG tablet TAKE 1 TABLET BY MOUTH ONCE DAILY IN THE MORNING -  IF  SYMPTOMS  RETURN  GO  BACK  TO  TWICE  DAILY     Allergies:   Codeine, Fosamax [alendronate sodium], Lisinopril, Olmesartan, and Amlodipine   Social History   Socioeconomic History   Marital status: Married    Spouse name: Not on file   Number of children: 2   Years of education: Not on file   Highest education level: Associate degree: academic program  Occupational History   Not on file  Tobacco Use   Smoking status: Former    Current packs/day: 0.00    Types: Cigarettes    Start date: 1973    Quit date: 1993    Years since quitting: 32.9   Smokeless tobacco: Never   Tobacco comments:    Former smoker 04/17/24  Vaping Use   Vaping status: Never Used  Substance and Sexual Activity   Alcohol use: Yes   Drug use: No   Sexual activity: Not on file  Other Topics Concern   Not on file  Social History Narrative   Not on file   Social Drivers of Health   Tobacco Use: Medium Risk (09/10/2024)   Patient History    Smoking Tobacco Use: Former    Smokeless Tobacco Use: Never    Passive Exposure: Not on file  Financial Resource Strain: Patient Declined (04/03/2024)   Overall Financial Resource Strain (CARDIA)    Difficulty of Paying Living Expenses: Patient declined  Food Insecurity: Low Risk (07/16/2024)   Received from [facility]   Epic    Within the past 12 months, you worried that your food would run out before you got money to buy more: Never true    Within the past 12 months, the food you bought just didn't last and you didn't have money to get more. : Never true  Transportation  Needs: No Transportation Needs (07/16/2024)   Received from [facility]    In the past 12 months, has lack of reliable transportation kept you from medical appointments, meetings, work or from getting things needed for daily living? : No  Physical Activity: Unknown (04/03/2024)   Exercise Vital Sign    Days of Exercise per Week: 4 days    Minutes of Exercise per Session: Not on file  Stress: Stress Concern Present (04/03/2024)   [employer] of Occupational Health - Occupational Stress Questionnaire  Feeling of Stress: Very much  Social Connections: Moderately Isolated (04/03/2024)   Social Connection and Isolation Panel    Frequency of Communication with Friends and Family: Twice a week    Frequency of Social Gatherings with Friends and Family: Once a week    Attends Religious Services: Never    Database Administrator or Organizations: No    Attends Banker Meetings: Not on file    Marital Status: Married  Depression (PHQ2-9): Low Risk (05/21/2024)   Depression (PHQ2-9)    PHQ-2 Score: 1  Alcohol Screen: Low Risk (10/07/2023)   Alcohol Screen    Last Alcohol Screening Score (AUDIT): 1  Housing: Low Risk (07/16/2024)   Received from [facility]   Epic    Think about the place you live. Do you have problems with any of the following? Choose all that apply:: None/None on this list    What is your living situation today?: I have a steady place to live  Utilities: Low Risk (07/16/2024)   Received from [facility]   Utilities    In the past 12 months has the electric, gas, oil, or water company threatened to shut off services in your home? : No  Health Literacy: Adequate Health Literacy (04/30/2023)   B1300 Health Literacy    Frequency of need for help with medical instructions: Never     Family History: The patient's family history includes Breast cancer in her mother; CVA in her brother and brother; Crohn's disease in her brother; Diabetes in  her brother; Hip fracture in her mother; Other in her brother; Renal cancer in her brother; Ulcers in her father.  ROS:   Please see the history of present illness.     EKGs/Labs/Other Studies Reviewed:           Recent Labs: 03/09/2024: TSH 3.520 08/23/2024: BUN 17; Creatinine, Ser 1.09; Hemoglobin 11.8; Platelets 295; Potassium 3.9; Sodium 137   Recent Lipid Panel Lab Results  Component Value Date/Time   CHOL 210 (H) 03/26/2023 09:04 AM   TRIG 123.0 03/26/2023 09:04 AM   HDL 52.40 03/26/2023 09:04 AM   LDLCALC 133 (H) 03/26/2023 09:04 AM    Dated 06/03/20: cholesterol 159, triglycerides 94, HDL 59, LDL 83. Dated 06/15/21: A1c 6.3, Hgb 12.5. chemistries and TSH normal. Dated 11/15/21:A1c 6.3%   Echo 11/30/21: IMPRESSIONS     1. Left ventricular ejection fraction, by estimation, is 60 to 65%. Left  ventricular ejection fraction by 3D volume is 66 %. The left ventricle has  normal function. The left ventricle has no regional wall motion  abnormalities. Left ventricular diastolic   parameters are consistent with Grade III diastolic dysfunction  (restrictive). Elevated left ventricular end-diastolic pressure.   2. Right ventricular systolic function is normal. The right ventricular  size is mildly enlarged. There is normal pulmonary artery systolic  pressure. The estimated right ventricular systolic pressure is 23.4 mmHg.   3. Left atrial size was moderately dilated.   4. Right atrial size was mildly dilated.   5. The mitral valve is normal in structure. No evidence of mitral valve  regurgitation. No evidence of mitral stenosis. Severe mitral annular  calcification.   6. The aortic valve is tricuspid. Aortic valve regurgitation is not  visualized. Aortic valve sclerosis/calcification is present, without any  evidence of aortic stenosis.   7. Aortic dilatation noted. There is borderline dilatation of the  ascending aorta, measuring 37 mm.   8. The inferior vena cava is normal  in size with greater than 50%  respiratory variability, suggesting right atrial pressure of 3 mmHg.    Echo 09/09/23: IMPRESSIONS     1. Left ventricular ejection fraction, by estimation, is 50%. The left  ventricle has mildly decreased function. The left ventricle demonstrates  global hypokinesis with septal-lateral dyssynchrony consistent with LBBB.  There is mild concentric left  ventricular hypertrophy. Left ventricular diastolic parameters are  consistent with Grade II diastolic dysfunction (pseudonormalization).   2. Right ventricular systolic function is mildly reduced. The right  ventricular size is mildly enlarged. There is normal pulmonary artery  systolic pressure. The estimated right ventricular systolic pressure is  25.1 mmHg.   3. Left atrial size was moderately dilated.   4. The mitral valve is degenerative. No evidence of mitral valve  regurgitation. No evidence of mitral stenosis. The mean mitral valve  gradient is 2.5 mmHg. Moderate mitral annular calcification.   5. The aortic valve is tricuspid. There is mild calcification of the  aortic valve. Aortic valve regurgitation is not visualized. No aortic  stenosis is present.   6. The inferior vena cava is normal in size with greater than 50%  respiratory variability, suggesting right atrial pressure of 3 mmHg.   7. A small pericardial effusion is present.   Physical Exam:    VS:  BP (!) 140/80   Pulse 75   Ht 5' 5 (1.651 m)   Wt 154 lb 12.8 oz (70.2 kg)   SpO2 97%   BMI 25.76 kg/m    No data found. I repeat BP and it was 134/80  Wt Readings from Last 3 Encounters:  09/10/24 154 lb 12.8 oz (70.2 kg)  08/31/24 152 lb (68.9 kg)  06/25/24 152 lb 6.4 oz (69.1 kg)     GEN:  Well nourished, well developed in no acute distress HEENT: Normal NECK: No JVD; No carotid bruits CARDIAC: RRR, no murmurs, rubs, gallops RESPIRATORY:  Clear to auscultation without rales, wheezing or rhonchi  ABDOMEN: Soft,  non-tender, non-distended MUSCULOSKELETAL: No edema; No deformity  SKIN: Warm and dry NEUROLOGIC:  Alert and oriented PSYCHIATRIC:  Normal affect     ASSESSMENT AND PLAN     HTN - Essential. See above.  -BP is doing OK. - continue Diltiazem ,hydrochlorothiazide  , losartan , and metoprolol   - monitor BP readings.   - Dash diet. - continue to monitor at home  2. Paroxysmal atrial fibrillation -Status post DCCV in November 2019 and again  in Feb 2023 -Echo showed mild LV dysfunction with dyssynergy due to LBBB. Moderate LAE. EF 50%   -Continue Eliquis  for stroke prevention and higher metoprolol  dose  for rate control.   - had recurrent Afib.  - now s/p  DCCV. In atrially paced rhythm today - s/p ablation in September. Maintaining NSR  3. LBBB chronic.   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   5. HLD. Intolerant to statins.  No known history of vascular disease. Will focus on lifestyle modification for now.   6. Precordial chest pain. Last ischemic evaluation was 7 years ago with Myoview  showing breast attenuation. Recommend evaluation with coronary CTA              Medication Adjustments/Labs and Tests Ordered: Current medicines are reviewed at length with the patient today.  Concerns regarding medicines are outlined above.  Orders Placed This Encounter  Procedures   CT CORONARY MORPH W/CTA COR W/SCORE W/CA W/CM &/OR WO/CM   No  orders of the defined types were placed in this encounter.   There are no Patient Instructions on file for this visit.   Signed, Jaunita Mikels, MD  09/10/2024 9:19 AM    [facility]

## 2024-09-10 ENCOUNTER — Ambulatory Visit: Attending: Cardiovascular Disease | Admitting: Cardiology

## 2024-09-10 ENCOUNTER — Encounter: Payer: Self-pay | Admitting: Cardiology

## 2024-09-10 VITALS — BP 140/80 | HR 75 | Ht 65.0 in | Wt 154.8 lb

## 2024-09-10 DIAGNOSIS — R072 Precordial pain: Secondary | ICD-10-CM

## 2024-09-10 DIAGNOSIS — I480 Paroxysmal atrial fibrillation: Secondary | ICD-10-CM

## 2024-09-10 DIAGNOSIS — F418 Other specified anxiety disorders: Secondary | ICD-10-CM | POA: Diagnosis not present

## 2024-09-10 DIAGNOSIS — Z950 Presence of cardiac pacemaker: Secondary | ICD-10-CM

## 2024-09-10 DIAGNOSIS — I442 Atrioventricular block, complete: Secondary | ICD-10-CM

## 2024-09-10 DIAGNOSIS — I10 Essential (primary) hypertension: Secondary | ICD-10-CM | POA: Diagnosis not present

## 2024-09-10 MED ORDER — APIXABAN 5 MG PO TABS: 5.0000 mg | ORAL_TABLET | Freq: Two times a day (BID) | ORAL | 3 refills | Status: DC

## 2024-09-10 NOTE — Addendum Note (Signed)
 Addended by: CHRISTIANNE CHANNING PARAS on: 09/10/2024 09:36 AM   Modules accepted: Orders

## 2024-09-10 NOTE — Patient Instructions (Addendum)
 Medication Instructions:  Continue all medications *If you need a refill on your cardiac medications before your next appointment, please call your pharmacy*  Lab Work: None ordered  Testing/Procedures: Coronary CTA   will be scheduled after after approved by insurance   Follow instructions below   Follow-Up: At [facility], you and your health needs are our priority.  As part of our continuing mission to provide you with exceptional heart care, our providers are all part of one team.  This team includes your primary Cardiologist (physician) and Advanced Practice Providers or APPs (Physician Assistants and Nurse Practitioners) who all work together to provide you with the care you need, when you need it.  Your next appointment:  To Be Determined    Provider:  Dr.Jordan       Your cardiac CT will be scheduled at one of the below locations:   [facility] [address] [place], [place] [address] [phone] (Severe contrast allergies only)  OR   [facility] [address] [place], [place] [address] [phone]  OR   [facility] [address] [place], [place] [address] [phone]  OR   [facility]. [facility] and Vascular Tower [address]  [place], [place] [address]  OR   [facility] [address] [place], [place] [phone]  If scheduled at [facility], please arrive at the [facility] and Children's Entrance (Entrance C2) of [facility] 30 minutes prior to test start time. You can use the FREE valet parking offered at entrance C (encouraged to control the heart rate for the test)  Proceed to the [facility] Radiology Department (first floor) to check-in and test prep.  All radiology patients and guests should use entrance C2 at [facility], accessed from [facility], even though the hospital's physical address listed is [address]  If  scheduled at the Heart and Vascular Tower at [address], please enter the parking lot using the [address] entrance and use the FREE valet service at the patient drop-off area. Enter the building and check-in with registration on the main floor.  If scheduled at [facility], please arrive to the Heart and Vascular Center 15 mins early for check-in and test prep.  There is spacious parking and easy access to the radiology department from the [facility] Heart and Vascular entrance. Please enter here and check-in with the desk attendant.   If scheduled at [facility], please arrive 30 minutes early for check-in and test prep.  Please follow these instructions carefully (unless otherwise directed):  An IV will be required for this test and Nitroglycerin will be given.  Hold all erectile dysfunction medications at least 3 days (72 hrs) prior to test. (Ie viagra, cialis, sildenafil, tadalafil, etc)   On the Night Before the Test: Be sure to Drink plenty of water. Do not consume any caffeinated/decaffeinated beverages or chocolate 12 hours prior to your test. Do not take any antihistamines 12 hours prior to your test.    On the Day of the Test: Drink plenty of water until 1 hour prior to the test. Do not eat any food 1 hour prior to test. You may take your regular medications prior to the test.  Take metoprolol  100 mg two hours prior to test. If you take Furosemide /Hydrochlorothiazide /Spironolactone/Chlorthalidone, please HOLD on the morning of the test. FEMALES- please wear underwire-free bra if available, avoid dresses & tight  clothing       After the Test: Drink plenty of water. After receiving IV contrast, you may experience a mild flushed feeling. This is normal. On occasion, you may experience a mild rash up to 24 hours after the test. This is not dangerous. If this occurs, you can take Benadryl 25 mg, Zyrtec, Claritin, or Allegra and increase your  fluid intake. (Patients taking Tikosyn should avoid Benadryl, and may take Zyrtec, Claritin, or Allegra) If you experience trouble breathing, this can be serious. If it is severe call 911 IMMEDIATELY. If it is mild, please call our office.  We will call to schedule your test 2-4 weeks out understanding that some insurance companies will need an authorization prior to the service being performed.   For more information and frequently asked questions, please visit our website : [email or web address]  For non-scheduling related questions, please contact the cardiac imaging nurse navigator should you have any questions/concerns: Cardiac Imaging Nurse Navigators Direct Office Dial: [phone]   For scheduling needs, including cancellations and rescheduling, please call Brittany, [phone].   We recommend signing up for the patient portal called MyChart.  Sign up information is provided on this After Visit Summary.  MyChart is used to connect with patients for Virtual Visits (Telemedicine).  Patients are able to view lab/test results, encounter notes, upcoming appointments, etc.  Non-urgent messages can be sent to your provider as well.   To learn more about what you can do with MyChart, go to [email or web address].

## 2024-09-11 ENCOUNTER — Ambulatory Visit: Admitting: Obstetrics and Gynecology

## 2024-09-11 ENCOUNTER — Encounter: Payer: Self-pay | Admitting: Obstetrics and Gynecology

## 2024-09-11 ENCOUNTER — Other Ambulatory Visit (HOSPITAL_COMMUNITY)
Admission: RE | Admit: 2024-09-11 | Discharge: 2024-09-11 | Disposition: A | Discharge status: Home / Self Care | Source: Ambulatory Visit | Attending: Obstetrics and Gynecology | Admitting: Obstetrics and Gynecology

## 2024-09-11 ENCOUNTER — Ambulatory Visit: Payer: Self-pay | Admitting: Cardiovascular Disease

## 2024-09-11 VITALS — BP 156/85 | HR 73 | Ht 63.6 in | Wt 149.3 lb

## 2024-09-11 DIAGNOSIS — N993 Prolapse of vaginal vault after hysterectomy: Secondary | ICD-10-CM | POA: Diagnosis not present

## 2024-09-11 DIAGNOSIS — R82998 Other abnormal findings in urine: Secondary | ICD-10-CM

## 2024-09-11 DIAGNOSIS — R350 Frequency of micturition: Secondary | ICD-10-CM

## 2024-09-11 DIAGNOSIS — R319 Hematuria, unspecified: Secondary | ICD-10-CM

## 2024-09-11 LAB — URINALYSIS, COMPLETE (UACMP) WITH MICROSCOPIC
Bilirubin Urine: NEGATIVE
Glucose, UA: NEGATIVE mg/dL
Ketones, ur: NEGATIVE mg/dL
Nitrite: NEGATIVE
Protein, ur: NEGATIVE mg/dL
Specific Gravity, Urine: 1.015 (ref 1.005–1.030)
pH: 6.5 (ref 5.0–8.0)

## 2024-09-11 LAB — POCT URINALYSIS DIP (CLINITEK)
Bilirubin, UA: NEGATIVE
Glucose, UA: NEGATIVE mg/dL
Ketones, POC UA: NEGATIVE mg/dL
Nitrite, UA: NEGATIVE
POC PROTEIN,UA: NEGATIVE
Spec Grav, UA: 1.015
Urobilinogen, UA: 0.2 U/dL
pH, UA: 7

## 2024-09-11 NOTE — Patient Instructions (Signed)
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  We discussed the fact that it is not life threatening but there are several treatment options. For treatment of pelvic organ prolapse, we discussed options for management including expectant management, conservative management, and surgical management, such as Kegels, a pessary, pelvic floor physical therapy, and specific surgical procedures.    

## 2024-09-11 NOTE — Assessment & Plan Note (Addendum)
 Stage III anterior, Stage III posterior, Stage IV apical prolapse - For treatment of pelvic organ prolapse, we discussed options for management including expectant management, conservative management, and surgical management, such as Kegels, a pessary, pelvic floor physical therapy, and specific surgical procedures (Colpocleisis) - We discussed that due to her cardiac issues (afib with recent ablation on eliquis , pacemaker), would recommend trailing the pessary. We discussed that in many cases prolapse can be successfully managed without surgery. Pt is agreeable to return for a pessary fitting.  - If pessary is not successful, can consider a colpocleisis.

## 2024-09-11 NOTE — Progress Notes (Signed)
 " New Patient Evaluation and Consultation  Referring Provider: Matilda Senior, MD PCP: Purcell Emil Schanz, MD Date of Service: 09/11/2024  SUBJECTIVE Chief Complaint: New Patient (Initial Visit) Lisa [name] is a 83 y.o. female is here for prolapse.)  History of Present Illness: Lisa [name] is a 83 y.o. White or Caucasian female seen in consultation at the request of Dr Matilda for evaluation of prolapse.     Urinary Symptoms: Does not leak urine.   Day time voids 6.  Nocturia: 2-3 times per night to void. Voiding dysfunction:  empties bladder well.  Patient does not use a catheter to empty bladder.  When urinating, patient feels difficulty starting urine stream Drinks: 1 dt mt dew, 1 cup decaf coffee, a few 16oz bottles water per day. Does not drink before she goes to bed.   UTIs: 0 UTI's in the last year.   Denies history of kidney or bladder stones. Has had prior workup for microscopic hematuria years ago.    Pelvic Organ Prolapse Symptoms:                  Patient Admits to a feeling of a bulge the vaginal area. It has been present for 3 months.  Patient Denies seeing a bulge.  This bulge is bothersome. She feels it all the time.   Bowel Symptom: Bowel movements: 1 time(s) per day Stool consistency: soft  Straining: no.  Splinting: no.  Incomplete evacuation: no.  Patient Denies accidental bowel leakage / fecal incontinence Bowel regimen: none  Sexual Function Sexually active: no.   Pelvic Pain Denies pelvic pain   Past Medical History:  Past Medical History:  Diagnosis Date   Bundle branch block, left    Chronic kidney disease (CKD)    Diabetes mellitus without complication (HCC)    Dyslipidemia    GERD (gastroesophageal reflux disease)    Headache    Hypercholesteremia    Hypertension    LBBB (left bundle branch block)    Neoplasm of breast, primary tumor staging category tis: ductal carcinoma in situ (DCIS), left    Pacemaker     Paroxysmal atrial fibrillation (HCC)    UTI (urinary tract infection)      Past Surgical History:   Past Surgical History:  Procedure Laterality Date   ABDOMINAL HYSTERECTOMY     ATRIAL FIBRILLATION ABLATION N/A 05/28/2024   Procedure: ATRIAL FIBRILLATION ABLATION;  Surgeon: Nancey Eulas BRAVO, MD;  Location: [facility] INVASIVE CV LAB;  Service: Cardiovascular;  Laterality: N/A;   bladder polyp removal     BREAST EXCISIONAL BIOPSY Right    60 yrs ago- Benign   BREAST LUMPECTOMY Left    No Visible Scar, said 30 + years ago in situ, no chemo, no radiation, or hormone replacement     CARDIOVERSION N/A 07/30/2018   Procedure: CARDIOVERSION;  Surgeon: Jeffrie Oneil BROCKS, MD;  Location: [facility] ENDOSCOPY;  Service: Cardiovascular;  Laterality: N/A;   CARDIOVERSION N/A 11/28/2021   Procedure: CARDIOVERSION;  Surgeon: Waddell Danelle ORN, MD;  Location: [facility] INVASIVE CV LAB;  Service: Cardiovascular;  Laterality: N/A;   CARDIOVERSION N/A 03/13/2024   Procedure: CARDIOVERSION;  Surgeon: Pietro Redell RAMAN, MD;  Location: [facility] INVASIVE CV LAB;  Service: Cardiovascular;  Laterality: N/A;   CATARACT EXTRACTION     CESAREAN SECTION     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   Procedure: PACEMAKER IMPLANT;  Surgeon: Waddell Danelle ORN, MD;  Location: [facility] INVASIVE CV LAB;  Service: Cardiovascular;  Laterality: N/A;  Past OB/GYN History: OB History  Gravida Para Term Preterm AB Living  2 2 2   2   SAB IAB Ectopic Multiple Live Births          # Outcome Date GA Lbr Len/2nd Weight Sex Type Anes PTL Lv  2 Term      CS-Classical     1 Term      Vag-Forceps       Menopausal: Denies vaginal bleeding since menopause Any history of abnormal pap smears: no.  Medications: Patient has a current medication list which includes the following prescription(s): apixaban , vitamin d3, diltiazem , hydrochlorothiazide , losartan , metoprolol  tartrate, olopatadine hcl, and pantoprazole .   Allergies: Patient is allergic to codeine, fosamax  [alendronate sodium], lisinopril, olmesartan, and amlodipine.   Social History: Social History[1]  Relationship status: married Patient lives with her husband.   Patient is not employed. Regular exercise: Yes:   History of abuse: No  Family History:   Family History  Problem Relation Age of Onset   Breast cancer Mother    Hip fracture Mother    Ulcers Father    CVA Brother    Renal cancer Brother    Other Brother        hip replacement   CVA Brother        Had PaceMaker   Crohn's disease Brother    Diabetes Brother      Review of Systems: Review of Systems  Constitutional:  Negative for fever, malaise/fatigue and weight loss.  Respiratory:  Negative for cough, shortness of breath and wheezing.   Cardiovascular:  Negative for chest pain, palpitations and leg swelling.  Gastrointestinal:  Negative for abdominal pain and blood in stool.  Genitourinary:  Negative for dysuria.  Musculoskeletal:  Negative for myalgias.  Skin:  Negative for rash.  Neurological:  Positive for dizziness. Negative for headaches.  Endo/Heme/Allergies:  Does not bruise/bleed easily.  Psychiatric/Behavioral:  Negative for depression. The patient is not nervous/anxious.      OBJECTIVE Physical Exam: Vitals:   09/11/24 0848  BP: (!) 156/85  Pulse: 73  Weight: 149 lb 4.8 oz (67.7 kg)  Height: 5' 3.6 (1.615 m)    Physical Exam Vitals reviewed. Exam conducted with a chaperone present.  Constitutional:      General: She is not in acute distress. Pulmonary:     Effort: Pulmonary effort is normal.  Abdominal:     General: There is no distension.     Palpations: Abdomen is soft.     Tenderness: There is no abdominal tenderness. There is no rebound.  Musculoskeletal:        General: No swelling. Normal range of motion.  Skin:    General: Skin is warm and dry.     Findings: No rash.  Neurological:     Mental Status: She is alert and oriented to person, place, and time.  Psychiatric:         Mood and Affect: Mood normal.        Behavior: Behavior normal.      GU / Detailed Urogynecologic Evaluation:  Pelvic Exam: Normal external female genitalia; Bartholin's and Skene's glands normal in appearance; urethral meatus normal in appearance, no urethral masses or discharge.   CST: negative  Complete eversion of vaginal vault present.  Speculum exam reveals normal vaginal mucosa with  atrophy and normal vaginal cuff.  Adnexa no mass, fullness, tenderness.    Pelvic floor strength I/V  Pelvic floor musculature: Right levator non-tender, Right obturator non-tender, Left levator  non-tender, Left obturator non-tender  POP-Q:   POP-Q  2.5                                            Aa   2.5                                           Ba  3                                              C   5                                            Gh  4.5                                            Pb  5                                            tvl   2.5                                            Ap  2.5                                            Bp                                                 D      Rectal Exam:  deferred  Post-Void Residual (PVR) by Bladder Scan: In order to evaluate bladder emptying, we discussed obtaining a postvoid residual and patient agreed to this procedure.  Procedure: The ultrasound unit was placed on the patient's abdomen in the suprapubic region after the patient had voided.    Post Void Residual - 09/11/24 0918       Post Void Residual   Post Void Residual 28 mL           Laboratory Results: Lab Results  Component Value Date   COLORU yellow 09/11/2024   CLARITYU clear 09/11/2024   GLUCOSEUR negative 09/11/2024   BILIRUBINUR negative 09/11/2024   KETONESU Negative 06/08/2024   SPECGRAV 1.015 09/11/2024   RBCUR trace-lysed (A) 09/11/2024   PHUR 7.0 09/11/2024   PROTEINUR 1+ (A) 06/08/2024   UROBILINOGEN 0.2 09/11/2024    LEUKOCYTESUR Small (1+) (A) 09/11/2024    Lab Results  Component Value Date   CREATININE 1.09 (  H) 08/23/2024   CREATININE 1.40 (H) 05/20/2024   CREATININE 1.25 (H) 03/09/2024    Lab Results  Component Value Date   HGBA1C 6.3 03/26/2023    Lab Results  Component Value Date   HGB 11.8 (L) 08/23/2024     ASSESSMENT AND PLAN Ms. Fitzgibbons is a 83 y.o. with:  1. Vaginal vault prolapse after hysterectomy   2. Urinary frequency   3. Leukocytes in urine   4. Hematuria, unspecified type     Vaginal vault prolapse after hysterectomy Assessment & Plan: Stage III anterior, Stage III posterior, Stage IV apical prolapse - For treatment of pelvic organ prolapse, we discussed options for management including expectant management, conservative management, and surgical management, such as Kegels, a pessary, pelvic floor physical therapy, and specific surgical procedures (Colpocleisis) - We discussed that due to her cardiac issues (afib with recent ablation on eliquis , pacemaker), would recommend trailing the pessary. We discussed that in many cases prolapse can be successfully managed without surgery. Pt is agreeable to return for a pessary fitting.  - If pessary is not successful, can consider a colpocleisis.    Urinary frequency -     POCT URINALYSIS DIP (CLINITEK)  Leukocytes in urine -     Urine Culture; Future  Hematuria, unspecified type -     Urinalysis, Complete w Microscopic   Return for pessary fitting  Rosaline LOISE Caper, MD        [1]  Social History Tobacco Use   Smoking status: Former    Current packs/day: 0.00    Types: Cigarettes    Start date: 73    Quit date: 1993    Years since quitting: 32.9   Smokeless tobacco: Never   Tobacco comments:    Former smoker 04/17/24  Vaping Use   Vaping status: Never Used  Substance Use Topics   Alcohol use: Yes    Comment: Occasionally   Drug use: No   "

## 2024-09-12 LAB — URINE CULTURE: Culture: <10000 — AB

## 2024-09-16 ENCOUNTER — Encounter (HOSPITAL_COMMUNITY): Payer: Self-pay

## 2024-09-21 ENCOUNTER — Ambulatory Visit (HOSPITAL_COMMUNITY)
Admission: RE | Admit: 2024-09-21 | Discharge: 2024-09-21 | Disposition: A | Discharge status: Home / Self Care | Source: Ambulatory Visit | Attending: Cardiology | Admitting: Cardiology

## 2024-09-21 ENCOUNTER — Ambulatory Visit: Payer: Self-pay | Admitting: Cardiology

## 2024-09-21 DIAGNOSIS — I480 Paroxysmal atrial fibrillation: Secondary | ICD-10-CM | POA: Insufficient documentation

## 2024-09-21 DIAGNOSIS — Z950 Presence of cardiac pacemaker: Secondary | ICD-10-CM | POA: Insufficient documentation

## 2024-09-21 DIAGNOSIS — F418 Other specified anxiety disorders: Secondary | ICD-10-CM | POA: Insufficient documentation

## 2024-09-21 DIAGNOSIS — I10 Essential (primary) hypertension: Secondary | ICD-10-CM | POA: Diagnosis not present

## 2024-09-21 DIAGNOSIS — I442 Atrioventricular block, complete: Secondary | ICD-10-CM | POA: Diagnosis not present

## 2024-09-21 DIAGNOSIS — R072 Precordial pain: Secondary | ICD-10-CM | POA: Diagnosis not present

## 2024-09-21 MED ORDER — IOHEXOL 350 MG/ML SOLN
100.0000 mL | Freq: Once | INTRAVENOUS | Status: AC | PRN
  Administered 2024-09-21: 100 mL via INTRAVENOUS

## 2024-09-21 MED ORDER — NITROGLYCERIN 0.4 MG SL SUBL
0.8000 mg | SUBLINGUAL_TABLET | Freq: Once | SUBLINGUAL | Status: AC
  Administered 2024-09-21: 0.8 mg via SUBLINGUAL

## 2024-09-30 NOTE — Progress Notes (Signed)
 " Cardiology Office Note:    Date:  10/01/2024   ID:  Lisa [name], DOB 28-May-1941, MRN [id number]  PCP:  Purcell Emil Schanz, MD [facility] Cardiologist: Akilah Cureton, MD   Reason for visit: Follow-up Afib and HTN  History of Present Illness:    Lisa [name] is a 84 y.o. female with a hx of atrial fibrillation s/p DCCV in 07/2018 (sx SOB, swelling, indigestion), hypertension, hyperlipidemia, left bundle branch block.   Myoview  in January 2019 was normal.    She was seen in ED at [facility] in Jan 2023. She had an episode of syncope at home. Following this she had acute N/V and diarrhea.  Ecg showed NSR with chronic LBBB. BP was quite high to 202/110. Labs including troponins were negative. She hasn't had any further dizziness or syncope but has persistent elevated BP.  She was on metoprolol , losartan  and HCTZ.  When seen in Feb she was in NSR. Later presented in March with recurrent Afib. She underwent successful DCCV. 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 She was admitted and underwent placement of dual chamber pacemaker by Dr Waddell. Echo was unremarkable. She was DC on metoprolol  and Eliquis . Prior renal artery duplex was negative for RAS.  I saw her on Dec 3 and she was doing well. She was admitted overnight on 12/15 for palpitations. Pacer interrogation showed Afib with RVR. She converted. Had atypical chest pain. Minimal troponin leak metoprolol  was increased to 50 mg bid and losartan  and HCT held. I saw her on 12/26 and she was doing ok. BP was high. Started on low dose hydralazine .  Seen in EP office on 12/31 and was in NSR then.   She had recurrent Afib in June and had successful DCCV on 6/20. She was seen by Dr Nancey and had ablation on Sept 4. Continued on diltiazem  and metoprolol .   On follow up today she notes after Thanksgiving she had an episode of dizziness, nausea and precordial chest  pain/heaviness. Went to ED. Ecg and troponins OK. Has felt OK since then. No afib noted. Reports at home BP 127-135/70. 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  She notes for the past 3 weeks her BP has been running higher. Notes she lost a crown and will need dental extraction. Reviewing records apparent intolerance of simvastatin  due to N/V. She reports no chest pain today      Past Medical History:  Diagnosis Date   Bundle branch block, left    Chronic kidney disease (CKD)    Diabetes mellitus without complication (HCC)    Dyslipidemia    GERD (gastroesophageal reflux disease)    Headache    Hypercholesteremia    Hypertension    LBBB (left bundle branch block)    Neoplasm of breast, primary tumor staging category tis: ductal carcinoma in situ (DCIS), left    Pacemaker    Paroxysmal atrial fibrillation (HCC)    UTI (urinary tract infection)     Past Surgical History:  Procedure Laterality Date   ABDOMINAL HYSTERECTOMY     ATRIAL FIBRILLATION ABLATION N/A 05/28/2024   Procedure: ATRIAL FIBRILLATION ABLATION;  Surgeon: Nancey Eulas BRAVO, MD;  Location: [facility] INVASIVE CV LAB;  Service: Cardiovascular;  Laterality: N/A;   bladder polyp removal     BREAST EXCISIONAL BIOPSY Right    60 yrs ago- Benign   BREAST LUMPECTOMY Left    No Visible  Scar, said 30 + years ago in situ, no chemo, no radiation, or hormone replacement     CARDIOVERSION N/A 07/30/2018   Procedure: CARDIOVERSION;  Surgeon: Jeffrie Oneil BROCKS, MD;  Location: [facility] ENDOSCOPY;  Service: Cardiovascular;  Laterality: N/A;   CARDIOVERSION N/A 11/28/2021   Procedure: CARDIOVERSION;  Surgeon: Waddell Danelle ORN, MD;  Location: [facility] INVASIVE CV LAB;  Service: Cardiovascular;  Laterality: N/A;   CARDIOVERSION N/A 03/13/2024   Procedure: CARDIOVERSION;  Surgeon: Pietro Redell RAMAN, MD;  Location: [facility] INVASIVE CV LAB;  Service: Cardiovascular;  Laterality: N/A;   CATARACT EXTRACTION     CESAREAN SECTION     PACEMAKER IMPLANT  N/A 12/04/2021   Procedure: PACEMAKER IMPLANT;  Surgeon: Waddell Danelle ORN, MD;  Location: [facility] INVASIVE CV LAB;  Service: Cardiovascular;  Laterality: N/A;    Current Medications: Current Meds  Medication Sig   apixaban  (ELIQUIS ) 5 MG TABS tablet Take 1 tablet (5 mg total) by mouth 2 (two) times daily.   Cholecalciferol (VITAMIN D3) 50 MCG (2000 UT) TABS Take 2,000 Units by mouth daily.   diltiazem  (CARDIZEM  CD) 240 MG 24 hr capsule Take 1 capsule (240 mg total) by mouth daily.   hydrochlorothiazide  (MICROZIDE ) 12.5 MG capsule Take 1 capsule (12.5 mg total) by mouth daily.   losartan  (COZAAR ) 100 MG tablet TAKE 1 TABLET BY MOUTH DAILY GENERIC EQUIVALENT FOR COZAAR    metoprolol  tartrate (LOPRESSOR ) 50 MG tablet Take 1 tablet (50 mg total) by mouth 2 (two) times daily.   Olopatadine HCl (PATADAY OP) Place 1 drop into both eyes in the morning.   pantoprazole  (PROTONIX ) 40 MG tablet TAKE 1 TABLET BY MOUTH ONCE DAILY IN THE MORNING -  IF  SYMPTOMS  RETURN  GO  BACK  TO  TWICE  DAILY   rosuvastatin  (CRESTOR ) 10 MG tablet Take 1 tablet (10 mg total) by mouth daily.   [DISCONTINUED] diltiazem  (CARDIZEM  CD) 120 MG 24 hr capsule Take 1 capsule (120 mg total) by mouth daily.     Allergies:   Codeine, Fosamax [alendronate sodium], Lisinopril, Olmesartan, and Amlodipine   Social History   Socioeconomic History   Marital status: Married    Spouse name: Doraine Schexnider   Number of children: 2   Years of education: Not on file   Highest education level: Associate degree: academic program  Occupational History   Not on file  Tobacco Use   Smoking status: Former    Current packs/day: 0.00    Types: Cigarettes    Start date: 1973    Quit date: 1993    Years since quitting: 33.0   Smokeless tobacco: Never   Tobacco comments:    Former smoker 04/17/24  Vaping Use   Vaping status: Never Used  Substance and Sexual Activity   Alcohol use: Yes    Comment: Occasionally   Drug use: No   Sexual  activity: Not Currently  Other Topics Concern   Not on file  Social History Narrative   Not on file   Social Drivers of Health   Tobacco Use: Medium Risk (09/11/2024)   Patient History    Smoking Tobacco Use: Former    Smokeless Tobacco Use: Never    Passive Exposure: Not on file  Financial Resource Strain: Patient Declined (04/03/2024)   Overall Financial Resource Strain (CARDIA)    Difficulty of Paying Living Expenses: Patient declined  Food Insecurity: Low Risk (07/16/2024)   Received from [facility]   Epic    Within the past 12 months,  you worried that your food would run out before you got money to buy more: Never true    Within the past 12 months, the food you bought just didn't last and you didn't have money to get more. : Never true  Transportation Needs: No Transportation Needs (07/16/2024)   Received from [facility]    In the past 12 months, has lack of reliable transportation kept you from medical appointments, meetings, work or from getting things needed for daily living? : No  Physical Activity: Unknown (04/03/2024)   Exercise Vital Sign    Days of Exercise per Week: 4 days    Minutes of Exercise per Session: Not on file  Stress: Stress Concern Present (04/03/2024)   [employer] of Occupational Health - Occupational Stress Questionnaire    Feeling of Stress: Very much  Social Connections: Moderately Isolated (04/03/2024)   Social Connection and Isolation Panel    Frequency of Communication with Friends and Family: Twice a week    Frequency of Social Gatherings with Friends and Family: Once a week    Attends Religious Services: Never    Database Administrator or Organizations: No    Attends Banker Meetings: Not on file    Marital Status: Married  Depression (PHQ2-9): Low Risk (05/21/2024)   Depression (PHQ2-9)    PHQ-2 Score: 1  Alcohol Screen: Low Risk (10/07/2023)   Alcohol Screen    Last Alcohol Screening Score (AUDIT):  1  Housing: Low Risk (07/16/2024)   Received from [facility]   Epic    Think about the place you live. Do you have problems with any of the following? Choose all that apply:: None/None on this list    What is your living situation today?: I have a steady place to live  Utilities: Low Risk (07/16/2024)   Received from [facility]   Utilities    In the past 12 months has the electric, gas, oil, or water company threatened to shut off services in your home? : No  Health Literacy: Adequate Health Literacy (04/30/2023)   B1300 Health Literacy    Frequency of need for help with medical instructions: Never     Family History: The patient's family history includes Breast cancer in her mother; CVA in her brother and brother; Crohn's disease in her brother; Diabetes in her brother; Hip fracture in her mother; Other in her brother; Renal cancer in her brother; Ulcers in her father.  ROS:   Please see the history of present illness.     EKGs/Labs/Other Studies Reviewed:           Recent Labs: 03/09/2024: TSH 3.520 08/23/2024: BUN 17; Creatinine, Ser 1.09; Hemoglobin 11.8; Platelets 295; Potassium 3.9; Sodium 137   Recent Lipid Panel Lab Results  Component Value Date/Time   CHOL 210 (H) 03/26/2023 09:04 AM   TRIG 123.0 03/26/2023 09:04 AM   HDL 52.40 03/26/2023 09:04 AM   LDLCALC 133 (H) 03/26/2023 09:04 AM    Dated 06/03/20: cholesterol 159, triglycerides 94, HDL 59, LDL 83. Dated 06/15/21: A1c 6.3, Hgb 12.5. chemistries and TSH normal. Dated 11/15/21:A1c 6.3%   Echo 11/30/21: IMPRESSIONS     1. Left ventricular ejection fraction, by estimation, is 60 to 65%. Left  ventricular ejection fraction by 3D volume is 66 %. The left ventricle has  normal function. The left ventricle has no regional wall motion  abnormalities. Left ventricular diastolic   parameters are consistent with Grade III diastolic dysfunction  (  restrictive). Elevated left ventricular end-diastolic pressure.   2.  Right ventricular systolic function is normal. The right ventricular  size is mildly enlarged. There is normal pulmonary artery systolic  pressure. The estimated right ventricular systolic pressure is 23.4 mmHg.   3. Left atrial size was moderately dilated.   4. Right atrial size was mildly dilated.   5. The mitral valve is normal in structure. No evidence of mitral valve  regurgitation. No evidence of mitral stenosis. Severe mitral annular  calcification.   6. The aortic valve is tricuspid. Aortic valve regurgitation is not  visualized. Aortic valve sclerosis/calcification is present, without any  evidence of aortic stenosis.   7. Aortic dilatation noted. There is borderline dilatation of the  ascending aorta, measuring 37 mm.   8. The inferior vena cava is normal in size with greater than 50%  respiratory variability, suggesting right atrial pressure of 3 mmHg.    Echo 09/09/23: IMPRESSIONS     1. Left ventricular ejection fraction, by estimation, is 50%. The left  ventricle has mildly decreased function. The left ventricle demonstrates  global hypokinesis with septal-lateral dyssynchrony consistent with LBBB.  There is mild concentric left  ventricular hypertrophy. Left ventricular diastolic parameters are  consistent with Grade II diastolic dysfunction (pseudonormalization).   2. Right ventricular systolic function is mildly reduced. The right  ventricular size is mildly enlarged. There is normal pulmonary artery  systolic pressure. The estimated right ventricular systolic pressure is  25.1 mmHg.   3. Left atrial size was moderately dilated.   4. The mitral valve is degenerative. No evidence of mitral valve  regurgitation. No evidence of mitral stenosis. The mean mitral valve  gradient is 2.5 mmHg. Moderate mitral annular calcification.   5. The aortic valve is tricuspid. There is mild calcification of the  aortic valve. Aortic valve regurgitation is not visualized. No aortic   stenosis is present.   6. The inferior vena cava is normal in size with greater than 50%  respiratory variability, suggesting right atrial pressure of 3 mmHg.   7. A small pericardial effusion is present.   Cardiac CTA: 09/21/24: Cardiac/Coronary CTA   TECHNIQUE: A non-contrast, gated CT scan was obtained with axial slices of 2.5 mm through the heart for calcium  scoring. Calcium  scoring was performed using the Agatston method. A 120 kV prospective, gated, contrast cardiac CT scan was obtained. Gantry rotation speed was 230 msec and collimation was 0.63 mm. Two sublingual nitroglycerin  tablets (0.8 mg) were given. The 3D data set was reconstructed with motion correction for the best systolic or diastolic phase. Images were analyzed on a dedicated workstation using MPR, MIP, and VRT modes. The patient received 95 cc of contrast.   FINDINGS: Image quality: Excellent.   Noise artifact is: Limited.   Coronary Arteries:  Normal coronary origin.  Right dominance.   Left main: The left main is a large caliber vessel with a normal take off from the left coronary cusp that bifurcates to form a left anterior descending artery and a left circumflex artery. There is minimal (0-24) ostial plaque.   Left anterior descending artery: The LAD has mild (25-49) mixed plaque stenosis in the proximal vessel. The LAD gives off 2 patent diagonal branches.   Left circumflex artery: The LCX is non-dominant has minimal (0-24) plaque in the mid vessel. The LCX gives off 1 patent obtuse marginal branch.   Right coronary artery: The RCA is dominant with normal take off from the right coronary cusp. There is  no evidence of plaque or stenosis. The RCA terminates as a PDA and right posterolateral branch without evidence of plaque or stenosis.   Right Atrium: Right atrial size is dilated.   Right Ventricle: The right ventricular cavity is within normal limits. Pacemaker leads noted.   Left Atrium:  Left atrial size is dilated with no left atrial appendage filling defect.   Left Ventricle: The ventricular cavity size is within normal limits.   Pulmonary arteries: Normal in size.   Pulmonary veins: Normal pulmonary venous drainage.   Pericardium: Normal thickness without significant effusion or calcium  present.   Cardiac valves: The aortic valve is trileaflet with calcification. The mitral valve is normal without significant calcification.   Aorta: Normal caliber with aortic atherosclerosis.   Extra-cardiac findings: See attached radiology report for non-cardiac structures.   IMPRESSION: 1. Coronary calcium  score of 339. This was 25 percentile for age-, sex, and race-matched controls.   2. Aortic atherosclerosis.   3. Normal coronary origin with right dominance.   4. Mild (25-49) mixed plaque stenosis in the proximal LAD and minimal plaque in the LM and Lcx.   5. Pacemaker leads noted.   RECOMMENDATIONS: CAD-RADS 2: Mild non-obstructive CAD (25-49%). Consider non-atherosclerotic causes of chest pain. Consider preventive therapy and risk factor modification.   Redell Shallow, MD     Electronically Signed   By: Redell Shallow M.D.   On: 09/21/2024 11:43    Physical Exam:    VS:  BP (!) 174/90 (BP Location: Left Arm, Patient Position: Sitting, Cuff Size: Normal)   Pulse 78   Ht 5' 3 (1.6 m)   Wt 153 lb (69.4 kg)   SpO2 95%   BMI 27.10 kg/m    No data found. I repeat BP and it was 168/80  Wt Readings from Last 3 Encounters:  10/01/24 153 lb (69.4 kg)  09/11/24 149 lb 4.8 oz (67.7 kg)  09/10/24 154 lb 12.8 oz (70.2 kg)     GEN:  Well nourished, well developed in no acute distress HEENT: Normal NECK: No JVD; No carotid bruits CARDIAC: RRR, no murmurs, rubs, gallops RESPIRATORY:  Clear to auscultation without rales, wheezing or rhonchi  ABDOMEN: Soft, non-tender, non-distended MUSCULOSKELETAL: No edema; No deformity  SKIN: Warm and  dry NEUROLOGIC:  Alert and oriented PSYCHIATRIC:  Normal affect     ASSESSMENT AND PLAN     HTN - Essential. BP is higher -BP is doing OK. - continue hydrochlorothiazide  , losartan , and metoprolol   - will increase diltiazem  to 240 mg daily.  -did not tolerate higher HCT dose in the past due to orthostasis. - Dash diet. - continue to monitor at home  2. Paroxysmal atrial fibrillation -Status post DCCV in November 2019 and again  in Feb 2023 -Echo showed mild LV dysfunction with dyssynergy due to LBBB. Moderate LAE. EF 50%   -Continue Eliquis  for stroke prevention and higher metoprolol  dose  for rate control.   - had recurrent Afib.  - now s/p  DCCV. In atrially paced rhythm today - s/p ablation in September. Maintaining NSR  3. LBBB chronic.   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   5. HLD. 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 Repeat fasting lab in 2-3 months  6. Precordial chest pain. Coronary CTA showed nonobstructive CAD- resolved.   Follow up in 2-3 months  Medication Adjustments/Labs and Tests Ordered: Current medicines are reviewed at length with the patient today.  Concerns regarding medicines are outlined above.  No orders of the defined types were placed in this encounter.  Meds ordered this encounter  Medications   diltiazem  (CARDIZEM  CD) 240 MG 24 hr capsule    Sig: Take 1 capsule (240 mg total) by mouth daily.    Dispense:  90 capsule    Refill:  3   rosuvastatin  (CRESTOR ) 10 MG tablet    Sig: Take 1 tablet (10 mg total) by mouth daily.    Dispense:  90 tablet    Refill:  3    Patient Instructions  Medication Instructions:   *If you need a refill on your cardiac medications before your next appointment, please call your pharmacy*  Lab Work:    Testing/Procedures:   Follow-Up: At [facility], you and your health needs  are our priority.  As part of our continuing mission to provide you with exceptional heart care, our providers are all part of one team.  This team includes your primary Cardiologist (physician) and Advanced Practice Providers or APPs (Physician Assistants and Nurse Practitioners) who all work together to provide you with the care you need, when you need it.  Your next appointment:      Provider:      We recommend signing up for the patient portal called MyChart.  Sign up information is provided on this After Visit Summary.  MyChart is used to connect with patients for Virtual Visits (Telemedicine).  Patients are able to view lab/test results, encounter notes, upcoming appointments, etc.  Non-urgent messages can be sent to your provider as well.   To learn more about what you can do with MyChart, go to [email or web address].     Signed, Brook Geraci, MD  10/01/2024 9:19 AM    [facility] "

## 2024-10-01 ENCOUNTER — Ambulatory Visit: Attending: Cardiology | Admitting: Cardiology

## 2024-10-01 ENCOUNTER — Telehealth (HOSPITAL_BASED_OUTPATIENT_CLINIC_OR_DEPARTMENT_OTHER): Payer: Self-pay

## 2024-10-01 VITALS — BP 174/90 | HR 78 | Ht 63.0 in | Wt 153.0 lb

## 2024-10-01 DIAGNOSIS — I10 Essential (primary) hypertension: Secondary | ICD-10-CM | POA: Diagnosis not present

## 2024-10-01 DIAGNOSIS — I25118 Atherosclerotic heart disease of native coronary artery with other forms of angina pectoris: Secondary | ICD-10-CM

## 2024-10-01 DIAGNOSIS — Z950 Presence of cardiac pacemaker: Secondary | ICD-10-CM

## 2024-10-01 DIAGNOSIS — E7800 Pure hypercholesterolemia, unspecified: Secondary | ICD-10-CM

## 2024-10-01 DIAGNOSIS — I480 Paroxysmal atrial fibrillation: Secondary | ICD-10-CM | POA: Diagnosis not present

## 2024-10-01 MED ORDER — ROSUVASTATIN CALCIUM 10 MG PO TABS: 10.0000 mg | ORAL_TABLET | Freq: Every day | ORAL | 3 refills | Status: AC

## 2024-10-01 MED ORDER — DILTIAZEM HCL ER COATED BEADS 240 MG PO CP24: 240.0000 mg | ORAL_CAPSULE | Freq: Every day | ORAL | 3 refills | Status: AC

## 2024-10-01 NOTE — Patient Instructions (Addendum)
 Medication Instructions:  Increase Diltiazem  to 240 mg daily Start Crestor  10 mg daily Continue all other medications *If you need a refill on your cardiac medications before your next appointment, please call your pharmacy*  Lab Work: Have fasting bmet,lipid and hepatic panels 1 week before appointment    Testing/Procedures: None ordered  Follow-Up: At [facility], you and your health needs are our priority.  As part of our continuing mission to provide you with exceptional heart care, our providers are all part of one team.  This team includes your primary Cardiologist (physician) and Advanced Practice Providers or APPs (Physician Assistants and Nurse Practitioners) who all work together to provide you with the care you need, when you need it.  Your next appointment:  Thursday 3/19 at 8:40 am    Provider:  Dr.Jordan    We recommend signing up for the patient portal called MyChart.  Sign up information is provided on this After Visit Summary.  MyChart is used to connect with patients for Virtual Visits (Telemedicine).  Patients are able to view lab/test results, encounter notes, upcoming appointments, etc.  Non-urgent messages can be sent to your provider as well.   To learn more about what you can do with MyChart, go to [email or web address].

## 2024-10-01 NOTE — Telephone Encounter (Signed)
"  ° °  Pre-operative Risk Assessment    Patient Name: Lisa [name]  DOB: 1941/01/23 MRN: [id number]   Date of last office visit: 10/01/2024 with Dr. Jordan Date of next office visit: 12/18/2024 with Dr. Jordan  Request for Surgical Clearance    Procedure:  Dental Extraction - Amount of Teeth to be Pulled:  ONE surgical extraction with hemostatic dressing to promote healing  Date of Surgery:  Clearance TBD                                 Surgeon:  Burnard Mohs, DDS Surgeon's Group or Practice Name:  [facility] Phone number:  [phone] Fax number:  [phone]   Type of Clearance Requested:   - Medical  - Pharmacy:  Hold Apixaban  (Eliquis ) 1-2 days prior to surgery   Type of Anesthesia:  Local    Additional requests/questions:  PT HAS A BIOTRONIK PPM  Signed, Patrcia Iverson CROME   10/01/2024, 5:00 PM   "

## 2024-10-02 ENCOUNTER — Encounter: Payer: Self-pay | Admitting: Cardiovascular Disease

## 2024-10-02 NOTE — Progress Notes (Signed)
 PERIOPERATIVE PRESCRIPTION FOR IMPLANTED CARDIAC DEVICE PROGRAMMING  Patient Information: Name:  Lisa [name]  DOB:  1940/10/12  MRN:  [id number]  Procedure:  Dental Extraction - Amount of Teeth to be Pulled:  ONE surgical extraction with hemostatic dressing to promote healing   Date of Surgery:  Clearance TBD                                  Surgeon:  Burnard Mohs, DDS Surgeon's Group or Practice Name:  [facility] Phone number:  [phone] Fax number:  [phone]   Type of Clearance Requested:   - Medical  - Pharmacy:  Hold Apixaban  (Eliquis ) 1-2 days prior to surgery   Type of Anesthesia:  Local    Device Information:  Clinic EP Physician:  DR. EULAS FURBISH  Device Type:  Pacemaker Manufacturer and Phone #:  [employer]: [phone] Pacemaker Dependent?:  Yes.   Date of Last Device Check:  09/01/24  Remote  Normal Device Function?:  Yes.    Electrophysiologist's Recommendations:  Have magnet available. Provide continuous ECG monitoring when magnet is used or reprogramming is to be performed.  Procedure may interfere with device function.  Magnet should be placed over device during procedure.  Per Device Clinic Standing Orders, Prentice JINNY Silvan, RN  7:48 AM 10/02/2024

## 2024-10-02 NOTE — Telephone Encounter (Signed)
"  ° °  Patient Name: Lisa [name]  DOB: [birth year] MRN: [id number]  Primary Cardiologist: Peter Jordan, MD  Chart reviewed as part of pre-operative protocol coverage.   Patient may hold Eliquis  for 1 day prior to the procedure given the higher bleeding risk with surgical extraction.  She should resume Eliquis  as soon as possible afterward at the surgeon's discretion.  The patient was advised that if she develops new symptoms prior to surgery to contact our office to arrange for a follow-up visit, and she verbalized understanding.  SBE prophylaxis is not required for the patient from a cardiac standpoint. (There is no need for SBE prophylaxis for her pacemaker)  I will route this recommendation to the requesting party via Epic fax function and remove from pre-op pool.  Please call with questions.  Jalasia Eskridge, [place] 10/02/2024, 8:22 AM  "

## 2024-10-05 NOTE — Telephone Encounter (Signed)
 Caller [facility]) is follow-up on the status of patient's clearance.

## 2024-10-06 ENCOUNTER — Encounter: Payer: Self-pay | Admitting: Cardiology

## 2024-10-06 NOTE — Telephone Encounter (Signed)
 Lisa [name] calling to check on the clearance status

## 2024-10-06 NOTE — Telephone Encounter (Signed)
 Faxed over recommendation via EPIC.

## 2024-10-07 ENCOUNTER — Ambulatory Visit (INDEPENDENT_AMBULATORY_CARE_PROVIDER_SITE_OTHER): Admitting: Emergency Medicine

## 2024-10-07 ENCOUNTER — Encounter: Payer: Self-pay | Admitting: Emergency Medicine

## 2024-10-07 VITALS — BP 116/60 | HR 89 | Temp 98.0°F | Ht 63.0 in | Wt 153.0 lb

## 2024-10-07 DIAGNOSIS — D6859 Other primary thrombophilia: Secondary | ICD-10-CM | POA: Diagnosis not present

## 2024-10-07 DIAGNOSIS — I5032 Chronic diastolic (congestive) heart failure: Secondary | ICD-10-CM | POA: Diagnosis not present

## 2024-10-07 DIAGNOSIS — Z7901 Long term (current) use of anticoagulants: Secondary | ICD-10-CM

## 2024-10-07 DIAGNOSIS — E785 Hyperlipidemia, unspecified: Secondary | ICD-10-CM

## 2024-10-07 DIAGNOSIS — I10 Essential (primary) hypertension: Secondary | ICD-10-CM

## 2024-10-07 DIAGNOSIS — F418 Other specified anxiety disorders: Secondary | ICD-10-CM | POA: Diagnosis not present

## 2024-10-07 DIAGNOSIS — K219 Gastro-esophageal reflux disease without esophagitis: Secondary | ICD-10-CM | POA: Diagnosis not present

## 2024-10-07 DIAGNOSIS — Z95811 Presence of heart assist device: Secondary | ICD-10-CM

## 2024-10-07 DIAGNOSIS — I480 Paroxysmal atrial fibrillation: Secondary | ICD-10-CM | POA: Diagnosis not present

## 2024-10-07 DIAGNOSIS — I442 Atrioventricular block, complete: Secondary | ICD-10-CM

## 2024-10-07 DIAGNOSIS — N1831 Chronic kidney disease, stage 3a: Secondary | ICD-10-CM | POA: Diagnosis not present

## 2024-10-07 MED ORDER — ALPRAZOLAM 0.5 MG PO TABS: 0.5000 mg | ORAL_TABLET | Freq: Two times a day (BID) | ORAL | 1 refills | Status: AC | PRN

## 2024-10-07 NOTE — Progress Notes (Signed)
 Lisa [name] 84 y.o.   Chief Complaint  Patient presents with   Follow-up    Pt states that she needs a script for nausea and anxiety     HISTORY OF PRESENT ILLNESS: This is a 84 y.o. female here for 75-month follow-up of chronic medical conditions Overall doing well. Since her last visit she went to emergency department last November for nonspecific chest pain Was able to follow-up with Dr. Jordan, cardiologist.  Dose of Cardizem  was increased to 240 mg daily.  Was started on rosuvastatin  also. Scheduled to see Dr. for vaginal prolapse next Friday Symptoms of GERD much improved with pantoprazole .  Has not been able to follow-up with GI doctor for possible upper endoscopy. Recent cardiologist office visit notes assessment and plan as follows: Visit last week 2026 ASSESSMENT AND PLAN        HTN - Essential. BP is higher -BP is doing OK. - continue hydrochlorothiazide  , losartan , and metoprolol   - will increase diltiazem  to 240 mg daily.  -did not tolerate higher HCT dose in the past due to orthostasis. - Dash diet. - continue to monitor at home   2. Paroxysmal atrial fibrillation -Status post DCCV in November 2019 and again  in Feb 2023 -Echo showed mild LV dysfunction with dyssynergy due to LBBB. Moderate LAE. EF 50%   -Continue Eliquis  for stroke prevention and higher metoprolol  dose  for rate control.   - had recurrent Afib.  - now s/p  DCCV. In atrially paced rhythm today - s/p ablation in September. Maintaining NSR   3. LBBB chronic.    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    5. HLD. 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 Repeat fasting lab in 2-3 months   6. Precordial chest pain. Coronary CTA showed nonobstructive CAD- resolved.    Follow up in 2-3 months           HPI   Prior to Admission medications  Medication Sig Start Date End Date Taking? Authorizing  Provider  apixaban  (ELIQUIS ) 5 MG TABS tablet Take 1 tablet (5 mg total) by mouth 2 (two) times daily. 09/10/24  Yes Jordan, Peter M, MD  Cholecalciferol (VITAMIN D3) 50 MCG (2000 UT) TABS Take 2,000 Units by mouth daily.   Yes [provider]  diltiazem  (CARDIZEM  CD) 240 MG 24 hr capsule Take 1 capsule (240 mg total) by mouth daily. 10/01/24 12/30/24 Yes Jordan, Peter M, MD  hydrochlorothiazide  (MICROZIDE ) 12.5 MG capsule Take 1 capsule (12.5 mg total) by mouth daily. 06/18/24  Yes Jordan, Peter M, MD  losartan  (COZAAR ) 100 MG tablet TAKE 1 TABLET BY MOUTH DAILY GENERIC EQUIVALENT FOR COZAAR  06/16/24  Yes Jordan, Peter M, MD  metoprolol  tartrate (LOPRESSOR ) 50 MG tablet Take 1 tablet (50 mg total) by mouth 2 (two) times daily. 06/18/24  Yes Jordan, Peter M, MD  Olopatadine HCl (PATADAY OP) Place 1 drop into both eyes in the morning.   Yes [provider]  pantoprazole  (PROTONIX ) 40 MG tablet TAKE 1 TABLET BY MOUTH ONCE DAILY IN THE MORNING -  IF  SYMPTOMS  RETURN  GO  BACK  TO  TWICE  DAILY 07/06/24  Yes Armbruster, Elspeth SQUIBB, MD  rosuvastatin  (CRESTOR ) 10 MG tablet Take 1 tablet (10 mg total) by mouth daily. 10/01/24 12/30/24 Yes Jordan, Peter M, MD    Allergies[1]  Patient Active Problem List   Diagnosis Date Noted  Presence of heart assist device (HCC) 10/07/2024   Vaginal vault prolapse after hysterectomy 09/11/2024   Vaginal prolapse 05/21/2024   Situational anxiety 04/06/2024   Chronic diastolic CHF (congestive heart failure) (HCC) 09/08/2023   Statin intolerance 03/26/2023   Thrombophilia 09/25/2022   Current use of long term anticoagulation 09/25/2022   Dyslipidemia 03/14/2022   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   Age-related osteoporosis without current pathological fracture 11/27/2021   Chronic kidney disease, stage 3a (HCC) 11/27/2021   Essential hypertension 11/27/2021   Impaired fasting glucose 11/27/2021   Personal history of malignant neoplasm of breast  11/27/2021   Personal history of urinary calculi 11/27/2021   Vitamin D deficiency 11/27/2021   Changing skin lesion 05/12/2021   Actinic keratosis 02/24/2019   Paroxysmal atrial fibrillation (HCC)    GERD (gastroesophageal reflux disease)    Left bundle branch block     Past Medical History:  Diagnosis Date   Bundle branch block, left    Chronic kidney disease (CKD)    Diabetes mellitus without complication (HCC)    Dyslipidemia    GERD (gastroesophageal reflux disease)    Headache    Hypercholesteremia    Hypertension    LBBB (left bundle branch block)    Neoplasm of breast, primary tumor staging category tis: ductal carcinoma in situ (DCIS), left    Pacemaker    Paroxysmal atrial fibrillation (HCC)    UTI (urinary tract infection)     Past Surgical History:  Procedure Laterality Date   ABDOMINAL HYSTERECTOMY     ATRIAL FIBRILLATION ABLATION N/A 05/28/2024   Procedure: ATRIAL FIBRILLATION ABLATION;  Surgeon: Nancey Eulas BRAVO, MD;  Location: [facility];  Service: Cardiovascular;  Laterality: N/A;   bladder polyp removal     BREAST EXCISIONAL BIOPSY Right    60 yrs ago- Benign   BREAST LUMPECTOMY Left    No Visible Scar, said 30 + years ago in situ, no chemo, no radiation, or hormone replacement     CARDIOVERSION N/A 07/30/2018   Procedure: CARDIOVERSION;  Surgeon: Jeffrie Oneil BROCKS, MD;  Location: [facility] ENDOS[facility];  Service: Cardiovascular;  Laterality: N/A;   CARDIOVERSION N/A 11/28/2021   Procedure: CARDIOVERSION;  Surgeon: Waddell Danelle ORN, MD;  Location: [facility];  Service: Cardiovascular;  Laterality: N/A;   CARDIOVERSION N/A 03/13/2024   Procedure: CARDIOVERSION;  Surgeon: Pietro Redell RAMAN, MD;  Location: [facility];  Service: Cardiovascular;  Laterality: N/A;   CATARACT EXTRACTION     CESAREAN SECTION     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   Procedure: PACEMAKER IMPLANT;  Surgeon: Waddell Danelle ORN, MD;  Location: [facility];  Service:  Cardiovascular;  Laterality: N/A;    Social History   Socioeconomic History   Marital status: Married    Spouse name: Evaleen Sant   Number of children: 2   Years of education: Not on file   Highest education level: Associate degree: academic program  Occupational History   Not on file  Tobacco Use   Smoking status: Former    Current packs/day: 0.00    Types: Cigarettes    Start date: 64    Quit date: 1993    Years since quitting: 33.0   Smokeless tobacco: Never   Tobacco comments:    Former smoker 04/17/24  Vaping Use   Vaping status: Never Used  Substance and Sexual Activity   Alcohol use: Yes    Comment: Occasionally   Drug use: No   Sexual activity:  Not Currently  Other Topics Concern   Not on file  Social History Narrative   Not on file   Social Drivers of Health   Tobacco Use: Medium Risk (10/07/2024)   Patient History    Smoking Tobacco Use: Former    Smokeless Tobacco Use: Never    Passive Exposure: Not on file  Financial Resource Strain: Low Risk (10/05/2024)   Overall Financial Resource Strain (CARDIA)    Difficulty of Paying Living Expenses: Not hard at all  Food Insecurity: No Food Insecurity (10/05/2024)   Epic    Worried About Programme Researcher, Broadcasting/film/video in the Last Year: Never true    Ran Out of Food in the Last Year: Never true  Transportation Needs: No Transportation Needs (10/05/2024)   Epic    Lack of Transportation (Medical): No    Lack of Transportation (Non-Medical): No  Physical Activity: Unknown (04/03/2024)   Exercise Vital Sign    Days of Exercise per Week: 4 days    Minutes of Exercise per Session: Not on file  Stress: No Stress Concern Present (10/05/2024)   [employer] of Occupational Health - Occupational Stress Questionnaire    Feeling of Stress: Only a little  Social Connections: Moderately Isolated (10/05/2024)   Social Connection and Isolation Panel    Frequency of Communication with Friends and Family: Once a week     Frequency of Social Gatherings with Friends and Family: Never    Attends Religious Services: 1 to 4 times per year    Active Member of [employer] or Organizations: No    Attends Engineer, Structural: Not on file    Marital Status: Married  Catering Manager Violence: Not At Risk (09/08/2023)   Humiliation, Afraid, Rape, and Kick questionnaire    Fear of Current or Ex-Partner: No    Emotionally Abused: No    Physically Abused: No    Sexually Abused: No  Depression (PHQ2-9): Low Risk (10/07/2024)   Depression (PHQ2-9)    PHQ-2 Score: 0  Alcohol Screen: Low Risk (10/05/2024)   Alcohol Screen    Last Alcohol Screening Score (AUDIT): 1  Housing: Unknown (10/05/2024)   Epic    Unable to Pay for Housing in the Last Year: No    Number of Times Moved in the Last Year: Not on file    Homeless in the Last Year: No  Utilities: Low Risk (07/16/2024)   Received from [facility]   Utilities    In the past 12 months has the electric, gas, oil, or water company threatened to shut off services in your home? : No  Health Literacy: Adequate Health Literacy (04/30/2023)   B1300 Health Literacy    Frequency of need for help with medical instructions: Never    Family History  Problem Relation Age of Onset   Breast cancer Mother    Hip fracture Mother    Ulcers Father    CVA Brother    Renal cancer Brother    Other Brother        hip replacement   CVA Brother        Had PaceMaker   Crohn's disease Brother    Diabetes Brother      Review of Systems  Constitutional: Negative.  Negative for chills and fever.  HENT: Negative.  Negative for congestion and sore throat.   Respiratory: Negative.  Negative for cough and shortness of breath.   Cardiovascular: Negative.  Negative for chest pain and palpitations.  Gastrointestinal:  Positive for  heartburn and nausea.  Genitourinary: Negative.   Musculoskeletal: Negative.   Skin: Negative.  Negative for rash.  Neurological: Negative.  Negative for  dizziness and headaches.  All other systems reviewed and are negative.   Vitals:   10/07/24 0900  BP: 116/60  Pulse: 89  Temp: 98 F (36.7 C)  SpO2: 92%    Physical Exam Vitals reviewed.  Constitutional:      Appearance: Normal appearance.  HENT:     Head: Normocephalic.     Mouth/Throat:     Mouth: Mucous membranes are moist.     Pharynx: Oropharynx is clear.  Eyes:     Extraocular Movements: Extraocular movements intact.     Conjunctiva/sclera: Conjunctivae normal.     Pupils: Pupils are equal, round, and reactive to light.  Cardiovascular:     Rate and Rhythm: Normal rate and regular rhythm.     Pulses: Normal pulses.     Heart sounds: Normal heart sounds.  Pulmonary:     Effort: Pulmonary effort is normal.     Breath sounds: Normal breath sounds.  Musculoskeletal:     Cervical back: No tenderness.  Lymphadenopathy:     Cervical: No cervical adenopathy.  Skin:    General: Skin is warm and dry.     Capillary Refill: Capillary refill takes less than 2 seconds.  Neurological:     Mental Status: She is alert and oriented to person, place, and time.  Psychiatric:        Mood and Affect: Mood normal.        Behavior: Behavior normal.      ASSESSMENT & PLAN: A total of 43 minutes was spent with the patient and counseling/coordination of care regarding preparing for this visit, review of most recent office visit notes, review of most recent cardiologist office visit notes, review of most recent emergency department visit notes, review of multiple chronic medical conditions and their management, review of all medications, review of most recent bloodwork results, review of health maintenance items, education on nutrition, prognosis, documentation, and need for follow up.     Problem List Items Addressed This Visit       Cardiovascular and Mediastinum   Paroxysmal atrial fibrillation (HCC)    Well-controlled heart rate.  Sinus rhythm today. On long-term  anticoagulation with Eliquis  5 mg twice a day Continues beta-blocker with metoprolol  tartrate 50 mg twice a day No clinical bleeding episodes Fall precautions given        Essential hypertension - Primary   BP Readings from Last 3 Encounters:  10/07/24 116/60  10/01/24 (!) 174/90  09/21/24 132/75  Well Controlled hypertension Continue diltiazem  240 mg daily and losartan  100 mg daily Also taking hydrochlorothiazide  12.5 mg daily Also on metoprolol  tartrate 50 mg twice a day       Complete heart block (HCC)   Pacemaker in place. Doing well. No concerns       Chronic diastolic CHF (congestive heart failure) (HCC)   Clinically stable.  Normovolemic. No concerns identified today No findings of acute CHF        Digestive   GERD (gastroesophageal reflux disease)   Much better with pantoprazole  40 mg daily Needs to follow-up with GI doctor        Genitourinary   Chronic kidney disease, stage 3a (HCC)   Stable.  Advised to stay well-hydrated and avoid NSAIDs On losartan  100 mg daily for hypertension which will also help kidneys        Hematopoietic  and Hemostatic   Thrombophilia   Continues Eliquis  5 mg twice a day         Other   Dyslipidemia   Intolerant to statins. No known history of vascular disease. Will focus on lifestyle modification for now.       Current use of long term anticoagulation   Situational anxiety   Very stressed out over chronic medical conditions Some of the nausea secondary to anxiety Stress management and mental health discussed Recommend Xanax  0.5 mg as needed no more than twice a day New prescription sent to pharmacy of record today.      Relevant Medications   ALPRAZolam  (XANAX ) 0.5 MG tablet   Presence of heart assist device (HCC)   Stable.  No concerns.      Patient Instructions  Health Maintenance After Age 47 After age 90, you are at a higher risk for certain long-term diseases and infections as well as injuries from  falls. Falls are a major cause of broken bones and head injuries in people who are older than age 16. Getting regular preventive care can help to keep you healthy and well. Preventive care includes getting regular testing and making lifestyle changes as recommended by your health care provider. Talk with your health care provider about: Which screenings and tests you should have. A screening is a test that checks for a disease when you have no symptoms. A diet and exercise plan that is right for you. What should I know about screenings and tests to prevent falls? Screening and testing are the best ways to find a health problem early. Early diagnosis and treatment give you the best chance of managing medical conditions that are common after age 48. Certain conditions and lifestyle choices may make you more likely to have a fall. Your health care provider may recommend: Regular vision checks. Poor vision and conditions such as cataracts can make you more likely to have a fall. If you wear glasses, make sure to get your prescription updated if your vision changes. Medicine review. Work with your health care provider to regularly review all of the medicines you are taking, including over-the-counter medicines. Ask your health care provider about any side effects that may make you more likely to have a fall. Tell your health care provider if any medicines that you take make you feel dizzy or sleepy. Strength and balance checks. Your health care provider may recommend certain tests to check your strength and balance while standing, walking, or changing positions. Foot health exam. Foot pain and numbness, as well as not wearing proper footwear, can make you more likely to have a fall. Screenings, including: Osteoporosis screening. Osteoporosis is a condition that causes the bones to get weaker and break more easily. Blood pressure screening. Blood pressure changes and medicines to control blood pressure can make  you feel dizzy. Depression screening. You may be more likely to have a fall if you have a fear of falling, feel depressed, or feel unable to do activities that you used to do. Alcohol use screening. Using too much alcohol can affect your balance and may make you more likely to have a fall. Follow these instructions at home: Lifestyle Do not drink alcohol if: Your health care provider tells you not to drink. If you drink alcohol: Limit how much you have to: 0-1 drink a day for women. 0-2 drinks a day for men. Know how much alcohol is in your drink. In the [place] one drink equals one 12  oz bottle of beer (355 mL), one 5 oz glass of wine (148 mL), or one 1 oz glass of hard liquor (44 mL). Do not use any products that contain nicotine or tobacco. These products include cigarettes, chewing tobacco, and vaping devices, such as e-cigarettes. If you need help quitting, ask your health care provider. Activity  Follow a regular exercise program to stay fit. This will help you maintain your balance. Ask your health care provider what types of exercise are appropriate for you. If you need a cane or walker, use it as recommended by your health care provider. Wear supportive shoes that have nonskid soles. Safety  Remove any tripping hazards, such as rugs, cords, and clutter. Install safety equipment such as grab bars in bathrooms and safety rails on stairs. Keep rooms and walkways well-lit. General instructions Talk with your health care provider about your risks for falling. Tell your health care provider if: You fall. Be sure to tell your health care provider about all falls, even ones that seem minor. You feel dizzy, tiredness (fatigue), or off-balance. Take over-the-counter and prescription medicines only as told by your health care provider. These include supplements. Eat a healthy diet and maintain a healthy weight. A healthy diet includes low-fat dairy products, low-fat (lean) meats, and fiber  from whole grains, beans, and lots of fruits and vegetables. Stay current with your vaccines. Schedule regular health, dental, and eye exams. Summary Having a healthy lifestyle and getting preventive care can help to protect your health and wellness after age 14. Screening and testing are the best way to find a health problem early and help you avoid having a fall. Early diagnosis and treatment give you the best chance for managing medical conditions that are more common for people who are older than age 51. Falls are a major cause of broken bones and head injuries in people who are older than age 91. Take precautions to prevent a fall at home. Work with your health care provider to learn what changes you can make to improve your health and wellness and to prevent falls. This information is not intended to replace advice given to you by your health care provider. Make sure you discuss any questions you have with your health care provider. Document Revised: 01/30/2021 Document Reviewed: 01/30/2021 Elsevier Patient Education  2024 Elsevier Inc.     Emil Schaumann, MD [facility]    [1]  Allergies Allergen Reactions   Codeine Nausea And Vomiting and Other (See Comments)    GI upset    Fosamax [Alendronate Sodium] Other (See Comments)    Tooth problems   Lisinopril Cough   Olmesartan Other (See Comments)    Possible photodermatitis    Amlodipine Swelling    edema

## 2024-10-07 NOTE — Assessment & Plan Note (Signed)
 Stable.  No concerns.

## 2024-10-07 NOTE — Assessment & Plan Note (Signed)
 Stable.  Advised to stay well-hydrated and avoid NSAIDs On losartan  100 mg daily for hypertension which will also help kidneys

## 2024-10-07 NOTE — Assessment & Plan Note (Addendum)
 BP Readings from Last 3 Encounters:  10/07/24 116/60  10/01/24 (!) 174/90  09/21/24 132/75  Well Controlled hypertension Continue diltiazem  240 mg daily and losartan  100 mg daily Also taking hydrochlorothiazide  12.5 mg daily Also on metoprolol  tartrate 50 mg twice a day

## 2024-10-07 NOTE — Patient Instructions (Signed)
 Health Maintenance After Age 83 After age 27, you are at a higher risk for certain long-term diseases and infections as well as injuries from falls. Falls are a major cause of broken bones and head injuries in people who are older than age 73. Getting regular preventive care can help to keep you healthy and well. Preventive care includes getting regular testing and making lifestyle changes as recommended by your health care provider. Talk with your health care provider about: Which screenings and tests you should have. A screening is a test that checks for a disease when you have no symptoms. A diet and exercise plan that is right for you. What should I know about screenings and tests to prevent falls? Screening and testing are the best ways to find a health problem early. Early diagnosis and treatment give you the best chance of managing medical conditions that are common after age 90. Certain conditions and lifestyle choices may make you more likely to have a fall. Your health care provider may recommend: Regular vision checks. Poor vision and conditions such as cataracts can make you more likely to have a fall. If you wear glasses, make sure to get your prescription updated if your vision changes. Medicine review. Work with your health care provider to regularly review all of the medicines you are taking, including over-the-counter medicines. Ask your health care provider about any side effects that may make you more likely to have a fall. Tell your health care provider if any medicines that you take make you feel dizzy or sleepy. Strength and balance checks. Your health care provider may recommend certain tests to check your strength and balance while standing, walking, or changing positions. Foot health exam. Foot pain and numbness, as well as not wearing proper footwear, can make you more likely to have a fall. Screenings, including: Osteoporosis screening. Osteoporosis is a condition that causes  the bones to get weaker and break more easily. Blood pressure screening. Blood pressure changes and medicines to control blood pressure can make you feel dizzy. Depression screening. You may be more likely to have a fall if you have a fear of falling, feel depressed, or feel unable to do activities that you used to do. Alcohol  use screening. Using too much alcohol  can affect your balance and may make you more likely to have a fall. Follow these instructions at home: Lifestyle Do not drink alcohol  if: Your health care provider tells you not to drink. If you drink alcohol : Limit how much you have to: 0-1 drink a day for women. 0-2 drinks a day for men. Know how much alcohol  is in your drink. In the [place] one drink equals one 12 oz bottle of beer (355 mL), one 5 oz glass of wine (148 mL), or one 1 oz glass of hard liquor (44 mL). Do not use any products that contain nicotine or tobacco. These products include cigarettes, chewing tobacco, and vaping devices, such as e-cigarettes. If you need help quitting, ask your health care provider. Activity  Follow a regular exercise program to stay fit. This will help you maintain your balance. Ask your health care provider what types of exercise are appropriate for you. If you need a cane or walker, use it as recommended by your health care provider. Wear supportive shoes that have nonskid soles. Safety  Remove any tripping hazards, such as rugs, cords, and clutter. Install safety equipment such as grab bars in bathrooms and safety rails on stairs. Keep rooms and walkways  well-lit. General instructions Talk with your health care provider about your risks for falling. Tell your health care provider if: You fall. Be sure to tell your health care provider about all falls, even ones that seem minor. You feel dizzy, tiredness (fatigue), or off-balance. Take over-the-counter and prescription medicines only as told by your health care provider. These include  supplements. Eat a healthy diet and maintain a healthy weight. A healthy diet includes low-fat dairy products, low-fat (lean) meats, and fiber from whole grains, beans, and lots of fruits and vegetables. Stay current with your vaccines. Schedule regular health, dental, and eye exams. Summary Having a healthy lifestyle and getting preventive care can help to protect your health and wellness after age 15. Screening and testing are the best way to find a health problem early and help you avoid having a fall. Early diagnosis and treatment give you the best chance for managing medical conditions that are more common for people who are older than age 42. Falls are a major cause of broken bones and head injuries in people who are older than age 64. Take precautions to prevent a fall at home. Work with your health care provider to learn what changes you can make to improve your health and wellness and to prevent falls. This information is not intended to replace advice given to you by your health care provider. Make sure you discuss any questions you have with your health care provider. Document Revised: 01/30/2021 Document Reviewed: 01/30/2021 Elsevier Patient Education  2024 [employer].

## 2024-10-07 NOTE — Assessment & Plan Note (Signed)
 Intolerant to statins.  No known history of vascular disease. Will focus on lifestyle modification for now.

## 2024-10-07 NOTE — Assessment & Plan Note (Signed)
 Very stressed out over chronic medical conditions Some of the nausea secondary to anxiety Stress management and mental health discussed Recommend Xanax  0.5 mg as needed no more than twice a day New prescription sent to pharmacy of record today.

## 2024-10-07 NOTE — Assessment & Plan Note (Signed)
 Clinically stable.  Normovolemic. No concerns identified today No findings of acute CHF

## 2024-10-07 NOTE — Assessment & Plan Note (Signed)
Continues Eliquis 5 mg twice a day

## 2024-10-07 NOTE — Assessment & Plan Note (Signed)
" °  Well-controlled heart rate.  Sinus rhythm today. On long-term anticoagulation with Eliquis  5 mg twice a day Continues beta-blocker with metoprolol  tartrate 50 mg twice a day No clinical bleeding episodes Fall precautions given   "

## 2024-10-07 NOTE — Assessment & Plan Note (Signed)
 Much better with pantoprazole  40 mg daily Needs to follow-up with GI doctor

## 2024-10-07 NOTE — Assessment & Plan Note (Signed)
Pacemaker in place.  Doing well.  No concerns.

## 2024-10-09 ENCOUNTER — Encounter: Payer: Self-pay | Admitting: Obstetrics and Gynecology

## 2024-10-09 ENCOUNTER — Ambulatory Visit: Admitting: Obstetrics and Gynecology

## 2024-10-09 VITALS — BP 143/72 | HR 72

## 2024-10-09 DIAGNOSIS — N813 Complete uterovaginal prolapse: Secondary | ICD-10-CM

## 2024-10-09 DIAGNOSIS — N993 Prolapse of vaginal vault after hysterectomy: Secondary | ICD-10-CM

## 2024-10-09 DIAGNOSIS — Z466 Encounter for fitting and adjustment of urinary device: Secondary | ICD-10-CM

## 2024-10-09 MED ORDER — ESTRADIOL 0.01 % VA CREA: 0.5000 g | TOPICAL_CREAM | VAGINAL | 11 refills | Status: AC

## 2024-10-09 NOTE — Patient Instructions (Signed)
 Please use the estrogen cream nightly for two weeks and then twice a week after. You will use a blueberry sized amount onto the finger and insert vaginally.

## 2024-10-09 NOTE — Progress Notes (Signed)
  Urogynecology   Subjective:     Chief Complaint: Pessary fitting Lisa [name] is a 84 y.o. female is here for pessary fitting.)  History of Present Illness: Lisa [name] is a 84 y.o. female with stage IV pelvic organ prolapse who presents today for a pessary fitting.    Past Medical History: Patient  has a past medical history of Bundle branch block, left, Chronic kidney disease (CKD), Diabetes mellitus without complication (HCC), Dyslipidemia, GERD (gastroesophageal reflux disease), Headache, Hypercholesteremia, Hypertension, LBBB (left bundle branch block), Neoplasm of breast, primary tumor staging category tis: ductal carcinoma in situ (DCIS), left, Pacemaker, Paroxysmal atrial fibrillation (HCC), and UTI (urinary tract infection).   Past Surgical History: She  has a past surgical history that includes Cesarean section; bladder polyp removal; Cardioversion (N/A, 07/30/2018); Breast excisional biopsy (Right); Breast lumpectomy (Left); CARDIOVERSION (N/A, 11/28/2021); PACEMAKER IMPLANT (N/A, 12/04/2021); CARDIOVERSION (N/A, 03/13/2024); ATRIAL FIBRILLATION ABLATION (N/A, 05/28/2024); Abdominal hysterectomy; and Cataract extraction.   Medications: She has a current medication list which includes the following prescription(s): alprazolam , apixaban , vitamin d3, diltiazem , hydrochlorothiazide , losartan , metoprolol  tartrate, olopatadine hcl, pantoprazole , and rosuvastatin .   Allergies: Patient is allergic to codeine, fosamax [alendronate sodium], lisinopril, olmesartan, and amlodipine.   Social History: Patient  reports that she quit smoking about 33 years ago. Her smoking use included cigarettes. She started smoking about 53 years ago. She has never used smokeless tobacco. She reports current alcohol use. She reports that she does not use drugs.      Objective:    BP (!) 143/72   Pulse 72  Gen: No apparent distress, A&O x 3. Pelvic Exam: Normal external female  genitalia; Bartholin's and Skene's glands normal in appearance; urethral meatus with caruncle, no urethral masses or discharge.   A size #4 short stem gellhorn pessary (Lot [id number]) was fitted. It was comfortable, stayed in place with valsalva and was an appropriate size on examination, with one finger fitting between the pessary and the vaginal walls. We tied a string to it and the patient demonstrated proper removal and replacement.    Assessment/Plan:    Assessment: Lisa [name] is a 84 y.o. with stage IV pelvic organ prolapse who presents for a pessary fitting. Plan: She was fitted with a #4 short stem gellhorn pessary. She will keep the pessary in place until next visit. She will use estrogen.   Prescription for estrogen cream sent into the pharmacy. Patient has vaginal atrophy on exam. She would benefit from estrogen cream. Patient to use a blueberry sized amount into the vagina. She may use this nightly for 2 weeks and then twice weekly after. We discussed using her finger instead of using the applicator.    Follow-up in 3 weeks for a pessary check or sooner as needed.  All questions were answered.    Azari Janssens G Ajah Vanhoose, NP

## 2024-10-12 ENCOUNTER — Telehealth: Payer: Self-pay | Admitting: Emergency Medicine

## 2024-10-12 DIAGNOSIS — R110 Nausea: Secondary | ICD-10-CM

## 2024-10-12 NOTE — Telephone Encounter (Unsigned)
 Copied from CRM #[id number]. Topic: Clinical - Medication Refill >> Oct 12, 2024  4:03 PM Shereese L wrote: Medication:  ondansetron  (ZOFRAN ) 4 MG tablet  Has the patient contacted their pharmacy? Yes (Agent: If no, request that the patient contact the pharmacy for the refill. If patient does not wish to contact the pharmacy document the reason why and proceed with request.) (Agent: If yes, when and what did the pharmacy advise?)  This is the patient's preferred pharmacy:  [facility] [address], [place] - [address] [place] [address] Phone: [phone] Fax: [phone]  Is this the correct pharmacy for this prescription? Yes If no, delete pharmacy and type the correct one.   Has the prescription been filled recently? Yes  Is the patient out of the medication? Yes  Has the patient been seen for an appointment in the last year OR does the patient have an upcoming appointment? Yes  Can we respond through MyChart? Yes  Agent: Please be advised that Rx refills may take up to 3 business days. We ask that you follow-up with your pharmacy.

## 2024-10-13 MED ORDER — ONDANSETRON 4 MG PO TBDP: 4.0000 mg | ORAL_TABLET | Freq: Three times a day (TID) | ORAL | 0 refills | Status: AC | PRN

## 2024-10-13 NOTE — Telephone Encounter (Signed)
 This is not on patient medication list. Patient is requesting a medication refill for zofran 

## 2024-10-13 NOTE — Telephone Encounter (Signed)
 I have sent in a new prescription for patient

## 2024-10-13 NOTE — Telephone Encounter (Signed)
 Okay to prescribe Zofran 

## 2024-10-27 ENCOUNTER — Encounter: Payer: Self-pay | Admitting: Cardiology

## 2024-10-28 MED ORDER — LOSARTAN POTASSIUM 100 MG PO TABS: 100.0000 mg | ORAL_TABLET | Freq: Every day | ORAL | 2 refills | Status: AC

## 2024-10-28 MED ORDER — APIXABAN 5 MG PO TABS: 5.0000 mg | ORAL_TABLET | Freq: Two times a day (BID) | ORAL | 3 refills | Status: AC

## 2024-10-28 MED ORDER — HYDROCHLOROTHIAZIDE 12.5 MG PO CAPS: 12.5000 mg | ORAL_CAPSULE | Freq: Every day | ORAL | 2 refills | Status: AC

## 2024-10-28 MED ORDER — METOPROLOL TARTRATE 50 MG PO TABS: 50.0000 mg | ORAL_TABLET | Freq: Two times a day (BID) | ORAL | 2 refills | Status: AC

## 2024-11-16 ENCOUNTER — Ambulatory Visit: Admitting: Obstetrics and Gynecology

## 2024-12-10 ENCOUNTER — Ambulatory Visit: Admitting: Cardiology

## 2024-12-18 ENCOUNTER — Ambulatory Visit: Admitting: Cardiology

## 2025-03-01 ENCOUNTER — Ambulatory Visit (HOSPITAL_COMMUNITY): Admitting: Physician Assistant

## 2025-04-06 ENCOUNTER — Ambulatory Visit: Admitting: Emergency Medicine
